# Patient Record
Sex: Female | Born: 1940 | Race: White | Hispanic: No | Marital: Married | Smoking: Never smoker
Health system: Southern US, Community
[De-identification: ages and names within clinical notes are randomized; demographics above are authoritative.]

## PROBLEM LIST (undated history)

## (undated) DIAGNOSIS — G20 Parkinson's disease: Principal | ICD-10-CM

## (undated) DIAGNOSIS — G20A1 Parkinson's disease without dyskinesia, without mention of fluctuations: Secondary | ICD-10-CM

## (undated) DIAGNOSIS — R609 Edema, unspecified: Secondary | ICD-10-CM

## (undated) DIAGNOSIS — M4800 Spinal stenosis, site unspecified: Secondary | ICD-10-CM

## (undated) DIAGNOSIS — R5383 Other fatigue: Secondary | ICD-10-CM

## (undated) DIAGNOSIS — N12 Tubulo-interstitial nephritis, not specified as acute or chronic: Secondary | ICD-10-CM

## (undated) DIAGNOSIS — I2789 Other specified pulmonary heart diseases: Secondary | ICD-10-CM

## (undated) DIAGNOSIS — R269 Unspecified abnormalities of gait and mobility: Secondary | ICD-10-CM

## (undated) DIAGNOSIS — M25619 Stiffness of unspecified shoulder, not elsewhere classified: Secondary | ICD-10-CM

## (undated) DIAGNOSIS — N189 Chronic kidney disease, unspecified: Secondary | ICD-10-CM

## (undated) DIAGNOSIS — G4730 Sleep apnea, unspecified: Secondary | ICD-10-CM

## (undated) HISTORY — DX: Unspecified abnormalities of gait and mobility: R26.9

## (undated) HISTORY — DX: Edema, unspecified: R60.9

## (undated) HISTORY — PX: ABDOMINAL HYSTERECTOMY: SHX81

## (undated) HISTORY — DX: Other specified pulmonary heart diseases: I27.89

## (undated) HISTORY — DX: Parkinson's disease without dyskinesia, without mention of fluctuations: G20.A1

## (undated) HISTORY — DX: Spinal stenosis, site unspecified: M48.00

## (undated) HISTORY — DX: Stiffness of unspecified shoulder, not elsewhere classified: M25.619

## (undated) HISTORY — DX: Parkinson's disease: G20

## (undated) HISTORY — DX: Other fatigue: R53.83

## (undated) HISTORY — PX: COLONOSCOPY: SHX174

---

## 2010-12-03 ENCOUNTER — Ambulatory Visit
Admission: RE | Admit: 2010-12-03 | Discharge: 2010-12-03 | Payer: Self-pay | Source: Home / Self Care | Attending: Otolaryngology | Admitting: Otolaryngology

## 2010-12-31 ENCOUNTER — Ambulatory Visit
Admission: RE | Admit: 2010-12-31 | Discharge: 2010-12-31 | Payer: Self-pay | Source: Home / Self Care | Attending: Otolaryngology | Admitting: Otolaryngology

## 2011-03-01 ENCOUNTER — Other Ambulatory Visit: Payer: Self-pay | Admitting: Neurology

## 2011-03-01 DIAGNOSIS — R259 Unspecified abnormal involuntary movements: Secondary | ICD-10-CM

## 2011-03-02 ENCOUNTER — Ambulatory Visit
Admission: RE | Admit: 2011-03-02 | Discharge: 2011-03-02 | Disposition: A | Discharge status: Home / Self Care | Payer: Medicare Other | Source: Ambulatory Visit | Attending: Neurology | Admitting: Neurology

## 2011-03-02 DIAGNOSIS — R259 Unspecified abnormal involuntary movements: Secondary | ICD-10-CM

## 2011-03-04 ENCOUNTER — Ambulatory Visit (INDEPENDENT_AMBULATORY_CARE_PROVIDER_SITE_OTHER): Payer: Medicare Other | Admitting: Otolaryngology

## 2011-03-04 DIAGNOSIS — K121 Other forms of stomatitis: Secondary | ICD-10-CM

## 2011-03-04 DIAGNOSIS — J310 Chronic rhinitis: Secondary | ICD-10-CM

## 2011-03-04 DIAGNOSIS — K1230 Oral mucositis (ulcerative), unspecified: Secondary | ICD-10-CM

## 2012-01-05 DIAGNOSIS — R609 Edema, unspecified: Secondary | ICD-10-CM

## 2012-01-17 ENCOUNTER — Encounter: Payer: Self-pay | Admitting: Cardiovascular Disease

## 2012-01-18 ENCOUNTER — Encounter: Payer: Self-pay | Admitting: Cardiovascular Disease

## 2012-01-18 ENCOUNTER — Ambulatory Visit (INDEPENDENT_AMBULATORY_CARE_PROVIDER_SITE_OTHER): Payer: Medicare Other | Admitting: Cardiovascular Disease

## 2012-01-18 VITALS — BP 149/79 | HR 76 | Ht 65.0 in | Wt 192.0 lb

## 2012-01-18 DIAGNOSIS — I498 Other specified cardiac arrhythmias: Secondary | ICD-10-CM

## 2012-01-18 DIAGNOSIS — R609 Edema, unspecified: Secondary | ICD-10-CM

## 2012-01-18 DIAGNOSIS — R001 Bradycardia, unspecified: Secondary | ICD-10-CM

## 2012-01-18 DIAGNOSIS — Z136 Encounter for screening for cardiovascular disorders: Secondary | ICD-10-CM

## 2012-01-18 NOTE — Progress Notes (Signed)
HPI  This is a 71 year old female who was referred by Dr. Dimas Aguas for evaluation of bradycardia and lower extremity edema. The patient has no previous cardiac history. She has no history of myocardial infarction or congestive heart failure. She has a prolonged history of Parkinson's disease with significant physical limitations related to that. She also has history of chronic back pain due to spinal stenosis which also significantly contributes to her physical limitations. She suffers from chronic spasm and jerky movements in her lower extremities which have been very difficult to deal with and treat. She has been tried on multiple medications but had side effects almost all of them. Most recently she was given Tizanidine which is a muscle relaxant. She took a few doses but yesterday after she took one dose her blood pressure dropped to systolic of 95. This was associated with confusion and slurring in her speech. She also had few episodes of bradycardia with heart rate in the lower 50s. She complains of occasional dizziness but there has been no syncope or presyncope. She has chronic lower extremity edema which is usually worse at the end of the day. She denies any chest pain or dyspnea.  Allergies  Allergen Reactions  . Erythromycin Diarrhea     Current Outpatient Prescriptions on File Prior to Visit  Medication Sig Dispense Refill  . Calcium Carbonate-Vitamin D (CALTRATE 600+D) 600-400 MG-UNIT per tablet Take 1 tablet by mouth daily.      . carbidopa-levodopa (SINEMET) 25-100 MG per tablet Take 1 tablet by mouth 4 (four) times daily.       . hydrochlorothiazide (MICROZIDE) 12.5 MG capsule Take 12.5 mg by mouth daily as needed.      Marland Kitchen HYDROcodone-acetaminophen (VICODIN) 5-500 MG per tablet Take 1 tablet by mouth every 8 (eight) hours as needed.       . magnesium oxide (MAG-OX) 400 MG tablet Take 400 mg by mouth daily.      . Multiple Vitamins-Minerals (CENTRUM SILVER PO) Take 1 tablet by mouth  daily.      . polyethylene glycol (MIRALAX / GLYCOLAX) packet Take 17 g by mouth daily.         Past Medical History  Diagnosis Date  . Edema   . Other chronic pulmonary heart diseases   . Paralysis agitans   . Stiffness of joint, not elsewhere classified,  shoulder region   . Abnormality of gait   . Fatigue   . Parkinson disease   . Spinal stenosis      History reviewed. No pertinent past surgical history.   History reviewed. No pertinent family history.   History   Social History  . Marital Status: Married    Spouse Name: Homero Fellers    Number of Children: N/A  . Years of Education: N/A   Occupational History  . RETIRED    Social History Main Topics  . Smoking status: Never Smoker   . Smokeless tobacco: Never Used  . Alcohol Use: No  . Drug Use: No  . Sexually Active: Not on file   Other Topics Concern  . Not on file   Social History Narrative  . No narrative on file     ROS Constitutional: Negative for fever, chills, diaphoresis, activity change, appetite change. HENT: Negative for hearing loss, nosebleeds, congestion, sore throat, facial swelling, drooling, trouble swallowing, neck pain, voice change, sinus pressure and tinnitus.  Eyes: Negative for photophobia, pain, discharge and visual disturbance.  Respiratory: Negative for apnea, cough, chest tightness, shortness of breath  and wheezing.  Cardiovascular: Negative for chest pain, palpitations.  Gastrointestinal: Negative for nausea, vomiting, abdominal pain, diarrhea, constipation, blood in stool and abdominal distention.  Genitourinary: Negative for dysuria, urgency, frequency, hematuria and decreased urine volume.   Skin: Negative for color change, pallor, rash and wound.  Neurological: Negative for  seizures, syncope, speech difficulty, weakness. Psychiatric/Behavioral: Negative for suicidal ideas, hallucinations, behavioral problems and agitation.     PHYSICAL EXAM   BP 149/79  Pulse 76  Ht 5'  5" (1.651 m)  Wt 192 lb (87.091 kg)  BMI 31.95 kg/m2  SpO2 96%  Constitutional: She is oriented to person, place, and time. She appears well-developed and well-nourished. No distress.  HENT: No nasal discharge.  Head: Normocephalic and atraumatic.  Eyes: Pupils are equal and round. Right eye exhibits no discharge. Left eye exhibits no discharge.  Neck: Normal range of motion. Neck supple. No JVD present. No thyromegaly present.  Cardiovascular: Normal rate, regular rhythm, normal heart sounds. Exam reveals no gallop and no friction rub. There is a 2/6 systolic ejection murmur at the base. Pulmonary/Chest: Effort normal and breath sounds normal. No stridor. No respiratory distress. She has no wheezes. She has no rales. She exhibits no tenderness.  Abdominal: Soft. Bowel sounds are normal. She exhibits no distension. There is no tenderness. There is no rebound and no guarding.  Musculoskeletal: Normal range of motion. She exhibits +2 edema and no tenderness.  Neurological: She is alert and oriented to person, place, and time. Coordination normal.  Skin: Skin is warm and dry. No rash noted. She is not diaphoretic. No erythema. No pallor.  Psychiatric: She has a normal mood and affect. Her behavior is normal. Judgment and thought content normal.    EKG: Sinus rhythm with significant motion artifact and occasional PACs. No significant ST or T wave changes.   ASSESSMENT AND PLAN

## 2012-01-18 NOTE — Assessment & Plan Note (Signed)
Her lower extremity edema is likely due to chronic venous insufficiency. I agree with support stockings and frequent leg elevations. She uses hydrochlorothiazide as needed. Can consider switching back to a small dose furosemide to see if it works better without dropping her blood pressure.

## 2012-01-18 NOTE — Patient Instructions (Signed)
Your follow up will be based on your test results.  Your physician recommends that you continue on your current medications as directed. Please refer to the Current Medication list given to you today. Your physician has recommended that you wear a holter monitor. Holter monitors are medical devices that record the heart's electrical activity. Doctors most often use these monitors to diagnose arrhythmias. Arrhythmias are problems with the speed or rhythm of the heartbeat. The monitor is a small, portable device. You can wear one while you do your normal daily activities. This is usually used to diagnose what is causing palpitations/syncope (passing out). If the results of your test are normal or stable, you will receive a letter. If they are abnormal, the nurse will contact you by phone.  

## 2012-01-18 NOTE — Assessment & Plan Note (Signed)
The patient had reported bradycardia in the low 50s with occasional dizziness. There is no history of syncope or presyncope. She is not bradycardic today. This could be reactive due to some of the medications that she takes especially the muscle relaxant. I will obtain a 48-hour Holter monitor for further evaluation. I also recommend checking her thyroid function and comprehensive metabolic profile if this has not been done recently. She did have an echocardiogram done which showed normal LV systolic function with only minor valvular abnormalities. She does not have symptoms suggestive of cardiac ischemia. Most of her complaints and physical limitations seems to be related to Parkinson's disease and back problems.

## 2012-01-25 ENCOUNTER — Encounter: Payer: Self-pay | Admitting: *Deleted

## 2012-02-29 ENCOUNTER — Other Ambulatory Visit: Payer: Self-pay | Admitting: Neurology

## 2012-02-29 DIAGNOSIS — IMO0002 Reserved for concepts with insufficient information to code with codable children: Secondary | ICD-10-CM

## 2012-02-29 DIAGNOSIS — M25559 Pain in unspecified hip: Secondary | ICD-10-CM

## 2012-03-03 ENCOUNTER — Ambulatory Visit
Admission: RE | Admit: 2012-03-03 | Discharge: 2012-03-03 | Disposition: A | Discharge status: Home / Self Care | Payer: Medicare Other | Source: Ambulatory Visit | Attending: Neurology | Admitting: Neurology

## 2012-03-03 DIAGNOSIS — IMO0002 Reserved for concepts with insufficient information to code with codable children: Secondary | ICD-10-CM

## 2012-03-03 DIAGNOSIS — M25559 Pain in unspecified hip: Secondary | ICD-10-CM

## 2012-03-07 ENCOUNTER — Other Ambulatory Visit: Payer: Medicare Other

## 2012-04-19 ENCOUNTER — Encounter (HOSPITAL_COMMUNITY): Payer: Self-pay

## 2012-04-19 ENCOUNTER — Emergency Department (HOSPITAL_COMMUNITY): Payer: Medicare Other

## 2012-04-19 ENCOUNTER — Emergency Department (HOSPITAL_COMMUNITY)
Admission: EM | Admit: 2012-04-19 | Discharge: 2012-04-19 | Disposition: A | Discharge status: Home / Self Care | Payer: Medicare Other | Attending: Emergency Medicine | Admitting: Emergency Medicine

## 2012-04-19 DIAGNOSIS — M79609 Pain in unspecified limb: Secondary | ICD-10-CM | POA: Insufficient documentation

## 2012-04-19 DIAGNOSIS — G20 Parkinson's disease: Secondary | ICD-10-CM | POA: Insufficient documentation

## 2012-04-19 DIAGNOSIS — G20A1 Parkinson's disease without dyskinesia, without mention of fluctuations: Secondary | ICD-10-CM | POA: Insufficient documentation

## 2012-04-19 DIAGNOSIS — Z79899 Other long term (current) drug therapy: Secondary | ICD-10-CM | POA: Insufficient documentation

## 2012-04-19 DIAGNOSIS — R29898 Other symptoms and signs involving the musculoskeletal system: Secondary | ICD-10-CM | POA: Insufficient documentation

## 2012-04-19 DIAGNOSIS — M545 Low back pain, unspecified: Secondary | ICD-10-CM | POA: Insufficient documentation

## 2012-04-19 DIAGNOSIS — R609 Edema, unspecified: Secondary | ICD-10-CM | POA: Insufficient documentation

## 2012-04-19 LAB — BASIC METABOLIC PANEL
BUN: 15 mg/dL (ref 6–23)
CO2: 29 mEq/L (ref 19–32)
Calcium: 9.7 mg/dL (ref 8.4–10.5)
Creatinine, Ser: 0.55 mg/dL (ref 0.50–1.10)
Glucose, Bld: 91 mg/dL (ref 70–99)

## 2012-04-19 LAB — CBC
MCH: 33 pg (ref 26.0–34.0)
MCV: 94.2 fL (ref 78.0–100.0)
Platelets: 192 10*3/uL (ref 150–400)
RDW: 12.5 % (ref 11.5–15.5)

## 2012-04-19 LAB — URINE MICROSCOPIC-ADD ON

## 2012-04-19 LAB — URINALYSIS, ROUTINE W REFLEX MICROSCOPIC
Bilirubin Urine: NEGATIVE
Glucose, UA: NEGATIVE mg/dL
Hgb urine dipstick: NEGATIVE
Ketones, ur: NEGATIVE mg/dL
Protein, ur: NEGATIVE mg/dL

## 2012-04-19 MED ORDER — DIAZEPAM 5 MG PO TABS
5.0000 mg | ORAL_TABLET | Freq: Once | ORAL | Status: AC
  Administered 2012-04-19: 5 mg via ORAL
  Filled 2012-04-19: qty 1

## 2012-04-19 MED ORDER — FENTANYL CITRATE 0.05 MG/ML IJ SOLN
50.0000 ug | Freq: Once | INTRAMUSCULAR | Status: AC
  Administered 2012-04-19: 50 ug via INTRAMUSCULAR
  Filled 2012-04-19: qty 2

## 2012-04-19 MED ORDER — ONDANSETRON 4 MG PO TBDP
4.0000 mg | ORAL_TABLET | Freq: Once | ORAL | Status: AC
  Administered 2012-04-19: 4 mg via ORAL
  Filled 2012-04-19: qty 1

## 2012-04-19 MED ORDER — DIAZEPAM 5 MG PO TABS: 5.0000 mg | ORAL_TABLET | Freq: Two times a day (BID) | ORAL | Status: AC

## 2012-04-19 NOTE — ED Provider Notes (Signed)
History     CSN: [id number]  Arrival date & time 04/19/12  1142   First MD Initiated Contact with Patient 04/19/12 1217      Chief Complaint  Patient presents with  . Extremity Weakness    (Consider location/radiation/quality/duration/timing/severity/associated sxs/prior treatment) HPI History from patient. 71yo F with PMH Parkinson disease who presents with multiple complaints. States that she has had intermittent "spasms" to her bilateral lower extremities for the past 2 years. Spasms are associated with intense cramping pain, which can last for up to 5 minutes at a time. States she has difficulty with walking at times due to the pain and "my legs feel like jelly." Pain worsens with movement. She has peripheral edema which has been ongoing for some time. Has been seen by her neurologist, Dr. Sandria Manly, and her PCP numerous times; has tried numerous medications for this with no relief. Currently taking hydrocodone for pain.  A review of the previous chart indicates that she has been evaluated for this with dopplers of the LEs which was negative. MR of the Lspine was performed which showed extensive spondylosis and sciatica with several areas of foraminal narrowing.  Past Medical History  Diagnosis Date  . Edema   . Other chronic pulmonary heart diseases   . Paralysis agitans   . Stiffness of joint, not elsewhere classified,  shoulder region   . Abnormality of gait   . Fatigue   . Parkinson disease   . Spinal stenosis     History reviewed. No pertinent past surgical history.  History reviewed. No pertinent family history.  History  Substance Use Topics  . Smoking status: Never Smoker   . Smokeless tobacco: Never Used  . Alcohol Use: No    OB History    Grav Para Term Preterm Abortions TAB SAB Ect Mult Living                  Review of Systems  Constitutional: Negative for fever, chills, activity change and appetite change.  Respiratory: Negative for chest tightness and  shortness of breath.   Cardiovascular: Negative for chest pain and palpitations.  Gastrointestinal: Negative for nausea, vomiting and abdominal pain.  Musculoskeletal: Negative for myalgias.  Skin: Negative for color change and rash.  Neurological: Negative for dizziness and weakness.  All other systems reviewed and are negative.    Allergies  Tizanidine and Erythromycin  Home Medications   Current Outpatient Rx  Name Route Sig Dispense Refill  . CALCIUM CARBONATE-VITAMIN D 600-400 MG-UNIT PO TABS Oral Take 1 tablet by mouth daily.    Marland Kitchen CARBIDOPA-LEVODOPA 25-100 MG PO TABS Oral Take 1 tablet by mouth 5 (five) times daily. 5am, 9am, noon, 3pm, 7pm    . VITAMIN D3 5000 UNITS PO CAPS Oral Take 1 capsule by mouth daily.    Marland Kitchen CITALOPRAM HYDROBROMIDE 10 MG PO TABS Oral Take 10 mg by mouth daily.    . OMEGA-3 FATTY ACIDS 1000 MG PO CAPS Oral Take 1 g by mouth daily.    . FUROSEMIDE 40 MG PO TABS Oral Take 40 mg by mouth every other day. Alternates with hctz    . HYDROCHLOROTHIAZIDE 12.5 MG PO CAPS Oral Take 12.5 mg by mouth every other day. Alternates with furosemide    . HYDROCODONE-ACETAMINOPHEN 5-500 MG PO TABS Oral Take 1 tablet by mouth every 8 (eight) hours as needed. For pain    . MAGNESIUM OXIDE 400 MG PO TABS Oral Take 400 mg by mouth daily.    Marland Kitchen  CENTRUM SILVER PO Oral Take 1 tablet by mouth daily.    Marland Kitchen POLYETHYLENE GLYCOL 3350 PO PACK Oral Take 17 g by mouth daily.    . TRAMADOL HCL 50 MG PO TABS Oral Take 50 mg by mouth 3 (three) times daily as needed. For pain    . TRIHEXYPHENIDYL HCL 2 MG PO TABS Oral Take 1 mg by mouth 3 (three) times daily with meals.    Marland Kitchen VITAMIN E 400 UNITS PO CAPS Oral Take 400 Units by mouth daily.      BP 143/59  Pulse 61  Temp(Src) 98.2 F (36.8 C) (Oral)  Resp 18  Physical Exam  Nursing note and vitals reviewed. Constitutional: She is oriented to person, place, and time. She appears well-developed and well-nourished. No distress.  HENT:    Head: Normocephalic and atraumatic.  Neck: Normal range of motion. Neck supple.  Cardiovascular: Normal rate, regular rhythm and normal heart sounds.   Pulmonary/Chest: Effort normal and breath sounds normal. She exhibits no tenderness.  Abdominal: Soft. Bowel sounds are normal. There is no tenderness. There is no rebound and no guarding.  Musculoskeletal: Normal range of motion.       No muscle spasm appreciated in legs or tenderness to palpation of legs. No palpable cord or pain with dorsiflexion.   Neurological: She is alert and oriented to person, place, and time. She has normal strength. No cranial nerve deficit or sensory deficit. She exhibits normal muscle tone. Coordination normal.  Reflex Scores:      Achilles reflexes are 1+ on the right side and 1+ on the left side. Skin: Skin is warm and dry. She is not diaphoretic.       Chronic edema and skin changes consistent with venous insufficiency  Psychiatric: She has a normal mood and affect.    ED Course  Procedures (including critical care time)  Labs Reviewed  URINALYSIS, ROUTINE W REFLEX MICROSCOPIC - Abnormal; Notable for the following:    Leukocytes, UA SMALL (*)    All other components within normal limits  BASIC METABOLIC PANEL  CBC  URINE MICROSCOPIC-ADD ON  URINE CULTURE   Ct Head Wo Contrast  04/19/2012  *RADIOLOGY REPORT*  Clinical Data: Bilateral leg weakness and pain.  No headache.  CT HEAD WITHOUT CONTRAST  Technique:  Contiguous axial images were obtained from the base of the skull through the vertex without contrast.  Comparison: None  Findings: Ventricle size is normal.  Negative for hemorrhage. Small hypodensity in the left frontal white matter, most likely related to chronic microvascular ischemia.  No acute infarct. Negative for mass lesion.  Calvarium is intact.  IMPRESSION: Small hypodensity left frontal white matter, likely an area of chronic ischemia.  No acute abnormality.  Original Report Authenticated  By: Camelia Phenes, M.D.   Gna Rad Results  04/18/2012  Ordered by an unspecified provider.     1. Low back pain radiating to both legs       MDM  Pt with bilateral pain and "spasm" to lower extremities. Has been seen by his PCP and neurologist and has tried several medications with no relief. Basic labs unremarkable here. CT head negative. Old chart examined which indicates extensive spondylosis with both right and left foraminal narrowing, so this certainly may represent radicular pain as she states it often gets worse with movements. She was given 1 dose Fentanyl and Valium here and stated she felt better with this. She was provided with referrals to pain management both in  [name] where she lives as well as at [facility]. Emphasized the importance of f/u with them. She hasn't tried Valium before so will give her a prescription for this; advised that this can raise risk of falls so recommended starting with 1/2 pill at first. She and family at bedside verbalized understanding, agreed to plan.  Plan d/w Dr. Clarene Duke.        Grant Fontana, [place] 04/19/12 [phone]

## 2012-04-19 NOTE — Discharge Instructions (Signed)
Your lab evaluation and CT here were normal. You have been given a prescription for Valium, a muscle relaxer. Please start with half a pill at a time. You may advance up to a whole pill once you see how the medication affects you. It is important that you follow up with pain management, as your pain may be coming from your back. Please see the attached referral information. Follow up with Dr. Sandria Manly as well. Return to the ED for worsening condition or other concerns.  Back Pain, Adult Low back pain is very common. About 1 in 5 people have back pain.The cause of low back pain is rarely dangerous. The pain often gets better over time.About half of people with a sudden onset of back pain feel better in just 2 weeks. About 8 in 10 people feel better by 6 weeks.  CAUSES Some common causes of back pain include:  Strain of the muscles or ligaments supporting the spine.   Wear and tear (degeneration) of the spinal discs.   Arthritis.   Direct injury to the back.  DIAGNOSIS Most of the time, the direct cause of low back pain is not known.However, back pain can be treated effectively even when the exact cause of the pain is unknown.Answering your caregiver's questions about your overall health and symptoms is one of the most accurate ways to make sure the cause of your pain is not dangerous. If your caregiver needs more information, he or she may order lab work or imaging tests (X-rays or MRIs).However, even if imaging tests show changes in your back, this usually does not require surgery. HOME CARE INSTRUCTIONS For many people, back pain returns.Since low back pain is rarely dangerous, it is often a condition that people can learn to [facility] their own.   Remain active. It is stressful on the back to sit or stand in one place. Do not sit, drive, or stand in one place for more than 30 minutes at a time. Take short walks on level surfaces as soon as pain allows.Try to increase the length of time you  walk each day.   Do not stay in bed.Resting more than 1 or 2 days can delay your recovery.   Do not avoid exercise or work.Your body is made to move.It is not dangerous to be active, even though your back may hurt.Your back will likely heal faster if you return to being active before your pain is gone.   Pay attention to your body when you bend and lift. Many people have less discomfortwhen lifting if they bend their knees, keep the load close to their bodies,and avoid twisting. Often, the most comfortable positions are those that put less stress on your recovering back.   Find a comfortable position to sleep. Use a firm mattress and lie on your side with your knees slightly bent. If you lie on your back, put a pillow under your knees.   Only take over-the-counter or prescription medicines as directed by your caregiver. Over-the-counter medicines to reduce pain and inflammation are often the most helpful.Your caregiver may prescribe muscle relaxant drugs.These medicines help dull your pain so you can more quickly return to your normal activities and healthy exercise.   Put ice on the injured area.   Put ice in a plastic bag.   Place a towel between your skin and the bag.   Leave the ice on for 15 to 20 minutes, 3 to 4 times a day for the first 2 to 3 days.  After that, ice and heat may be alternated to reduce pain and spasms.   Ask your caregiver about trying back exercises and gentle massage. This may be of some benefit.   Avoid feeling anxious or stressed.Stress increases muscle tension and can worsen back pain.It is important to recognize when you are anxious or stressed and learn ways to manage it.Exercise is a great option.  SEEK MEDICAL CARE IF:  You have pain that is not relieved with rest or medicine.   You have pain that does not improve in 1 week.   You have new symptoms.   You are generally not feeling well.  SEEK IMMEDIATE MEDICAL CARE IF:   You have pain that  radiates from your back into your legs.   You develop new bowel or bladder control problems.   You have unusual weakness or numbness in your arms or legs.   You develop nausea or vomiting.   You develop abdominal pain.   You feel faint.  Document Released: 11/22/2005 Document Revised: 11/11/2011 Document Reviewed: 04/12/2011 [facility] Patient Information 2012 [place], [place].

## 2012-04-19 NOTE — ED Notes (Signed)
Ongoing leg weakness sts legs feel like jelly, shaking and complains of fluid pockets behind knees

## 2012-04-19 NOTE — ED Notes (Signed)
Patient stated she is unable to change position to do orthostatic vital signs

## 2012-04-21 NOTE — ED Provider Notes (Signed)
Medical screening examination/treatment/procedure(s) were performed by non-physician practitioner and as supervising physician I was immediately available for consultation/collaboration.   Cedricka Sackrider M Dhilan Brauer, DO 04/21/12 2339 

## 2012-04-22 LAB — URINE CULTURE: Culture  Setup Time: 201305151619

## 2012-04-23 NOTE — ED Notes (Signed)
+   urine culture. Chart sent to EDP office for review 

## 2012-04-25 NOTE — ED Notes (Signed)
Rx for Cipro 250 mg po BID x 7 days called to [facility] ,[facility]  [phone] written by Trixie Dredge

## 2013-02-13 IMAGING — CT CT HEAD W/O CM
1 of 2 series · 16 of 30 positions shown, 20 images · non-contrast
Comparison: None

CLINICAL DATA: Bilateral leg weakness and pain.  No headache.

CT HEAD WITHOUT CONTRAST
TECHNIQUE: Contiguous axial images were obtained from the base of
the skull through the vertex without contrast.

[Series 2: (id) head 4.8 h37s st · axial · 0.47mm/px · z∈[+1244,+1396]mm · 16 of 36 slices shown, 20 images]
[im 2/36  brain]
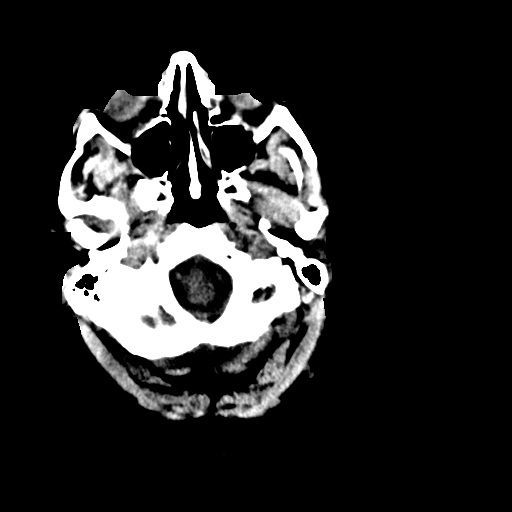
[im 2/36  bone]
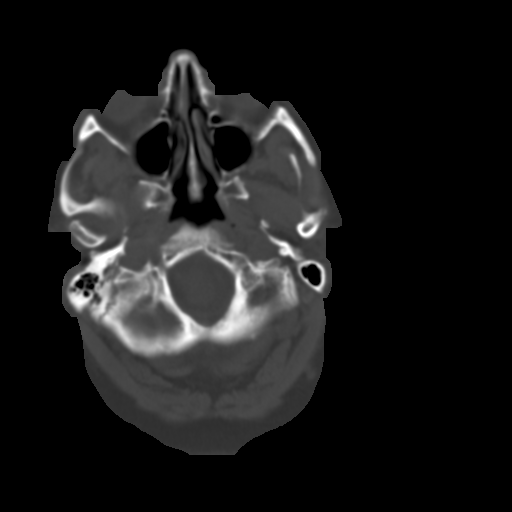
[im 4/36  brain]
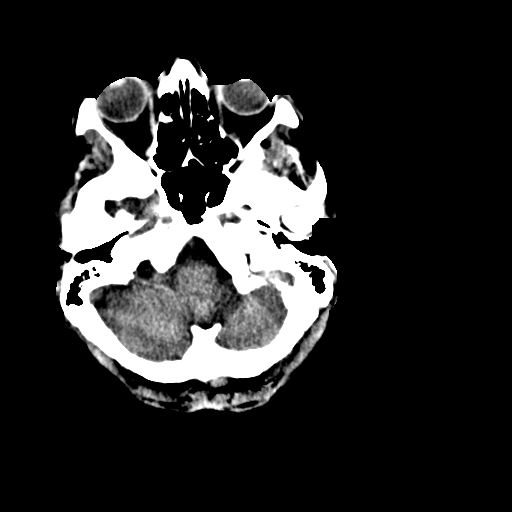
[im 7/36  brain]
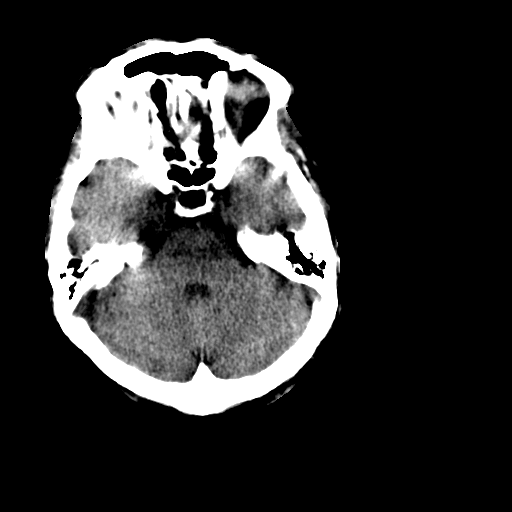
[im 9/36  brain]
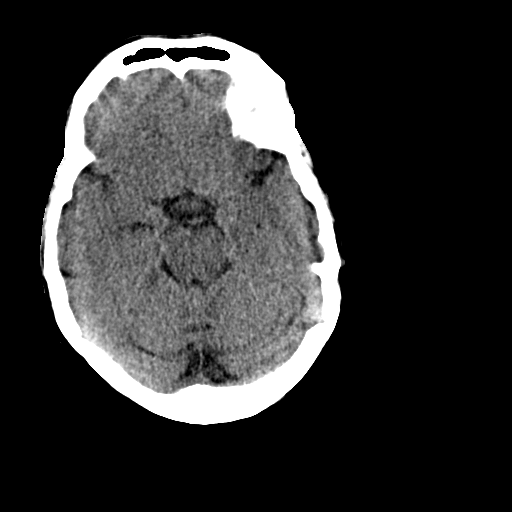
[im 11/36  brain]
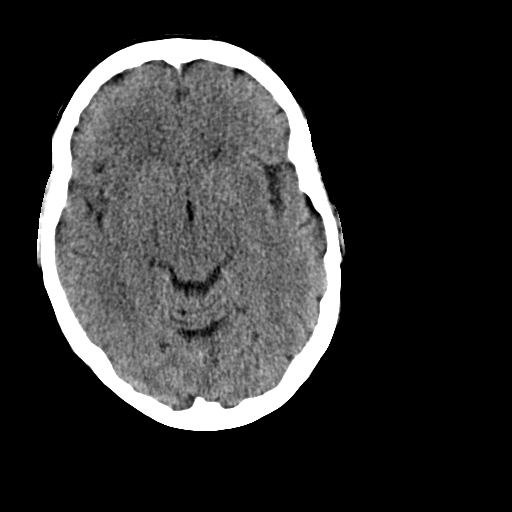
[im 11/36  bone]
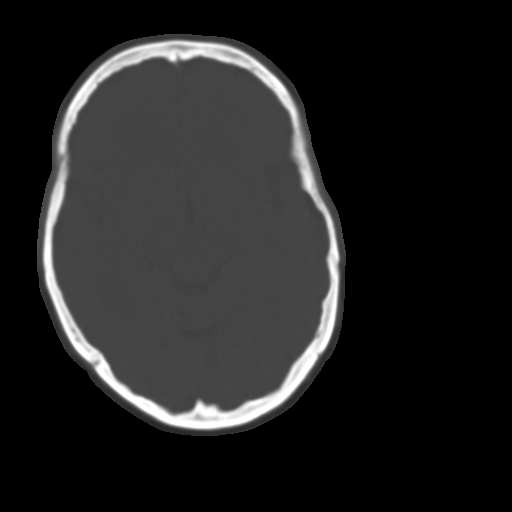
[im 12/36  brain]
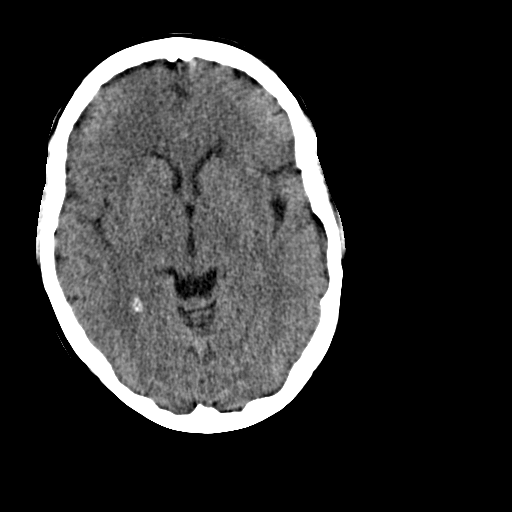
[im 16/36  brain]
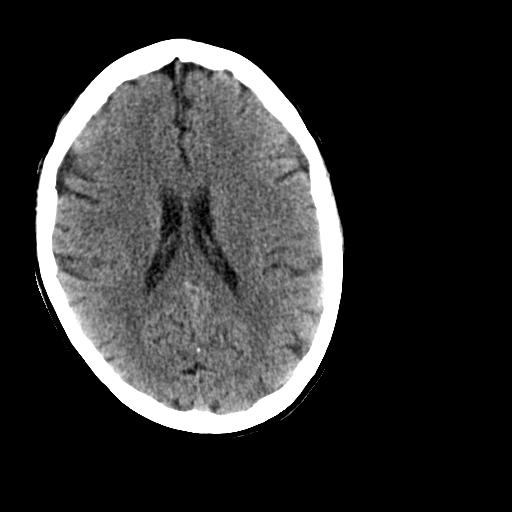
[im 17/36  brain]
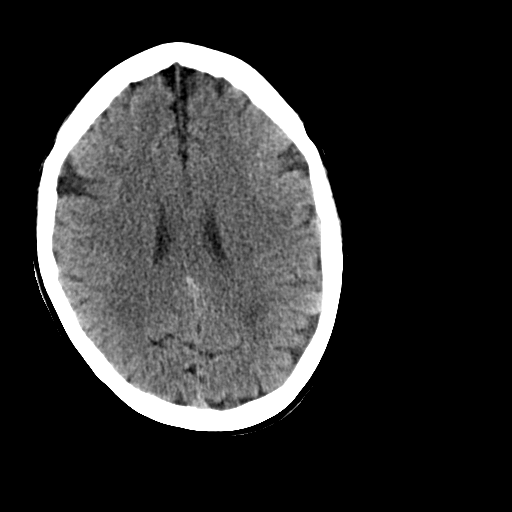
[im 19/36  brain]
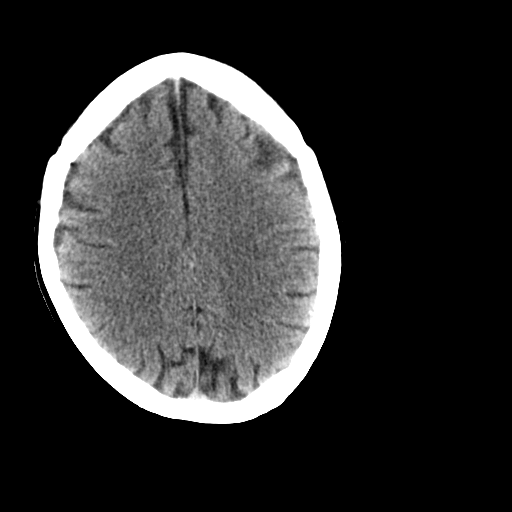
[im 19/36  bone]
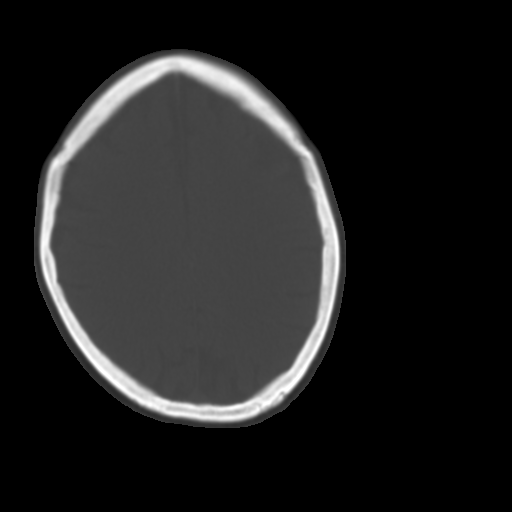
[im 21/36  brain]
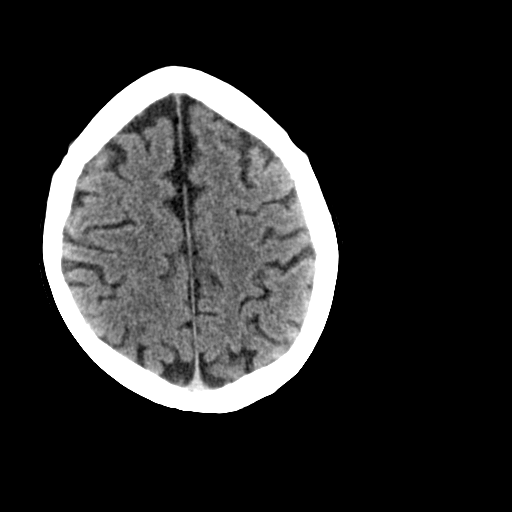
[im 24/36  brain]
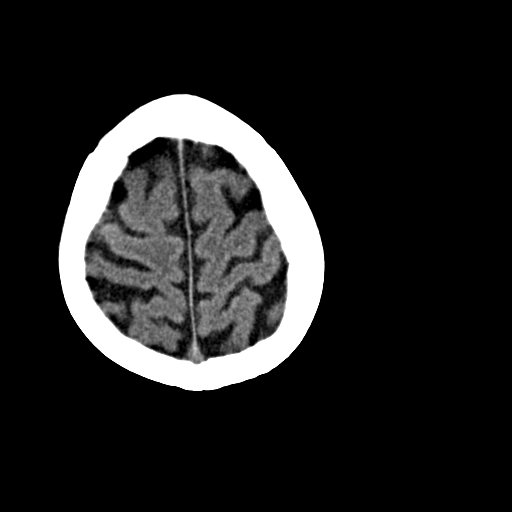
[im 26/36  brain]
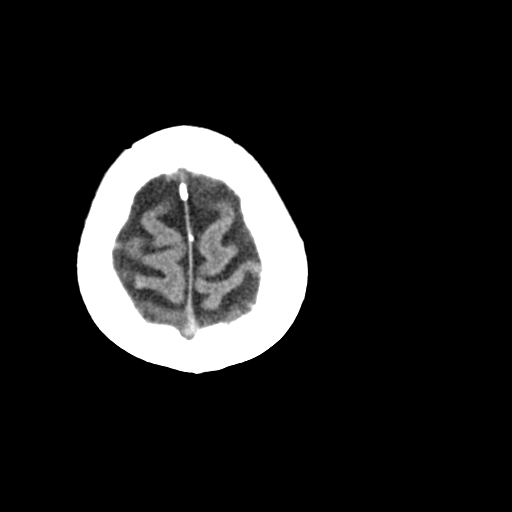
[im 27/36  brain]
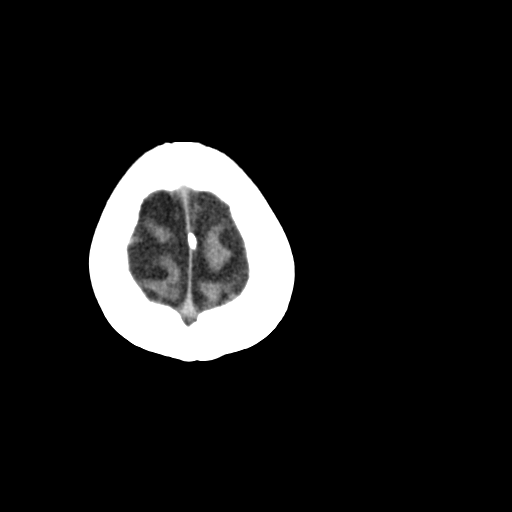
[im 27/36  bone]
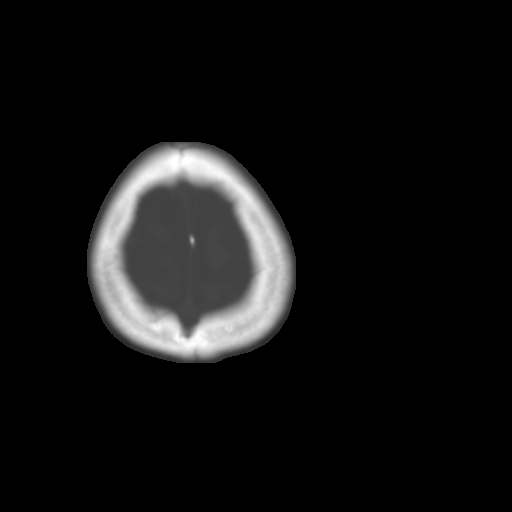
[im 29/36  brain]
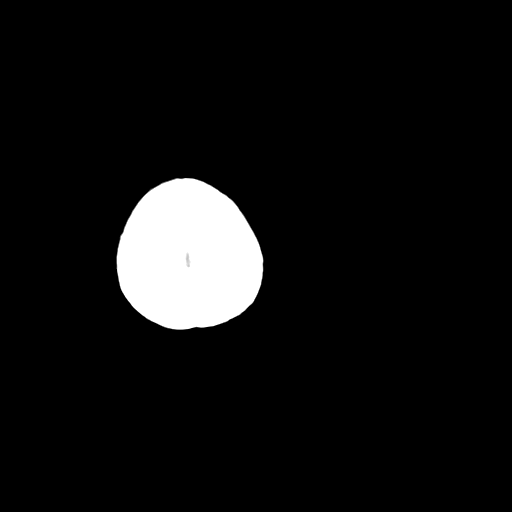
[im 32/36  brain]
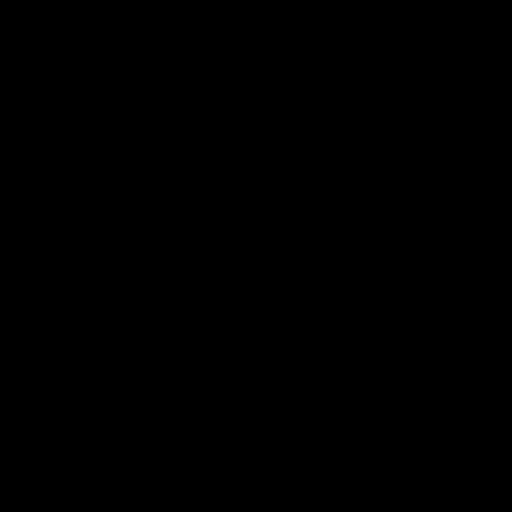
[im 34/36  brain]
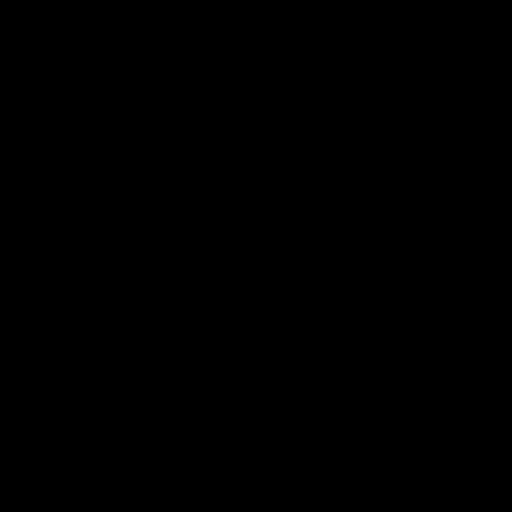

[16 of 30 positions shown; findings below may reference images not displayed]

FINDINGS: Ventricle size is normal.  Negative for hemorrhage.
Small hypodensity in the left frontal white matter, most likely
related to chronic microvascular ischemia.  No acute infarct.
Negative for mass lesion.  Calvarium is intact.
IMPRESSION: Small hypodensity left frontal white matter, likely an area of
chronic ischemia.  No acute abnormality.

## 2013-03-01 ENCOUNTER — Telehealth: Payer: Self-pay | Admitting: *Deleted

## 2013-03-01 NOTE — Telephone Encounter (Signed)
Burgess Estelle (Speach Pathologist) with [facility] wants to continue pt's speech therapy sessions (2x per week) for 4 more weeks.  You have not seen this pt yet - appt in May.  I left a message to this effect on Ms Stone's vm.  Ms Stone's vm is confidential.  S

## 2013-05-01 ENCOUNTER — Ambulatory Visit (INDEPENDENT_AMBULATORY_CARE_PROVIDER_SITE_OTHER): Payer: Medicare Other | Admitting: Neurology

## 2013-05-01 ENCOUNTER — Encounter: Payer: Self-pay | Admitting: Neurology

## 2013-05-01 VITALS — BP 148/84 | HR 87 | Temp 97.5°F | Ht 64.0 in | Wt 192.0 lb

## 2013-05-01 DIAGNOSIS — G20 Parkinson's disease: Secondary | ICD-10-CM

## 2013-05-01 MED ORDER — CARBIDOPA-LEVODOPA 25-100 MG PO TABS: ORAL_TABLET | ORAL | Status: DC

## 2013-05-01 NOTE — Patient Instructions (Addendum)
I think overall you are doing fairly well but I do want to suggest a few things today:  Remember to drink plenty of fluid, eat healthy meals and do not skip any meals. Try to eat protein with a every meal and eat a healthy snack such as fruit or nuts in between meals. Try to keep a regular sleep-wake schedule and try to exercise daily, particularly in the form of walking, 20-30 minutes a day, if you can.   As far as your medications are concerned, I would like to suggest taking carbidopa-levodopa 1 1/2 pills five times a day.   As far as diagnostic testing: no new test.  I would like to see you back in 3 months, sooner if we need to. Please call us with any interim questions, concerns, problems, updates or refill requests.  Please also call us for any test results so we can go over those with you on the phone. Brett Canales is my clinical assistant and will answer any of your questions and relay your messages to me and also relay most of my messages to you.  Our phone number is [phone]. We also have an after hours call service for urgent matters and there is a physician on-call for urgent questions. For any emergencies you know to call 911 or go to the nearest emergency room.

## 2013-05-01 NOTE — Progress Notes (Signed)
Subjective:    Patient ID: Carol Mckee is a 72 y.o. female.  HPI  Interim history:   Carol Mckee is a very pleasant 72 year old ambidextrous female who presents for followup consultation of her Parkinson's disease. She is unaccompanied today. This is her first visit with me and she previously followed with Dr. 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 He also increased her carbidopa-levodopa 1-1/2 pills at 5 AM, one pill at 9 AM, one pill at 1 PM, 1 1/2 pills at 5 PM and one pill at 9 PM. Her falls assessment tool score at the time was 18. She has an underlying medical history of lumbar spine stenosis, hip pain, status post hiatal hernia surgery, kidney stone surgery, bladder surgery, hysterectomy, breast biopsy, tonsillectomy and adenoidectomy. She is currently on carbidopa-levodopa as described above, Cymbalta 30 mg daily, baclofen 10 mg twice daily, nitrofurantoin daily, diazepam 5 mg twice daily, magnesium, multivitamin, MiraLax, vitamin E, vitamin D.  I reviewed Dr. Imagene Gurney prior notes and the patient's records and below is a summary of that review:  72 year old woman with a approximately ten-year history of Parkinson's disease treated with carbidopa-levodopa 25-100 mg strength 5 times daily. She developed lower back pain and was treated with hydrocodone, oxycodone and fentanyl patch. She also received epidural steroid injections. She had an EEG in May 2011. This did not show any epileptiform discharges. MRI L-spine and March 2013 showed scoliosis, severe degenerative joint disease. She has had jerking movements in her legs. This does not occur while lying down but mostly with sitting or standing. She has developed bladder incontinence. In January 2014 her MMSE was 22, clock drawing was 4, animal fluency was 8.  She has a hospital bed with rails up at night. He helps  her to use a bed pan. She has to urinate 2-5 times per night. She has had a hospital bed for the past 6-8 months. She can walk about 15 feet at a time with 2 wheeled walker. She had PT, but not recently. She had ST up until a couple of weeks ago. She denies VH or AH and has not had dyskinesias.   Her Past Medical History Is Significant For: Past Medical History  Diagnosis Date  . Edema   . Other chronic pulmonary heart diseases   . Paralysis agitans   . Stiffness of joint, not elsewhere classified,  shoulder region   . Abnormality of gait   . Fatigue   . Parkinson disease   . Spinal stenosis     Her Past Surgical History Is Significant For: History reviewed. No pertinent past surgical history.  Her Family History Is Significant For: History reviewed. No pertinent family history.  Her Social History Is Significant For: History   Social History  . Marital Status: Married    Spouse Name: Homero Fellers    Number of Children: N/A  . Years of Education: N/A   Occupational History  . RETIRED    Social History Main Topics  . Smoking status: Never Smoker   . Smokeless tobacco: Never Used  . Alcohol Use: No  . Drug Use: No  . Sexually Active: None   Other Topics Concern  . None   Social History Narrative  . None    Her Allergies Are:  Allergies  Allergen Reactions  . Tizanidine Other (See Comments)    hypotension  .  Erythromycin Diarrhea  :   Her Current Medications Are:  Outpatient Encounter Prescriptions as of 05/01/2013  Medication Sig Dispense Refill  . baclofen (LIORESAL) 10 MG tablet       . Calcium Carbonate-Vitamin D (CALTRATE 600+D) 600-400 MG-UNIT per tablet Take 1 tablet by mouth daily.      . carbidopa-levodopa (SINEMET IR) 25-100 MG per tablet Take 1 1/2 pills at 5 am, 9am, 1 pm, 5 pm and 9 pm  720 tablet  3  . Cholecalciferol (VITAMIN D3) 5000 UNITS CAPS Take 1 capsule by mouth daily.      . diazepam (VALIUM) 5 MG tablet       . DULoxetine (CYMBALTA) 30 MG  capsule       . HYDROcodone-acetaminophen (NORCO/VICODIN) 5-325 MG per tablet Take 1 tablet by mouth every 4 (four) hours as needed for pain.      . magnesium oxide (MAG-OX) 400 MG tablet Take 400 mg by mouth daily.      . Multiple Vitamins-Minerals (CENTRUM SILVER PO) Take 1 tablet by mouth daily.      . polyethylene glycol (MIRALAX / GLYCOLAX) packet Take 17 g by mouth daily.      . [DISCONTINUED] carbidopa-levodopa (SINEMET) 25-100 MG per tablet Take 1 tablet by mouth 5 (five) times daily. 5am, 9am, noon, 3pm, 7pm      . cephALEXin (KEFLEX) 500 MG capsule       . citalopram (CELEXA) 10 MG tablet Take 10 mg by mouth daily.      . fish oil-omega-3 fatty acids 1000 MG capsule Take 1 g by mouth daily.      . furosemide (LASIX) 40 MG tablet Take 40 mg by mouth every other day. Alternates with hctz      . hydrochlorothiazide (MICROZIDE) 12.5 MG capsule Take 12.5 mg by mouth every other day. Alternates with furosemide      . [DISCONTINUED] HYDROcodone-acetaminophen (VICODIN) 5-500 MG per tablet Take 1 tablet by mouth every 8 (eight) hours as needed. For pain      . [DISCONTINUED] traMADol (ULTRAM) 50 MG tablet Take 50 mg by mouth 3 (three) times daily as needed. For pain      . [DISCONTINUED] trihexyphenidyl (ARTANE) 2 MG tablet Take 1 mg by mouth 3 (three) times daily with meals.      . [DISCONTINUED] vitamin E 400 UNIT capsule Take 400 Units by mouth daily.       No facility-administered encounter medications on file as of 05/01/2013.   Review of Systems  Respiratory:       Snoring  Cardiovascular: Positive for leg swelling.  Psychiatric/Behavioral:       Slepiness, too much sleep    Objective:  Neurologic Exam  Physical Exam Physical Examination:   Filed Vitals:   05/01/13 1450  BP: 148/84  Pulse: 87  Temp: 97.5 F (36.4 C)    General Examination: The patient is a very pleasant 72 y.o. female in no acute distress.  HEENT: Normocephalic, atraumatic, pupils are equal, round and  reactive to light and accommodation. Funduscopic exam is normal with sharp disc margins noted. Extraocular tracking shows moderate saccadic breakdown without nystagmus noted. There is limitation to upper gaze. There is moderate decrease in eye blink rate. Hearing is impaired. Tympanic membranes are clear bilaterally. Face is symmetric with moderate facial masking and normal facial sensation. There is no lip, neck or jaw tremor. Neck is moderately rigid with intact passive ROM. There are no carotid bruits on auscultation. Oropharynx exam  reveals mild mouth dryness. No significant airway crowding is noted. Mallampati is class II. Tongue protrudes centrally and palate elevates symmetrically.   There is drooling.   Chest: is clear to auscultation without wheezing, rhonchi or crackles noted.  Heart: sounds are regular and normal without murmurs, rubs or gallops noted.   Abdomen: is soft, non-tender and non-distended with normal bowel sounds appreciated on auscultation.  Extremities: There is 2+ pitting edema in the distal lower extremities bilaterally. Chronic stasis-like changes are noted in the distal legs bilaterally. There are no varicose veins.  Skin: is warm and dry with no trophic changes noted. Age-related changes are noted on the skin.   Musculoskeletal: exam reveals no obvious joint deformities, tenderness, joint swelling or erythema.  Neurologically:  Mental status: The patient is awake and alert, paying fair  attention. She is able to partially provide the history. Her husband provides almost the entire history. She is oriented to: person, place, situation, day of week, month of year and year. Her memory, attention, language and knowledge are impaired. There is no aphasia, agnosia, apraxia or anomia. There is a mild degree of bradyphrenia. Speech is moderately hypophonic with mild dysarthria noted. Mood is congruent and affect is blunted.    Cranial nerves are as described above under HEENT  exam. In addition, shoulder shrug is normal with equal shoulder height noted.  Motor exam: Normal bulk, and strength is 4/5, with limitation in ROM in the R shoulder. There are no dyskinesias noted.  Tone is moderately rigid with presence of cogwheeling in the right upper extremity. There is overall moderate bradykinesia. There is no drift or rebound.  There is a moderate resting tremor in the right upper extremity and a mild resting tremor in the left upper extremity. The tremor is intermittent.  Romberg is not tested.   Reflexes are 1+ in the upper extremities and trace in the lower extremities. Toes are downgoing bilaterally.  Fine motor skills exam: Finger taps are moderately impaired on the right and moderately impaired on the left. Hand movements are moderately impaired on the right and moderately impaired on the left. RAP (rapid alternating patting) is moderately impaired on the right and moderately impaired on the left. Foot taps are severely impaired on the right and moderately impaired on the left. Foot agility (in the form of heel stomping) is severely impaired on the right and moderately impaired on the left.    Cerebellar testing shows no dysmetria or intention tremor on finger to nose testing. Heel to shin is unremarkable bilaterally. There is no truncal or gait ataxia.   Sensory exam is intact to light touch, pinprick, vibration, temperature sense and proprioception in the upper and lower extremities.   Gait, station and balance: I did not her stand or walk for me today as she did not bring a walker and I did not think it was safe.    Assessment and Plan:   Assessment and Plan:  In summary, Inetha Maret is a very pleasant 72 y.o.-year old female with a history of advanced Parkinson's disease, right-sided predominant. Her physical exam is stable and has not progressed very much in the last months. She is advised to increase her Sinemet to 1-1/2 pills 5 times a day namely at 5 AM, 9  AM, 1 PM, 5 PM and 9 PM. She and her husband were in agreement. I have advised her to use her walker at all times when transferring and walking even though she does stay in her  wheelchair most of the time. She is advised to drink more fluids and keep her feet elevated as much is possible when sedentary. I would like to reevaluate her in 3 months, sooner if the need arises and I've encouraged them to call with any interim questions, concerns, problems or updates a refill requests.

## 2013-08-01 ENCOUNTER — Ambulatory Visit: Payer: Medicare Other | Admitting: Neurology

## 2013-08-01 ENCOUNTER — Telehealth: Payer: Self-pay | Admitting: Neurology

## 2013-08-02 NOTE — Telephone Encounter (Signed)
Updated Rx was already sent to the pharmacy on 05/24.  They confirmed receipt, and likely have it saved on the patients file.  I have faxed them the confiormation and asked that they refill Rx.  carbidopa-levodopa (SINEMET IR) 25-100 MG per tablet 720 tablet 3 05/01/2013     Sig: Take 1 1/2 pills at 5 am, 9am, 1 pm, 5 pm and 9 pm    E-Prescribing Status: Receipt confirmed by pharmacy (05/01/2013 3:52 PM EDT)             Associated Diagnoses    Parkinson's disease - Primary          Pharmacy    [facility] - [name], [place] - [address]

## 2013-08-07 ENCOUNTER — Telehealth: Payer: Self-pay | Admitting: Neurology

## 2013-08-07 NOTE — Telephone Encounter (Signed)
  Huston Foley, MD      Carol Mckee     MRN: [id number] DOB: 08-Jun-1941   Sent: Halford Decamp August 07, 2013 11:23 AM   Pt Home: [phone]     To: Lucille Passy, CPHT                          Message    Please advise pharmacy that I will not be prescribing this medication for the patient.       Please advise them to contact PCP and if patient wishes, I can refer her to pain management or orthopedics. She may already have a pain management doctor or orthopedic surgeon who could also take care of the prescription.   thx   Per Dr Frances Furbish, we will not be prescribing this medication.  The patient may request this from PCP or may need to get a referral to a pain clinic or Orthopedist. I called the patient back.  Spoke with [name].  He said the patient already sees a pain doctor, and they will contact that provider to request refills on this medication.

## 2013-09-10 ENCOUNTER — Other Ambulatory Visit: Payer: Self-pay

## 2013-09-11 MED ORDER — DIAZEPAM 5 MG PO TABS: 5.0000 mg | ORAL_TABLET | Freq: Two times a day (BID) | ORAL | Status: DC

## 2013-10-04 ENCOUNTER — Ambulatory Visit (INDEPENDENT_AMBULATORY_CARE_PROVIDER_SITE_OTHER): Payer: Medicare Other | Admitting: Neurology

## 2013-10-04 ENCOUNTER — Encounter: Payer: Self-pay | Admitting: Neurology

## 2013-10-04 ENCOUNTER — Encounter (INDEPENDENT_AMBULATORY_CARE_PROVIDER_SITE_OTHER): Payer: Self-pay

## 2013-10-04 VITALS — BP 112/68 | HR 79 | Temp 98.1°F | Ht 64.0 in

## 2013-10-04 DIAGNOSIS — R413 Other amnesia: Secondary | ICD-10-CM

## 2013-10-04 DIAGNOSIS — R269 Unspecified abnormalities of gait and mobility: Secondary | ICD-10-CM

## 2013-10-04 DIAGNOSIS — G20A1 Parkinson's disease without dyskinesia, without mention of fluctuations: Secondary | ICD-10-CM

## 2013-10-04 DIAGNOSIS — G20 Parkinson's disease: Secondary | ICD-10-CM

## 2013-10-04 HISTORY — DX: Parkinson's disease without dyskinesia, without mention of fluctuations: G20.A1

## 2013-10-04 HISTORY — DX: Parkinson's disease: G20

## 2013-10-04 NOTE — Progress Notes (Signed)
Subjective:    Patient ID: Carol Mckee is a 72 y.o. female.  HPI  Interim history:   Carol Mckee is a very pleasant 72 year old ambidextrous female who presents for followup consultation of her Parkinson's disease. She is unaccompanied today. I first met her on 05/01/2013, which time I suggested that she increase her Sinemet to 1-1/2 pills 5 times a day. I also reinforced the importance of using her walker at all times as she was at fall risk. She called back in August and reported an improvement in her symptoms.  She previously followed with Dr. Fayrene Fearing love and was last seen by him on 01/01/2013, at which time he felt that she would benefit from physical therapy due to her lumbar spine stenosis with chronic lower back pain. He also increased her carbidopa-levodopa to 1-1/2 pills at 5 AM, one pill at 9 AM, one pill at 1 PM, 1 1/2 pills at 5 PM and one pill at 9 PM. Her falls assessment tool score at the time was 18. She has an underlying medical history of lumbar spine stenosis, hip pain, status post hiatal hernia surgery, kidney stone surgery, bladder surgery, hysterectomy, breast biopsy, tonsillectomy and adenoidectomy.  She has an approximately ten-year history of Parkinson's disease treated with carbidopa-levodopa 25-100 mg strength 5 times daily. She developed lower back pain and was treated with hydrocodone, oxycodone and fentanyl patch. She also received epidural steroid injections. She had an EEG in May 2011. This did not show any epileptiform discharges. MRI L-spine and March 2013 showed scoliosis, severe degenerative joint disease. She has had jerking movements in her legs. This does not occur while lying down but mostly with sitting or standing. She has developed bladder incontinence. In January 2014 her MMSE was 22, clock drawing was 4, animal fluency was 8.  She has a hospital bed with rails up at night. He helps her to use a bed pan. She has to urinate 2-5 times per night. She has had a  hospital bed for the past 6-8 months. She can walk about 15 feet at a time with 2 wheeled walker. She had PT, but not recently. She had recent ST. She denies VH or AH and has not had dyskinesias. She lives close to [place] and had PT there. She had epidural injection at [facility] in the past, and at [facility] with good relief.   Her Past Medical History Is Significant For: Past Medical History  Diagnosis Date  . Edema   . Other chronic pulmonary heart diseases   . Paralysis agitans   . Stiffness of joint, not elsewhere classified,  shoulder region   . Abnormality of gait   . Fatigue   . Parkinson disease   . Spinal stenosis    Her Past Surgical History Is Significant For: History reviewed. No pertinent past surgical history.  Her Family History Is Significant For: History reviewed. No pertinent family history.  Her Social History Is Significant For: History   Social History  . Marital Status: Married    Spouse Name: Homero Fellers    Number of Children: N/A  . Years of Education: N/A   Occupational History  . RETIRED    Social History Main Topics  . Smoking status: Never Smoker   . Smokeless tobacco: Never Used  . Alcohol Use: No  . Drug Use: No  . Sexual Activity: None   Other Topics Concern  . None   Social History Narrative  . None   Her Allergies Are:  Allergies  Allergen Reactions  . Tizanidine Other (See Comments)    hypotension  . Erythromycin Diarrhea  :  Her Current Medications Are:  Outpatient Encounter Prescriptions as of 10/04/2013  Medication Sig  . baclofen (LIORESAL) 10 MG tablet   . carbidopa-levodopa (SINEMET IR) 25-100 MG per tablet Take 1 1/2 pills at 5 am, 9am, 1 pm, 5 pm and 9 pm  . Cholecalciferol (VITAMIN D3) 5000 UNITS CAPS Take 1 capsule by mouth daily.  . diazepam (VALIUM) 5 MG tablet Take 1 tablet (5 mg total) by mouth 2 (two) times daily.  . DULoxetine (CYMBALTA) 30 MG capsule   . FLUVIRIN INJ injection   . HYDROcodone-acetaminophen  (NORCO/VICODIN) 5-325 MG per tablet Take 1 tablet by mouth every 4 (four) hours as needed for pain.  Marland Kitchen KRILL OIL PO Take 1 capsule by mouth daily.  . magnesium oxide (MAG-OX) 400 MG tablet Take 400 mg by mouth daily.  . Multiple Vitamins-Minerals (CENTRUM SILVER PO) Take 1 tablet by mouth daily.  . polyethylene glycol (MIRALAX / GLYCOLAX) packet Take 17 g by mouth daily.  . vitamin E 400 UNIT capsule Take 400 Units by mouth daily.  . VOLTAREN 1 % GEL Apply 1 application topically daily as needed.  . [DISCONTINUED] Calcium Carbonate-Vitamin D (CALTRATE 600+D) 600-400 MG-UNIT per tablet Take 1 tablet by mouth daily.  . [DISCONTINUED] cephALEXin (KEFLEX) 500 MG capsule   . [DISCONTINUED] citalopram (CELEXA) 10 MG tablet Take 10 mg by mouth daily.  . [DISCONTINUED] fish oil-omega-3 fatty acids 1000 MG capsule Take 1 g by mouth daily.  . [DISCONTINUED] furosemide (LASIX) 40 MG tablet Take 40 mg by mouth every other day. Alternates with hctz  . [DISCONTINUED] hydrochlorothiazide (MICROZIDE) 12.5 MG capsule Take 12.5 mg by mouth every other day. Alternates with furosemide  :  Review of Systems:  Out of a complete 14 point review of systems, all are reviewed and negative with the exception of these symptoms as listed below:   Review of Systems  Constitutional: Negative.   HENT: Positive for trouble swallowing.   Eyes: Negative.   Respiratory:       Snoring  Cardiovascular: Negative.   Gastrointestinal: Negative.   Endocrine: Negative.   Genitourinary: Negative.   Musculoskeletal: Negative.   Skin: Negative.   Allergic/Immunologic: Negative.   Neurological: Positive for speech difficulty.  Hematological: Negative.   Psychiatric/Behavioral: Positive for confusion.    Objective:  Neurologic Exam  Physical Exam Physical Examination:   Filed Vitals:   10/04/13 1532  BP: 112/68  Pulse: 79  Temp: 98.1 F (36.7 C)    General Examination: The patient is a very pleasant 72 y.o. female  in no acute distress. She is in her wheelchair.   HEENT: Normocephalic, atraumatic, pupils are equal, round and reactive to light and accommodation. Extraocular tracking shows moderate saccadic breakdown without nystagmus noted. There is limitation to upper gaze. There is moderate decrease in eye blink rate. Hearing is impaired. Face is symmetric with moderate facial masking and normal facial sensation. There is a mild lower lip tremor. Neck is moderately rigid with intact passive ROM. There are no carotid bruits on auscultation. Oropharynx exam reveals mild mouth dryness. No significant airway crowding is noted. Mallampati is class II. Tongue protrudes centrally and palate elevates symmetrically. There is drooling.   Chest: is clear to auscultation without wheezing, rhonchi or crackles noted.  Heart: sounds are regular and normal without murmurs, rubs or gallops noted.   Abdomen: is soft, non-tender and non-distended with  normal bowel sounds appreciated on auscultation.  Extremities: There is 2+ pitting edema in the distal lower extremities bilaterally. Chronic stasis-like changes are noted in the distal legs bilaterally. There are no varicose veins, there is distal redness.  Skin: is warm and dry with no trophic changes noted. Age-related changes are noted on the skin, a spot was treated on her L forearm 2 days ago - it was pre-cancerous, per husband.    Musculoskeletal: exam reveals no obvious joint deformities, tenderness, joint swelling or erythema.  Neurologically:  Mental status: The patient is awake and alert, paying fair attention. She is able to partially provide the history. Her husband provides most of the history. She is oriented to: person, place, situation, day of week, month of year and year, not date. Her memory, attention, language and knowledge are impaired. There is no aphasia, agnosia, apraxia or anomia. There is a mild degree of bradyphrenia. Speech is moderately hypophonic  with mild dysarthria noted. Mood is congruent and affect is blunted.    Cranial nerves are as described above under HEENT exam. In addition, shoulder shrug is normal with equal shoulder height noted.  Motor exam: Normal bulk, and strength is 4/5, with limitation in ROM in the R shoulder. There are no dyskinesias noted.  Tone is moderately rigid with presence of cogwheeling in the right upper extremity. There is overall moderate bradykinesia. There is no drift or rebound.  There is a moderate resting tremor in the right upper extremity and a mild resting tremor in the left upper extremity. The tremor is intermittent. There is a mild intermittent resting tremor in both legs.  Romberg is not tested.   Reflexes are 1+ in the upper extremities and trace in the lower extremities. Fine motor skills exam: Finger taps are moderately impaired on the right and moderately impaired on the left. Hand movements are moderately impaired on the right and moderately impaired on the left. RAP (rapid alternating patting) is moderately impaired on the right and moderately impaired on the left. Foot taps are severely impaired on the right and moderately impaired on the left. Foot agility (in the form of heel stomping) is severely impaired on the right and moderately impaired on the left.    Cerebellar testing shows no dysmetria or intention tremor on finger to nose testing. There is no truncal or gait ataxia.   Sensory exam is intact to light touch, pinprick, vibration, temperature sense and proprioception in the upper and lower extremities.   Gait, station and balance: she stands with severe difficulty and needs assistance, she has a moderately to severely stooped posture and walked with significant difficulty and assistance with significant shuffling. She was not able to turn. I did not have her walk for more than 10 stops as she did not bring a walker and I did not think it was safe. Her balance is quite impaired.    Assessment and Plan:   In summary, Carol Mckee is a very pleasant 72 year old female with a history of advanced R sided predominant Parkinson's disease, complicated by memory loss and gait disorder, which is also d/t chronic degenerative back d/s. Her physical exam is fairly stable. I had a long chat again with the patient and her husband about advanced PD, its prognosis and treatment options. I do not think we need to change her medications today. But I do think she would benefit from another session of outpatient physical therapy. I made a referral in that regard. I would like to  stay locally in [place]. I have asked her to use her walker at all times. She is at fall risk. She also has memory loss and next time I would like to see if we can start her on memory medication, such as Exelon. Some of the anti-cholinergic medications can potentially exacerbate balance and gait disorders. I would like for her to have physical therapy first and see her back in about 4 months for reevaluation. They did not require any refills today. Today we spent most of our 45 minute visit and counseling and coordination of care, reviewing prior test results, prior records, in talking about the challenges of advanced Parkinson's disease. I have encouraged them to call with any interim questions, concerns, problems or updates a refill requests.

## 2013-10-04 NOTE — Patient Instructions (Addendum)
I think your Parkinson's disease has remained fairly stable, which is reassuring. Nevertheless, as you know, this disease does progress with time. It can affect your balance, your memory, your mood, your bowel and bladder function, your posture, balance and walking. Overall you are doing fairly well but I do want to suggest a few things today:  Remember to drink plenty of fluid, eat healthy meals and do not skip any meals. Try to eat protein with a every meal and eat a healthy snack such as fruit or nuts in between meals. Try to keep a regular sleep-wake schedule and try to exercise daily, particularly in the form of walking, with the walker, if you can.   Taking your medication on schedule is key.   Try to stay active physically and mentally. Engage in social activities in your community and with your family and try to keep up with current events by reading the newspaper or watching the news. Try to do word puzzles and you may like to do word puzzles and brain games on the computer such as on [email or web address].com/.   As far as your medications are concerned, I would like to suggest that you take your current medication with the following additional changes:  No changes today.   As far as diagnostic testing, I will order: no test, but will refer you to outpatient PT.   Use your walker at all times.   I would like to see you back in 4 months, sooner if we need to. Please call us with any interim questions, concerns, problems, updates or refill requests.  Brett Canales is my clinical assistant and will answer any of your questions and relay your messages to me and also relay most of my messages to you.  Our phone number is [phone]. We also have an after hours call service for urgent matters and there is a physician on-call for urgent questions, that cannot wait till the next work day. For any emergencies you know to call 911 or go to the nearest emergency room.

## 2013-10-17 ENCOUNTER — Telehealth: Payer: Self-pay | Admitting: *Deleted

## 2013-10-17 NOTE — Telephone Encounter (Signed)
Deb called and is asking about order for pt relating to PT.   I called and spoke with Mardelle Matte, PT this am and question about pt seen one time for evaluate or also continue and treat.  I told him from the note to treat also.  Offered note and he said not necessary will send plan of care for pt.

## 2013-10-26 ENCOUNTER — Ambulatory Visit: Payer: Medicare Other | Admitting: Neurology

## 2013-11-09 ENCOUNTER — Other Ambulatory Visit: Payer: Self-pay

## 2013-11-09 MED ORDER — DIAZEPAM 5 MG PO TABS: 5.0000 mg | ORAL_TABLET | Freq: Two times a day (BID) | ORAL | Status: DC

## 2013-12-17 ENCOUNTER — Telehealth: Payer: Self-pay | Admitting: *Deleted

## 2013-12-17 NOTE — Telephone Encounter (Signed)
Raynelle FanningJulie calling from PT/ Hand specialty [phone] about the POC and need for signing and faxing back to them  [phone].  I called and asked to fax to us again.  She will at [phone].

## 2013-12-17 NOTE — Telephone Encounter (Signed)
Received and gave to Brett CanalesSteve, CMA to have Dr. Frances FurbishAthar sign.

## 2014-02-21 ENCOUNTER — Ambulatory Visit (INDEPENDENT_AMBULATORY_CARE_PROVIDER_SITE_OTHER): Payer: Medicare Other | Admitting: Neurology

## 2014-02-21 ENCOUNTER — Encounter: Payer: Self-pay | Admitting: Neurology

## 2014-02-21 ENCOUNTER — Encounter (INDEPENDENT_AMBULATORY_CARE_PROVIDER_SITE_OTHER): Payer: Self-pay

## 2014-02-21 VITALS — BP 123/65 | HR 71 | Temp 98.1°F | Ht 64.0 in

## 2014-02-21 DIAGNOSIS — G4733 Obstructive sleep apnea (adult) (pediatric): Secondary | ICD-10-CM

## 2014-02-21 DIAGNOSIS — M545 Low back pain, unspecified: Secondary | ICD-10-CM

## 2014-02-21 DIAGNOSIS — R351 Nocturia: Secondary | ICD-10-CM

## 2014-02-21 DIAGNOSIS — G20 Parkinson's disease: Secondary | ICD-10-CM

## 2014-02-21 MED ORDER — DIAZEPAM 5 MG PO TABS: 5.0000 mg | ORAL_TABLET | Freq: Every day | ORAL | Status: DC

## 2014-02-21 MED ORDER — CARBIDOPA-LEVODOPA 25-100 MG PO TABS: ORAL_TABLET | ORAL | Status: DC

## 2014-02-21 NOTE — Progress Notes (Signed)
Subjective:    Patient ID: Carol Mckee is a 73 y.o. female.  HPI   Interim history:   Carol Mckee is a very pleasant 73 year old ambidextrous female with an underlying medical history of lumbar spinal stenosis, hip pain, status post hiatal hernia surgery, kidney stone surgery, bladder surgery, hysterectomy, breast biopsy, tonsillectomy and adenoidectomy, who presents for followup consultation of her advanced, right-sided predominant Parkinson's disease, complicated by memory loss, LBP, bladder incontinence and gait disorder. She is accompanied by her husband again today. I last saw her on 10/04/2013, at which time I did not make changes to her medications but made a referral to physical therapy and she wanted to stay locally in [place], [place], for this. We talked about potentially starting her on Exelon for memory loss.  Today, they report, that PT has been helpful. She uses a 2 wheeled walker in the house. She is asking if she can drive. She met with the [facility] office, and was discouraged from driving. She sees Dr. Francesco Runner in [place], [place] for pain. She is currently taking norco 1 pill tid and 1/2 pill bid, alternating. She is taking C/L 1 1/2 pills 5 times a day. She has not fallen, but has had some close calls. She feels her memory is stable and her husband agree. She sleeps in a hospital bed with her feet up and her head elevated. She snores and has breathing pauses. He has to assist her with the bedpan 3-6 times per night. She still takes Valium twice daily.  I first met her on 05/01/2013, which time I suggested that she increase her Sinemet to 1-1/2 pills 5 times a day. I also reinforced the importance of using her walker at all times as she was at fall risk. She called back in August 2014 and reported an improvement in her symptoms.  She previously followed with Dr. Morene Antu and was last seen by him on 01/01/2013, at which time he felt that she would benefit from physical therapy due to her lumbar spine  stenosis with chronic lower back pain. He also increased her carbidopa-levodopa to 1-1/2 pills at 5 AM, one pill at 9 AM, one pill at 1 PM, 1 1/2 pills at 5 PM and one pill at 9 PM. Her falls assessment tool score at the time was 18.  She has an approximately 11-year history of Parkinson's disease treated with carbidopa-levodopa 25-100 mg strength 5 times daily. She developed lower back pain and was treated with hydrocodone, oxycodone and fentanyl patch. She also received epidural steroid injections. She had an EEG in May 2011, which did not show any epileptiform discharges. MRI L-spine in March 2013 showed scoliosis, and severe degenerative joint disease. She has had jerking movements in her legs. This does not occur while lying down but mostly with sitting or standing. She has developed bladder incontinence. In January 2014 her MMSE was 22, clock drawing was 4, animal fluency was 8.  She has a hospital bed with rails up at night. He helps her to use a bed pan. She has to urinate 2-5 times per night. She has had a hospital bed for the past year. She can walk about 15 feet at a time with 2 wheeled walker. She denies VH or AH and has not had dyskinesias. She lives close to [place] and had PT there. She had epidural injection at [facility] in the past, and at [facility] with good relief.   Her Past Medical History Is Significant For: Past Medical History  Diagnosis Date  . Edema   . Other chronic pulmonary heart diseases   . Paralysis agitans   . Stiffness of joint, not elsewhere classified,  shoulder region   . Abnormality of gait   . Fatigue   . Parkinson disease   . Spinal stenosis   . Parkinson's disease 10/04/2013    Her Past Surgical History Is Significant For: History reviewed. No pertinent past surgical history.  Her Family History Is Significant For: History reviewed. No pertinent family history.  Her Social History Is Significant For: History   Social History  . Marital Status: Married     Spouse Name: Carol Mckee    Number of Children: N/A  . Years of Education: N/A   Occupational History  . RETIRED    Social History Main Topics  . Smoking status: Never Smoker   . Smokeless tobacco: Never Used  . Alcohol Use: No  . Drug Use: No  . Sexual Activity: None   Other Topics Concern  . None   Social History Narrative  . None    Her Allergies Are:  Allergies  Allergen Reactions  . Tizanidine Other (See Comments)    hypotension  . Erythromycin Diarrhea  :   Her Current Medications Are:  Outpatient Encounter Prescriptions as of 02/21/2014  Medication Sig  . baclofen (LIORESAL) 10 MG tablet Take 10 mg by mouth 2 (two) times daily.   . carbidopa-levodopa (SINEMET IR) 25-100 MG per tablet Take 1 1/2 pills at 5 am, 9am, 1 pm, 5 pm and 9 pm  . Cholecalciferol (VITAMIN D3) 5000 UNITS CAPS Take 1 capsule by mouth daily.  . diazepam (VALIUM) 5 MG tablet Take 1 tablet (5 mg total) by mouth 2 (two) times daily.  . DULoxetine (CYMBALTA) 30 MG capsule Take 30 mg by mouth daily.   Marland Kitchen FLUVIRIN INJ injection   . HYDROcodone-acetaminophen (NORCO/VICODIN) 5-325 MG per tablet Take 1 tablet by mouth every 4 (four) hours as needed for pain.  Marland Kitchen KRILL OIL PO Take 1 capsule by mouth daily.  . magnesium oxide (MAG-OX) 400 MG tablet Take 400 mg by mouth daily.  . Multiple Vitamins-Minerals (CENTRUM SILVER PO) Take 1 tablet by mouth daily.  . polyethylene glycol (MIRALAX / GLYCOLAX) packet Take 17 g by mouth daily.  . vitamin E 400 UNIT capsule Take 400 Units by mouth daily.  . VOLTAREN 1 % GEL Apply 1 application topically daily as needed.  :  Review of Systems:  Out of a complete 14 point review of systems, all are reviewed and negative with the exception of these symptoms as listed below:  Review of Systems  Constitutional: Negative.   HENT: Negative.   Eyes: Negative.   Respiratory: Negative.   Cardiovascular: Negative.   Gastrointestinal: Negative.   Endocrine: Negative.    Genitourinary: Negative.   Musculoskeletal: Negative.   Skin: Negative.   Allergic/Immunologic: Negative.   Neurological: Negative.   Hematological: Negative.   Psychiatric/Behavioral: Negative.   All other systems reviewed and are negative.    Objective:  Neurologic Exam  Physical Exam Physical Examination:   Filed Vitals:   02/21/14 1600  BP: 123/65  Pulse: 71  Temp: 98.1 F (36.7 C)    General Examination: The patient is a very pleasant 73 y.o. female in no acute distress. She is in her wheelchair.   HEENT: Normocephalic, atraumatic, pupils are equal, round and reactive to light and accommodation. Extraocular tracking shows moderate saccadic breakdown without nystagmus noted. There is limitation to upper  gaze. There is moderate decrease in eye blink rate. Hearing is impaired. Face is symmetric with moderate facial masking and normal facial sensation. There is a mild lower lip tremor. Neck is moderately to severely rigid with intact passive ROM. There are no carotid bruits on auscultation. Oropharynx exam reveals moderate mouth dryness. No significant airway crowding is noted but she does have a floppy appearing soft palate. Mallampati is class II. Tongue protrudes centrally and palate elevates symmetrically. There is mild drooling.   Chest: is clear to auscultation without wheezing, rhonchi or crackles noted.  Heart: sounds are regular and normal without murmurs, rubs or gallops noted.   Abdomen: is soft, non-tender and non-distended with normal bowel sounds appreciated on auscultation.  Extremities: There is 1+ pitting edema in the distal lower extremities bilaterally. Chronic stasis-like changes are noted in the distal legs bilaterally. There are no varicose veins, there is distal redness.  Skin: is warm and dry with no trophic changes noted. Age-related changes are noted on the skin.    Musculoskeletal: exam reveals no obvious joint deformities, tenderness, joint  swelling or erythema.  Neurologically:  Mental status: The patient is awake and alert, paying fair attention. She is able to partially provide the history. Her husband provides most of the history. She is oriented to: person, place, situation, day of week, month of year and year, not date. Her memory, attention, language and knowledge are impaired. There is no aphasia, agnosia, apraxia or anomia. There is a mild degree of bradyphrenia. Speech is moderately hypophonic with mild dysarthria noted. Mood is congruent and affect is blunted.    Cranial nerves are as described above under HEENT exam. In addition, shoulder shrug is normal with equal shoulder height noted.  Motor exam: Normal bulk, and strength is 4/5, with limitation in ROM in the R shoulder. There are no dyskinesias noted.  Tone is moderately rigid with presence of cogwheeling in the right upper extremity. There is overall moderate bradykinesia. There is no drift or rebound.  There is an intermittent mild to moderate resting tremor in the right upper extremity and a mild resting tremor in the left upper extremity. The tremor is intermittent. There is a mild intermittent resting tremor in both legs.  Romberg is not tested today.  Reflexes are 1+ in the upper extremities and trace in the lower extremities. Fine motor skills exam: Finger taps are moderately impaired on the right and moderately impaired on the left. Hand movements are moderately impaired on the right and moderately impaired on the left. RAP (rapid alternating patting) is moderately impaired on the right and moderately impaired on the left. Foot taps are severely impaired on the right and moderately impaired on the left. Foot agility (in the form of heel stomping) is severely impaired on the right and moderately impaired on the left.    Cerebellar testing shows no dysmetria or intention tremor on finger to nose testing. There is no truncal or gait ataxia.   Sensory exam is intact  to light touch, pinprick, vibration, temperature sense and proprioception in the upper and lower extremities.   Gait, station and balance: she stands with moderated difficulty and needs assistance, she has a moderately stooped posture and walks with difficulty and uses her 2 wheeled walker fairly well. She turns on her own. Her balance is mildly impaired. Her gait and balance are improved from last time.   Assessment and Plan:   In summary, Carol Mckee is a very pleasant 73 year old female with  a history of advanced R sided predominant Parkinson's disease, complicated by memory loss, gait disorder, and chronic degenerative back d/s. Her physical exam is fairly stable and in fact, her gait and balance is a little better. I agree that she has benefited from therapy. I had a long chat again with the patient and her husband about advanced PD, its prognosis and treatment options. I do not think we need to change her medications today. Her history and physical exam are concerning for obstructive sleep apnea. To that and I would like to do a sleep study. I talked to him about treatment options in particular the use of CPAP. She would be willing to try it. Her memory is stable and we will hold off on any medications including her memory medicine. I would like to see her reduce her sedating medications including her benzodiazepine and her hydrocodone. She reports that her pain is not severe. Maybe they can go down to half a pill 5 times a day with the hydrocodone. I have suggested that she reduce her Valium to 1 pill once daily. I renewed her Sinemet and Valium prescription. I really would like to see her of the Valium. She is encouraged to continue the exercises she was given by physical therapy. She is advised to use her 2 wheeled walker at all times. Today we also talked about her driving quite a bit. I have advised her no longer to drive. She has in fact not driven into years. Today we spent most of our 30 minute  visit in counseling and coordination of care, reviewing prior test results, prior records, and talking about the challenges of advanced Parkinson's disease and I explained to her why she should no longer drive. I have encouraged them to call with any interim questions, concerns, problems or updates a refill requests.

## 2014-02-21 NOTE — Patient Instructions (Addendum)
I think your Parkinson's disease has remained fairly stable, which is reassuring. Nevertheless, as you know, this disease does progress with time. It can affect your balance, your memory, your mood, your bowel and bladder function, your posture, balance and walking. Overall you are doing fairly well but I do want to suggest a few things today:  Remember to drink plenty of fluid, eat healthy meals and do not skip any meals. Try to eat protein with a every meal and eat a healthy snack such as fruit or nuts in between meals. Try to keep a regular sleep-wake schedule and try to exercise daily, particularly in the form of walking, 20-30 minutes a day, if you can. Drink more water.   Taking your medication on schedule is key.   Try to stay active physically and mentally. Engage in social activities in your community and with your family and try to keep up with current events by reading the newspaper or watching the news. Try to do word puzzles and you may like to do word puzzles and brain games on the computer such as on [email or web address].   As far as your medications are concerned, I would like to suggest that you take your current medication with the following additional changes:  No changes in your medications.    As far as diagnostic testing, I will order: sleep study, as you may have OSA. Based on your symptoms and your exam I believe you are at risk for obstructive sleep apnea or OSA, and I think we should proceed with a sleep study to determine whether you do or do not have OSA and how severe it is. If you have more than mild OSA, I want you to consider treatment with CPAP. Please remember, the risks and ramifications of moderate to severe obstructive sleep apnea or OSA are: Cardiovascular disease, including congestive heart failure, stroke, difficult to control hypertension, arrhythmias, and even type 2 diabetes has been linked to untreated OSA. Sleep apnea causes disruption of sleep and sleep deprivation in  most cases, which, in turn, can cause recurrent headaches, problems with memory, mood, concentration, focus, and vigilance. Most people with untreated sleep apnea report excessive daytime sleepiness, which can affect their ability to drive. Please do not drive if you feel sleepy.   I would like to see you back in 4 months, sooner if we need to. Please call us with any interim questions, concerns, problems, updates or refill requests.  Our nursing staff will answer any of your questions and relay your messages to me and also relay most of my messages to you.  Our phone number is [phone]. We also have an after hours call service for urgent matters and there is a physician on-call for urgent questions, that cannot wait till the next work day. For any emergencies you know to call 911 or go to the nearest emergency room.

## 2014-02-27 ENCOUNTER — Ambulatory Visit (INDEPENDENT_AMBULATORY_CARE_PROVIDER_SITE_OTHER): Payer: Medicare Other | Admitting: Neurology

## 2014-02-27 DIAGNOSIS — M545 Low back pain, unspecified: Secondary | ICD-10-CM

## 2014-02-27 DIAGNOSIS — G20 Parkinson's disease: Secondary | ICD-10-CM

## 2014-02-27 DIAGNOSIS — R351 Nocturia: Secondary | ICD-10-CM

## 2014-02-27 DIAGNOSIS — G4761 Periodic limb movement disorder: Secondary | ICD-10-CM

## 2014-02-27 DIAGNOSIS — G4733 Obstructive sleep apnea (adult) (pediatric): Secondary | ICD-10-CM

## 2014-02-27 DIAGNOSIS — G479 Sleep disorder, unspecified: Secondary | ICD-10-CM

## 2014-03-06 ENCOUNTER — Telehealth: Payer: Self-pay | Admitting: Neurology

## 2014-03-06 DIAGNOSIS — G4733 Obstructive sleep apnea (adult) (pediatric): Secondary | ICD-10-CM

## 2014-03-06 NOTE — Telephone Encounter (Signed)
Please call and notify patient that the recent sleep study confirmed the diagnosis of OSA. She did fairly well with CPAP during the study with improvement of the respiratory events. Therefore, I would like start the patient on CPAP at home. I placed the order in the chart.   Arrange for CPAP set up at home through a DME company of patient's choice and fax/route report to PCP and referring MD (if other than PCP).   The patient will also need a follow up appointment with me in 6-8 weeks post set up that has to be scheduled; help the patient schedule this (in a follow-up slot).   Please re-enforce the importance of compliance with treatment and the need for us to monitor compliance data.   Once you have spoken to the patient and scheduled the return appointment, you may close this encounter, thanks,   Tjay Velazquez, MD, PhD [facility]    

## 2014-03-08 NOTE — Telephone Encounter (Signed)
Called patient to discuss sleep study results - spoke with patient's spouse.  Discussed findings, recommendations and follow up care.  Patient understood well and all questions were answered.  A copy of the sleep study interpretive report as well as a letter with info regarding contact info for the DME company, the importance of CPAP compliance, and the need to schedule a follow up appointment info will be mailed to the patient's home.  Carol Mckee requests a copy be sent to Dr. Laurian Brim'Toole in [place] as well who is [name]'s pain management physician.  This will be done via fax.  Compliance to therapy was emphasized and patient encouraged to use CPAP during all sleep periods and at least four hours per night.  Pt takes a daily nap upright in a recliner chair.  We discussed that each time the patient sleeps without CPAP, there is risk for airway obstruction and subsequent oxygen loss and sleep disruption.  Carol Mckee verbalized understanding.    He will contact me back to let me know which D[facility] company [facility], it will be the same that his son-in-law uses.  He just has to confirm which one that is.  He prefers to wait to schedule follow up visit with Dr. Frances FurbishAthar until they have CPAP in the home.  I let him know I will send a letter with a reminder to call and schedule that.  I will wait to send orders until I hear back from him which I expect to happen today.

## 2014-03-08 NOTE — Telephone Encounter (Signed)
Carol FellersFrank calls back to tell me that [facility] in [place] is their choice for who should provide the CPAP.  I told them about Medicare's competitive bidding agreement and asked them for a second choice if [facility] is not one of Medicare's providers.  I told them Carol Mckee's pharmacy may be a provider and [facility] is definitely a provider in their area.  He chose [facility]... As it turns out they are not, therefore the orders were forwarded to [facility] for this patient.

## 2014-03-12 ENCOUNTER — Encounter: Payer: Self-pay | Admitting: *Deleted

## 2014-03-12 NOTE — Sleep Study (Signed)
See media tab for full report  

## 2014-04-09 ENCOUNTER — Encounter: Payer: Self-pay | Admitting: Neurology

## 2014-04-09 DIAGNOSIS — G4733 Obstructive sleep apnea (adult) (pediatric): Secondary | ICD-10-CM

## 2014-04-11 NOTE — Progress Notes (Signed)
Quick Note:  I reviewed the patient's CPAP compliance data from 03/20/2014 to 04/08/2014, which is a total of 21 days, during which time the patient used CPAP every day. The average usage for all days was 10 hours and 10 minutes. The percent used days greater than 4 hours was 100 %, indicating superb compliance. The residual AHI was unfortunately high at 22.4 per hour, indicating an inadequate treatment pressure of 9 cwp with EPR of 3. Her residual AHI is primarily obstructive in nature per AHI breakdown. Air leak from the mask was also elevated with 38.1 L per minute at the 90th percentile. We will get in touch with her home health company and I will place her on [facility] until her appointment on 05/02/2014 at 2 PM for review.  Huston FoleySaima Sophiya Morello, MD, PhD [facility]   ______

## 2014-04-16 ENCOUNTER — Encounter: Payer: Self-pay | Admitting: Neurology

## 2014-04-22 ENCOUNTER — Encounter: Payer: Self-pay | Admitting: Neurology

## 2014-05-02 ENCOUNTER — Ambulatory Visit (INDEPENDENT_AMBULATORY_CARE_PROVIDER_SITE_OTHER): Payer: Medicare Other | Admitting: Neurology

## 2014-05-02 ENCOUNTER — Encounter: Payer: Self-pay | Admitting: Neurology

## 2014-05-02 ENCOUNTER — Encounter (INDEPENDENT_AMBULATORY_CARE_PROVIDER_SITE_OTHER): Payer: Self-pay

## 2014-05-02 VITALS — BP 152/78 | HR 81 | Temp 98.4°F | Ht 64.0 in | Wt 186.0 lb

## 2014-05-02 DIAGNOSIS — R269 Unspecified abnormalities of gait and mobility: Secondary | ICD-10-CM

## 2014-05-02 DIAGNOSIS — G4733 Obstructive sleep apnea (adult) (pediatric): Secondary | ICD-10-CM

## 2014-05-02 DIAGNOSIS — Z9989 Dependence on other enabling machines and devices: Principal | ICD-10-CM

## 2014-05-02 DIAGNOSIS — G20 Parkinson's disease: Secondary | ICD-10-CM

## 2014-05-02 DIAGNOSIS — M545 Low back pain, unspecified: Secondary | ICD-10-CM

## 2014-05-02 NOTE — Patient Instructions (Signed)
Please continue using your CPAP regularly. While your insurance requires that you use CPAP at least 4 hours each night on 70% of the nights, I recommend, that you not skip any nights and use it throughout the night if you can. Getting used to CPAP and staying with the treatment long term does take time and patience and discipline. Untreated obstructive sleep apnea when it is moderate to severe can have an adverse impact on cardiovascular health and raise her risk for heart disease, arrhythmias, hypertension, congestive heart failure, stroke and diabetes. Untreated obstructive sleep apnea causes sleep disruption, nonrestorative sleep, and sleep deprivation. This can have an impact on your day to day functioning and cause daytime sleepiness and impairment of cognitive function, memory loss, mood disturbance, and problems focussing. Using CPAP regularly can improve these symptoms.  As far as your Sinemet: let's try a different schedule: take 1 1/2 pills at 6 AM, 10 AM, 2 PM, 6 PM, 10 PM.   Please use your CPAP for a nap too!  Please take your Baclofen just at night.

## 2014-05-02 NOTE — Progress Notes (Signed)
Subjective:    Patient ID: Carol Mckee is a 73 y.o. female.  HPI    Interim history:   Carol Mckee is a very pleasant 73 year old ambidextrous female with an underlying medical history of lumbar spinal stenosis, hip pain, status post hiatal hernia surgery, kidney stone surgery, bladder surgery, hysterectomy, breast biopsy, tonsillectomy and adenoidectomy, who presents for followup consultation of her advanced, right-sided predominant Parkinson's disease, complicated by memory loss, LBP, bladder incontinence and gait disorder and after her recent sleep study. She is accompanied by her husband again today. I saw her on 02/21/2014, at which time we talked about her signs and symptoms concerning for sleep apnea and I suggested a sleep study. I also advised her to try to reduce sedating medications including her Valium as well as her hydrocodone. She was advised to use her walker at all times. She felt that physical therapy was helpful. She was asking if she could drive. She had been to the [facility] office, and was discouraged from driving. She sees Dr. Francesco Runner in [place], [place] for pain. She had been taking norco 1 pill tid and 1/2 pill bid, alternating. She is taking C/L 1 1/2 pills 5 times a day.  She has sleep study on 02/27/2014 and went over her test results with her in detail today. Her baseline sleep efficiency was 100% with 100% of stage II sleep only. She had no significant EKG changes or periodic leg movements of sleep. Her total AHI was high at 54.5 per hour. Baseline oxygen saturation was 94%, nadir was 86%. She was titrated on CPAP from 5-8 centimeters of water pressure. She was noted to have central apneas. She was noted to have mouth opening. On a pressure of 8 cm a residual AHI was 6.5 per hour. She had no REM sleep. I placed the patient on CPAP of 9 cm. 03/20/2014 to 04/08/2014, which is a total of 21 days, during which time the patient used CPAP every day. The average usage for all days was 10 hours  and 10 minutes. The percent used days greater than 4 hours was 100 %, indicating superb compliance. The residual AHI was unfortunately high at 22.4 per hour, indicating an inadequate treatment pressure of 9 cwp with EPR of 3. Her residual AHI is primarily obstructive in nature per AHI breakdown. Air leak from the mask was also elevated with 38.1 L per minute at the 95th percentile. Earlier this month based on these results I placed her on AutoPap as opposed to CPAP.  Today, she is asking whether she can drive. She is using CPAP regularly and feels improved, and feels, she has more energy during the day. She had a sore on her bridge of her nose from the nasal mask and is now on a FFM. He has noted, that she moves a little better, she speaks louder too. She has been on autoPAP. I reviewed the patient's PAP compliance data from 04/16/2014 to 05/01/2014, which is a total of 16 days, during which time the patient used CPAP every day. The average usage for all days was 10 hours and 12 minutes. The percent used days greater than 4 hours was 100 %, indicating superb compliance. The residual AHI was unfortunately elevated at 14.9 per hour. The pressure setting is from 8-13 cm. 95th percentile pressure was 12.9. Her leak was at times high. She reports that she is sleeping a little bit later and therefore cannot always take her 5 AM Sinemet dose. Her nasal sore is healing  well and she is adjusting to the full face mask better. She is today also frustrated regarding her inability to drive and that she is no longer able to use the computer 2 right e-mails. Her husband apparently turned off the Internet and he has also taken away her privileges to manage their finances. She is worried that he has become her caretaker rather than her husband. She is quite tearful at times and feels that she is completely dependent on him and that she has lost her independence. She is on Valium once daily and hydrocodone have to alternating with  one pill.  I saw her on 10/04/2013, at which time I did not make changes to her medications but made a referral to physical therapy and she wanted to stay locally in [place], [place], for this. We talked about potentially starting her on Exelon for memory loss.  I first met her on 05/01/2013, which time I suggested that she increase her Sinemet to 1-1/2 pills 5 times a day. I also reinforced the importance of using her walker at all times as she was at fall risk. She called back in August 2014 and reported an improvement in her symptoms.  She previously followed with Dr. Morene Antu and was last seen by him on 01/01/2013, at which time he felt that she would benefit from physical therapy due to her lumbar spine stenosis with chronic lower back pain. He also increased her carbidopa-levodopa to 1-1/2 pills at 5 AM, one pill at 9 AM, one pill at 1 PM, 1 1/2 pills at 5 PM and one pill at 9 PM. Her falls assessment tool score at the time was 18.  She has an approximately 12-year history of Parkinson's disease treated with carbidopa-levodopa 25-100 mg strength 5 times daily. She developed lower back pain and was treated with hydrocodone, oxycodone and fentanyl patch. She also received epidural steroid injections. She had an EEG in May 2011, which did not show any epileptiform discharges. MRI L-spine in March 2013 showed scoliosis, and severe degenerative joint disease. She has had jerking movements in her legs. This does not occur while lying down but mostly with sitting or standing. She has developed bladder incontinence. In January 2014 her MMSE was 22, clock drawing was 4, animal fluency was 8.  She has a hospital bed with rails up at night. He helps her to use a bed pan. She has to urinate 2-5 times per night. She can walk about 15 feet at a time with 2 wheeled walker. She denies VH or AH and has not had dyskinesias. She lives close to [place] and had PT there. She had epidural injection at [facility] in the past, and at [facility]  with good relief.   Her Past Medical History Is Significant For: Past Medical History  Diagnosis Date  . Edema   . Other chronic pulmonary heart diseases   . Paralysis agitans   . Stiffness of joint, not elsewhere classified,  shoulder region   . Abnormality of gait   . Fatigue   . Parkinson disease   . Spinal stenosis   . Parkinson's disease 10/04/2013    Her Past Surgical History Is Significant For: No past surgical history on file.  Her Family History Is Significant For: No family history on file.  Her Social History Is Significant For: History   Social History  . Marital Status: Married    Spouse Name: Pilar Plate    Number of Children: 3  . Years of Education: 14  Occupational History  . RETIRED    Social History Main Topics  . Smoking status: Never Smoker   . Smokeless tobacco: Never Used  . Alcohol Use: No  . Drug Use: No  . Sexual Activity: None   Other Topics Concern  . None   Social History Narrative   Patient is right handed.resides in a home with husband    Her Allergies Are:  Allergies  Allergen Reactions  . Lyrica [Pregabalin]     Drops blood pressure  . Tizanidine Other (See Comments)    hypotension  . Erythromycin Diarrhea  :   Her Current Medications Are:  Outpatient Encounter Prescriptions as of 05/02/2014  Medication Sig  . baclofen (LIORESAL) 10 MG tablet Take 10 mg by mouth daily.   . carbidopa-levodopa (SINEMET IR) 25-100 MG per tablet Take 1 1/2 pills at 5 am, 9am, 1 pm, 5 pm and 9 pm  . Cholecalciferol (VITAMIN D3) 5000 UNITS CAPS Take 1 capsule by mouth daily.  . diazepam (VALIUM) 5 MG tablet Take 1 tablet (5 mg total) by mouth daily.  . DULoxetine (CYMBALTA) 30 MG capsule Take 30 mg by mouth daily.   Marland Kitchen FLUVIRIN INJ injection   . HYDROcodone-acetaminophen (NORCO/VICODIN) 5-325 MG per tablet Take 1 tablet by mouth every 4 (four) hours as needed for pain. Up to 4 daily,patient is taking at 9:00-1/2 pill,5:00-1/2 pill  . KRILL OIL PO  Take 1 capsule by mouth daily.  . magnesium oxide (MAG-OX) 400 MG tablet Take 400 mg by mouth daily.  . Multiple Vitamins-Minerals (CENTRUM SILVER PO) Take 1 tablet by mouth daily.  . polyethylene glycol (MIRALAX / GLYCOLAX) packet Take 17 g by mouth daily.  . vitamin E 400 UNIT capsule Take 400 Units by mouth daily.  . VOLTAREN 1 % GEL Apply 1 application topically daily as needed.  :  Review of Systems:  Out of a complete 14 point review of systems, all are reviewed and negative with the exception of these symptoms as listed below:  Review of Systems  Musculoskeletal: Positive for back pain.  Neurological:       Apnea    Objective:  Neurologic Exam  Physical Exam Physical Examination:   Filed Vitals:   05/02/14 1430  BP: 152/78  Pulse: 81  Temp: 98.4 F (36.9 C)   General Examination: The patient is a very pleasant 73 y.o. female in no acute distress. She is situated in a chair. She brought in her rolling walker with seat. She is tearful.  HEENT: Normocephalic, atraumatic, pupils are equal, round and reactive to light and accommodation. Extraocular tracking shows moderate saccadic breakdown without nystagmus noted. There is limitation to upper gaze. There is moderate decrease in eye blink rate. Hearing is impaired. Face is symmetric with moderate facial masking and normal facial sensation. There is a mild lower lip tremor. Neck is moderately to severely rigid with intact passive ROM. There are no carotid bruits on auscultation. Oropharynx exam reveals moderate mouth dryness. No significant airway crowding is noted but she does have a floppy appearing soft palate. Mallampati is class II. Tongue protrudes centrally and palate elevates symmetrically. There is mild drooling.   Chest: is clear to auscultation without wheezing, rhonchi or crackles noted.  Heart: sounds are regular and normal without murmurs, rubs or gallops noted.   Abdomen: is soft, non-tender and non-distended with  normal bowel sounds appreciated on auscultation.  Extremities: There is 1+ pitting edema in the distal lower extremities bilaterally. Chronic stasis-like changes  are noted in the distal legs bilaterally. There are no varicose veins, there is distal redness.  Skin: is warm and dry with no trophic changes noted. Age-related changes are noted on the skin.    Musculoskeletal: exam reveals no obvious joint deformities, tenderness, joint swelling or erythema.  Neurologically:  Mental status: The patient is awake and alert, paying fair attention. She is able to partially provide the history. Her husband provides most of the history. She is oriented to: person, place, situation, day of week, month of year and year, not date. Her memory, attention, language and knowledge are impaired. There is no aphasia, agnosia, apraxia or anomia. There is a mild degree of bradyphrenia. Speech is mild to moderately hypophonic with minimal dysarthria noted, she does seem to enunciate better today. Mood is congruent and affect is blunted.    Cranial nerves are as described above under HEENT exam. In addition, shoulder shrug is normal with equal shoulder height noted.  Motor exam: Normal bulk, and strength is 4/5, with limitation in ROM in the R shoulder, unchanged. There are no dyskinesias noted.  Tone is moderately rigid with presence of cogwheeling in the right upper extremity. There is overall moderate bradykinesia. There is no drift or rebound.  There is an intermittent mild to moderate resting tremor in the right upper extremity and a mild resting tremor in the left upper extremity. The tremor is intermittent. There is a mild intermittent resting tremor in both legs.  Romberg is not tested today.  Reflexes are 1+ in the upper extremities and trace in the lower extremities. Fine motor skills exam: Finger taps are moderately impaired on the right and moderately impaired on the left. Hand movements are moderately impaired  on the right and moderately impaired on the left. RAP (rapid alternating patting) is moderately impaired on the right and moderately impaired on the left. Foot taps are severely impaired on the right and moderately impaired on the left. Foot agility (in the form of heel stomping) is severely impaired on the right and moderately impaired on the left.    Cerebellar testing shows no dysmetria or intention tremor on finger to nose testing. There is no truncal or gait ataxia.   Sensory exam is intact to light touch, pinprick, vibration, temperature sense in the upper and lower extremities.   Gait, station and balance: she stands with moderated difficulty and needs assistance, she has a moderately stooped posture and walks with difficulty and uses her 4 wheeled walker fairly well. She turns on her own. Her balance is mildly impaired. Her gait and balance are improved from last time.   Assessment and Plan:   In summary, Ladine Kiper is a very pleasant 73 year old female with a history of advanced R sided predominant Parkinson's disease, complicated by memory loss, gait disorder, and chronic degenerative back d/s, with need for chronic narcotic pain medication. Her physical exam is fairly stable and perhaps slightly better. Her speech seems to be improved. I believe that she has benefited from CPAP treatment and her husband and the patient herself also agree with that. We talked about her sleep test results in detail today. She is still adjusting to the treatment. She has had a change in her mask from a nasal mask or a fullface mask. Unfortunately her residual AHI is high. To that end, I will change her from our perhaps a CPAP of 14 cm to see if this will improve her sleep disordered breathing. I again had a long conversation  with the patient and her husband regarding the issues patients and their families face when there is advancing Parkinson's disease. There are limitations to physical capabilities and also  more need for care as time goes by. I advised her to no longer drive. She has in fact not driven in years. She is encouraged her to continue her Sinemet, but change the timing to 6, 10, 2, 6 PM and 10 PM and to continue with CPAP regularly. Today we spent most of our 40 minute visit in counseling and coordination of care, reviewing test results, compliance data and talking about the challenges of advanced Parkinson's disease and I explained to her again at length why she should no longer drive. I have encouraged them to call with any interim questions, concerns, problems or updates a refill requests. I will see her back in 3 months.

## 2014-07-09 ENCOUNTER — Other Ambulatory Visit: Payer: Self-pay

## 2014-07-09 MED ORDER — DIAZEPAM 5 MG PO TABS: 5.0000 mg | ORAL_TABLET | Freq: Every day | ORAL | Status: DC

## 2014-07-09 NOTE — Telephone Encounter (Signed)
Rx signed and faxed.

## 2014-07-09 NOTE — Telephone Encounter (Signed)
Dr Athar is out of the office.  Forwarding request to [facility] for approval.   

## 2014-08-23 ENCOUNTER — Encounter: Payer: Self-pay | Admitting: Neurology

## 2014-08-26 ENCOUNTER — Encounter (INDEPENDENT_AMBULATORY_CARE_PROVIDER_SITE_OTHER): Payer: Self-pay

## 2014-08-26 ENCOUNTER — Encounter: Payer: Self-pay | Admitting: Neurology

## 2014-08-26 ENCOUNTER — Ambulatory Visit (INDEPENDENT_AMBULATORY_CARE_PROVIDER_SITE_OTHER): Payer: Medicare Other | Admitting: Neurology

## 2014-08-26 VITALS — BP 116/57 | HR 88 | Temp 98.2°F | Ht 62.0 in | Wt 194.0 lb

## 2014-08-26 DIAGNOSIS — G20 Parkinson's disease: Secondary | ICD-10-CM

## 2014-08-26 DIAGNOSIS — R413 Other amnesia: Secondary | ICD-10-CM

## 2014-08-26 DIAGNOSIS — M545 Low back pain, unspecified: Secondary | ICD-10-CM

## 2014-08-26 DIAGNOSIS — R351 Nocturia: Secondary | ICD-10-CM

## 2014-08-26 DIAGNOSIS — G4733 Obstructive sleep apnea (adult) (pediatric): Secondary | ICD-10-CM

## 2014-08-26 DIAGNOSIS — Z9989 Dependence on other enabling machines and devices: Principal | ICD-10-CM

## 2014-08-26 MED ORDER — CARBIDOPA-LEVODOPA 25-100 MG PO TABS: ORAL_TABLET | ORAL | Status: DC

## 2014-08-26 NOTE — Progress Notes (Signed)
Subjective:    Patient ID: Carol Mckee is a 73 y.o. female.  HPI    Interim history:   Carol Mckee is a very pleasant 73 year old ambidextrous female with an underlying medical history of lumbar spinal stenosis, hip pain, status post hiatal hernia surgery, kidney stone surgery, bladder surgery, hysterectomy, breast biopsy, tonsillectomy and adenoidectomy, who presents for followup consultation of her advanced, right-sided predominant Parkinson's disease, complicated by memory loss, LBP, bladder incontinence and gait disorder and OSA. She is accompanied by her husband again today. I last saw her on 05/02/2014, at which time I talked her about her recent sleep study results and her CPAP treatment. She had done well with that. I changed her from AutoPap to CPAP of 14 cm. She changed from a nasal mask to a full face mask. I asked her to not drive anymore. I asked her to continue with her Parkinson's medications. I changed the timing to 6, 10, 2, 6 PM and 10 PM and I encouraged her to continue using CPAP regularly.  Today, I reviewed her compliance data from 07/24/2014 through 08/22/2014 which is a total of 30 days during which time she was 100% compliant. Her residual AHI was unfortunately high at 23.5. 95th percentile pressure was 12.8 but leak was very high at times. She is supposed to be on CPAP but is still on AutoPap from 8-13 with EPR of 3. Today she reports, she can tell the mask leaks and she has some residual snoring according to her husband. She has a [facility], Med. She feels stable with her PD symptoms.   I saw her on 02/21/2014, at which time we talked about her signs and symptoms concerning for sleep apnea and I suggested a sleep study. I also advised her to try to reduce sedating medications including her Valium as well as her hydrocodone. She was advised to use her walker at all times. She felt that physical therapy was helpful. She was asking if she could drive. She had been to the [facility]  office, and was discouraged from driving. She sees Dr. Francesco Runner in [place], [place] for pain. She had been taking norco 1 pill tid and 1/2 pill bid, alternating. She is taking C/L 1 1/2 pills 5 times a day.  She has sleep study on 02/27/2014 and went over her test results with her in detail today. Her baseline sleep efficiency was 100% with 100% of stage II sleep only. She had no significant EKG changes or periodic leg movements of sleep. Her total AHI was high at 54.5 per hour. Baseline oxygen saturation was 94%, nadir was 86%. She was titrated on CPAP from 5-8 centimeters of water pressure. She was noted to have central apneas. She was noted to have mouth opening. On a pressure of 8 cm a residual AHI was 6.5 per hour. She had no REM sleep. I placed the patient on CPAP of 9 cm. 03/20/2014 to 04/08/2014, which is a total of 21 days, during which time the patient used CPAP every day. The average usage for all days was 10 hours and 10 minutes. The percent used days greater than 4 hours was 100 %, indicating superb compliance. The residual AHI was unfortunately high at 22.4 per hour, indicating an inadequate treatment pressure of 9 cwp with EPR of 3. Her residual AHI is primarily obstructive in nature per AHI breakdown. Air leak from the mask was also elevated with 38.1 L per minute at the 95th percentile. Earlier this month based on these  results I placed her on AutoPap as opposed to CPAP.   I reviewed the patient's PAP compliance data from 04/16/2014 to 05/01/2014, which is a total of 16 days, during which time the patient used CPAP every day. The average usage for all days was 10 hours and 12 minutes. The percent used days greater than 4 hours was 100 %, indicating superb compliance. The residual AHI was unfortunately elevated at 14.9 per hour. The pressure setting is from 8-13 cm. 95th percentile pressure was 12.9. Her leak was at times high.  I saw her on 10/04/2013, at which time I did not make changes to her  medications but made a referral to physical therapy and she wanted to stay locally in [place], [place], for this. We talked about potentially starting her on Exelon for memory loss.  I first met her on 05/01/2013, which time I suggested that she increase her Sinemet to 1-1/2 pills 5 times a day. I also reinforced the importance of using her walker at all times as she was at fall risk. She called back in August 2014 and reported an improvement in her symptoms.  She previously followed with Dr. Morene Antu and was last seen by him on 01/01/2013, at which time he felt that she would benefit from physical therapy due to her lumbar spine stenosis with chronic lower back pain. He also increased her carbidopa-levodopa to 1-1/2 pills at 5 AM, one pill at 9 AM, one pill at 1 PM, 1 1/2 pills at 5 PM and one pill at 9 PM. Her falls assessment tool score at the time was 18.  She has an approximately 12-year history of Parkinson's disease treated with carbidopa-levodopa 25-100 mg strength 5 times daily. She developed lower back pain and was treated with hydrocodone, oxycodone and fentanyl patch. She also received epidural steroid injections. She had an EEG in May 2011, which did not show any epileptiform discharges. MRI L-spine in March 2013 showed scoliosis, and severe degenerative joint disease. She has had jerking movements in her legs. This does not occur while lying down but mostly with sitting or standing. She has developed bladder incontinence. In January 2014 her MMSE was 22, clock drawing was 4, animal fluency was 8.  She has a hospital bed with rails up at night. He helps her to use a bed pan. She has to urinate 2-5 times per night. She can walk about 15 feet at a time with 2 wheeled walker. She denies VH or AH and has not had dyskinesias. She lives close to [place] and had PT there. She had epidural injection at [facility] in the past, and at [facility] with good relief.   Her Past Medical History Is Significant For: Past Medical  History  Diagnosis Date  . Edema   . Other chronic pulmonary heart diseases   . Paralysis agitans   . Stiffness of joint, not elsewhere classified,  shoulder region   . Abnormality of gait   . Fatigue   . Parkinson disease   . Spinal stenosis   . Parkinson's disease 10/04/2013    Her Past Surgical History Is Significant For: No past surgical history on file.  Her Family History Is Significant For: No family history on file.  Her Social History Is Significant For: History   Social History  . Marital Status: Married    Spouse Name: Pilar Plate    Number of Children: 3  . Years of Education: 14   Occupational History  . RETIRED  Social History Main Topics  . Smoking status: Never Smoker   . Smokeless tobacco: Never Used  . Alcohol Use: No  . Drug Use: No  . Sexual Activity: None   Other Topics Concern  . None   Social History Narrative   Patient is right handed.resides in a home with husband    Her Allergies Are:  Allergies  Allergen Reactions  . Azithromycin Diarrhea  . Lyrica [Pregabalin]     Drops blood pressure  . Tizanidine Other (See Comments)    hypotension  . Erythromycin Diarrhea  :   Her Current Medications Are:  Outpatient Encounter Prescriptions as of 08/26/2014  Medication Sig  . baclofen (LIORESAL) 10 MG tablet Take 10 mg by mouth daily.   . carbidopa-levodopa (SINEMET IR) 25-100 MG per tablet Take 1 1/2 pills at 5 am, 9am, 1 pm, 5 pm and 9 pm  . Cholecalciferol (VITAMIN D3) 5000 UNITS CAPS Take 1 capsule by mouth daily.  . diazepam (VALIUM) 5 MG tablet Take 1 tablet (5 mg total) by mouth daily.  . DULoxetine (CYMBALTA) 30 MG capsule Take 30 mg by mouth daily.   Marland Kitchen FLUVIRIN INJ injection   . HYDROcodone-acetaminophen (NORCO/VICODIN) 5-325 MG per tablet Take 1 tablet by mouth every 4 (four) hours as needed for pain. Up to 4 daily,patient is taking at 9:00-1/2 pill,5:00-1/2 pill  . KRILL OIL PO Take 1 capsule by mouth daily.  . magnesium oxide  (MAG-OX) 400 MG tablet Take 400 mg by mouth daily.  . Multiple Vitamins-Minerals (CENTRUM SILVER PO) Take 1 tablet by mouth daily.  . polyethylene glycol (MIRALAX / GLYCOLAX) packet Take 17 g by mouth daily.  . vitamin E 400 UNIT capsule Take 400 Units by mouth daily.  . VOLTAREN 1 % GEL Apply 1 application topically daily as needed.  :  Review of Systems:  Out of a complete 14 point review of systems, all are reviewed and negative with the exception of these symptoms as listed below:   Review of Systems  Genitourinary:       Painful urination, incontinence of bladder  Skin: Positive for rash.  Neurological: Positive for tremors.       Apnea,     Objective:  Neurologic Exam  Physical Exam BP 116/57  Pulse 88  Temp(Src) 98.2 F (36.8 C) (Oral)  Ht 5\' 2"  (1.575 m)  Wt 194 lb (87.998 kg)  BMI 35.47 kg/m2  General Examination: The patient is a very pleasant 73 y.o. female in no acute distress. She is situated in a chair. She brought in her rolling walker with seat. She is in good spirits today.   HEENT: Normocephalic, atraumatic, pupils are equal, round and reactive to light and accommodation. Funduscopy is normal. Mild b/l cataracts are noted. She has corrective eye glasses. Extraocular tracking shows moderate saccadic breakdown without nystagmus noted. There is limitation to upper gaze. There is moderate decrease in eye blink rate. Hearing is impaired. Face is symmetric with moderate facial masking and normal facial sensation. There is a mild lower lip tremor. Neck is moderately to severely rigid with intact passive ROM. There are no carotid bruits on auscultation. Oropharynx exam reveals moderate mouth dryness. No significant airway crowding is noted but she does have a floppy appearing soft palate. Mallampati is class II. Tongue protrudes centrally and palate elevates symmetrically. There is mild drooling. She has a mild degree of redness over her nasal bridge.  Chest: is clear to  auscultation without wheezing, rhonchi or crackles  noted.  Heart: sounds are regular and normal without murmurs, rubs or gallops noted.   Abdomen: is soft, non-tender and non-distended with normal bowel sounds appreciated on auscultation.  Extremities: There is 2+ pitting edema in the distal lower extremities bilaterally. Chronic stasis-like changes are noted in the distal legs bilaterally. There are no varicose veins, there is distal redness.  Skin: is warm and dry with no trophic changes noted. Age-related changes are noted on the skin.    Musculoskeletal: exam reveals no obvious joint deformities, tenderness, joint swelling or erythema.  Neurologically:  Mental status: The patient is awake and alert, paying fair attention. She is able to partially provide the history. Her husband provides most of the history. She is oriented to: person, place, situation, day of week, month of year and year, not date. Her memory, attention, language and knowledge are mildly impaired. There is no aphasia, agnosia, apraxia or anomia. There is a mild degree of bradyphrenia.  08/26/2014: MMSE 25/30, CDT 2/4, AFT: 9.  Speech is mild to moderately hypophonic with minimal dysarthria noted, she does seem to enunciate better today. Mood is congruent and affect is normal.  Cranial nerves are as described above under HEENT exam. In addition, shoulder shrug is normal with equal shoulder height noted.  Motor exam: Normal bulk, and strength is 4/5, with limitation in ROM in the R shoulder, unchanged. There are no dyskinesias noted.  Tone is moderately rigid with presence of cogwheeling in the right upper extremity. There is overall moderate bradykinesia. There is no drift or rebound.  There is an intermittent mild to moderate resting tremor in the right upper extremity and a mild resting tremor in the left upper extremity. The tremor is intermittent. There is a mild intermittent resting tremor in both legs.  Romberg is not  tested today.  Reflexes are 1+ in the upper extremities and trace in the lower extremities. Fine motor skills exam: Finger taps are moderately impaired on the right and moderately impaired on the left. Hand movements are moderately impaired on the right and moderately impaired on the left. RAP (rapid alternating patting) is moderately impaired on the right and moderately impaired on the left. Foot taps are severely impaired on the right and moderately impaired on the left. Foot agility (in the form of heel stomping) is severely impaired on the right and moderately impaired on the left.    Cerebellar testing shows no dysmetria or intention tremor on finger to nose testing. There is no truncal or gait ataxia.   Sensory exam is intact to light touch, pinprick, vibration, temperature sense in the upper and lower extremities.   Gait, station and balance: she stands with moderated difficulty and needs assistance, she has a moderately stooped posture and walks with difficulty and uses her 4 wheeled walker fairly well. She turns on her own. Her balance is mildly impaired. Her gait and balance are improved from last time.   Assessment and Plan:   In summary, Carol Mckee is a very pleasant 73 year old female with a history of advanced R sided predominant Parkinson's disease, complicated by memory loss, gait disorder, and chronic degenerative back d/s, with need for chronic narcotic pain medication and OSA on CPAP. Her physical exam has remained fairly stable and she has been compliant with CPAP treatment and her husband and the patient herself also agree that she has done better with CPAP with regards to her sleep consolidation and daytime somnolence. I again had a long conversation with the patient  and her husband regarding the issues with advanced Parkinson's disease. She is encouraged her to continue her Sinemet at the same regimen. I had requested a pressure change in her CPAP in May but it does not look like  that was actually done. She still him AutoPap. I would like to change her to CPAP of 14 cm. In addition she needs a mask refit as the leak is too high. I renewed her prescription for Sinemet and suggested I see her back in about 6 months, sooner if the need arises. I congratulated her on her superb compliance with treatment.

## 2014-08-26 NOTE — Patient Instructions (Addendum)
Please continue using your CPAP regularly. While your insurance requires that you use CPAP at least 4 hours each night on 70% of the nights, I recommend, that you not skip any nights and use it throughout the night if you can. Getting used to CPAP and staying with the treatment long term does take time and patience and discipline. Untreated obstructive sleep apnea when it is moderate to severe can have an adverse impact on cardiovascular health and raise her risk for heart disease, arrhythmias, hypertension, congestive heart failure, stroke and diabetes. Untreated obstructive sleep apnea causes sleep disruption, nonrestorative sleep, and sleep deprivation. This can have an impact on your day to day functioning and cause daytime sleepiness and impairment of cognitive function, memory loss, mood disturbance, and problems focussing. Using CPAP regularly can improve these symptoms.y Your parkinson's is stable. We will keep your Sinemet the same.  I would like for you to have a better CPAP mask fit and pressure of 14 cm. We will get in touch with [facility] for this.  We will see you back in 6 months.

## 2014-09-10 ENCOUNTER — Other Ambulatory Visit: Payer: Self-pay

## 2014-09-10 MED ORDER — DIAZEPAM 5 MG PO TABS: 5.0000 mg | ORAL_TABLET | Freq: Every day | ORAL | Status: DC

## 2014-09-10 NOTE — Telephone Encounter (Signed)
Patient has appt scheduled in March

## 2014-09-10 NOTE — Telephone Encounter (Signed)
Rx signed and faxed.

## 2014-11-08 ENCOUNTER — Encounter: Payer: Self-pay | Admitting: Neurology

## 2015-01-29 ENCOUNTER — Other Ambulatory Visit: Payer: Self-pay | Admitting: Neurology

## 2015-02-25 ENCOUNTER — Ambulatory Visit (INDEPENDENT_AMBULATORY_CARE_PROVIDER_SITE_OTHER): Payer: Medicare Other | Admitting: Neurology

## 2015-02-25 ENCOUNTER — Encounter: Payer: Self-pay | Admitting: Neurology

## 2015-02-25 VITALS — BP 122/55 | HR 83 | Resp 18 | Ht 62.0 in | Wt 192.0 lb

## 2015-02-25 DIAGNOSIS — Z9989 Dependence on other enabling machines and devices: Secondary | ICD-10-CM

## 2015-02-25 DIAGNOSIS — R351 Nocturia: Secondary | ICD-10-CM | POA: Diagnosis not present

## 2015-02-25 DIAGNOSIS — M545 Low back pain, unspecified: Secondary | ICD-10-CM

## 2015-02-25 DIAGNOSIS — R413 Other amnesia: Secondary | ICD-10-CM | POA: Diagnosis not present

## 2015-02-25 DIAGNOSIS — G20 Parkinson's disease: Secondary | ICD-10-CM

## 2015-02-25 DIAGNOSIS — G4733 Obstructive sleep apnea (adult) (pediatric): Secondary | ICD-10-CM

## 2015-02-25 MED ORDER — CARBIDOPA-LEVODOPA 25-100 MG PO TABS: ORAL_TABLET | ORAL | Status: DC

## 2015-02-25 NOTE — Progress Notes (Addendum)
Subjective:    Patient ID: Carol Mckee is a 74 y.o. female.  HPI     Interim history:   Carol Mckee is a very pleasant 74 year old ambidextrous female with an underlying medical history of lumbar spinal stenosis, hip pain, status post hiatal hernia surgery, kidney stone surgery, bladder surgery, hysterectomy, breast biopsy, tonsillectomy and adenoidectomy, who presents for followup consultation of her advanced, right-sided predominant Parkinson's disease, complicated by memory loss, LBP, bladder incontinence and gait disorder and OSA, which is being treated with CPAP therapy. She is accompanied by her husband and her sister today. I last saw her on 08/26/2014, at which time she had some residual snoring according to her husband. She was using a fullface mask. Parkinson's-wise, she felt stable. I changed her from AutoPap to a set pressure of 14 cm.   Today: I reviewed her CPAP compliance data from 01/25/2015 through 02/23/2015 which is a total of 30 days during which time she used her machine every night with percent used days greater than 4 hours at 100%, indicating superb compliance on her behalf with an average usage of 9 hours and 34 minutes and pressure has now been changed to 14 cm with EPR of 3 but AHI was suboptimal at 14.4, probably due to very high leak with the 95th percentile of leak at 78.6 L/m.  Today: Her history is provided by the patient and supplemented by her husband. She reports that she continues to struggle with the mask. She has a full face mask and still opens her mouth and has a chinstrap in addition. The chinstrap is uncomfortable. Unfortunately, in December she fell from her recliner. She was actually sitting in her lift chair and fell asleep and accidentally pushed the control button and she was lifted up and fell forward onto her face. She did not have any headache or loss of consciousness and no laceration but did have a bruise a couple of days later. This has cleared up.  She had no residual headaches. She continues to take 7 1/2 pills of Sinemet daily. She has ongoing issues with low back pain. In the past she had injection treatment with variable results. She sees Dr. Francesco Runner for this. She takes hydrocodone 3 times a day, sometimes she tries to cut back to just half a pill 3 times a day but has a flareup of pain. She does not report much in the way of constipation. She tries to be proactive. She has to take MiraLAX every day. She may not drink enough water still. She has nocturia multiple times a night which is very bothersome to her. She feels her memory and mood are stable.   Previously:  I saw her on 05/02/2014, at which time I talked her about her recent sleep study results and her CPAP treatment. She had done well with that. I changed her from AutoPap to CPAP of 14 cm. She changed from a nasal mask to a full face mask. I asked her to not drive anymore. I asked her to continue with her Parkinson's medications. I changed the timing to 6, 10, 2, 6 PM and 10 PM and I encouraged her to continue using CPAP regularly.  I reviewed her compliance data from 07/24/2014 through 08/22/2014 which is a total of 30 days during which time she was 100% compliant. Her residual AHI was unfortunately high at 23.5. 95th percentile pressure was 12.8 but leak was very high at times. She is supposed to be on CPAP but is still on  AutoPap from 8-13 with EPR of 3.  I saw her on 02/21/2014, at which time we talked about her signs and symptoms concerning for sleep apnea and I suggested a sleep study. I also advised her to try to reduce sedating medications including her Valium as well as her hydrocodone. She was advised to use her walker at all times. She felt that physical therapy was helpful. She was asking if she could drive. She had been to the [facility] office, and was discouraged from driving. She sees Dr. Francesco Runner in [place], [place] for pain. She had been taking norco 1 pill tid and 1/2 pill bid,  alternating. She is taking C/L 1 1/2 pills 5 times a day.   She has sleep study on 02/27/2014 and went over her test results with her in detail today. Her baseline sleep efficiency was 100% with 100% of stage II sleep only. She had no significant EKG changes or periodic leg movements of sleep. Her total AHI was high at 54.5 per hour. Baseline oxygen saturation was 94%, nadir was 86%. She was titrated on CPAP from 5-8 centimeters of water pressure. She was noted to have central apneas. She was noted to have mouth opening. On a pressure of 8 cm a residual AHI was 6.5 per hour. She had no REM sleep. I placed the patient on CPAP of 9 cm. 03/20/2014 to 04/08/2014, which is a total of 21 days, during which time the patient used CPAP every day. The average usage for all days was 10 hours and 10 minutes. The percent used days greater than 4 hours was 100 %, indicating superb compliance. The residual AHI was unfortunately high at 22.4 per hour, indicating an inadequate treatment pressure of 9 cwp with EPR of 3. Her residual AHI is primarily obstructive in nature per AHI breakdown. Air leak from the mask was also elevated with 38.1 L per minute at the 95th percentile. Earlier this month based on these results I placed her on AutoPap as opposed to CPAP.   I reviewed the patient's PAP compliance data from 04/16/2014 to 05/01/2014, which is a total of 16 days, during which time the patient used CPAP every day. The average usage for all days was 10 hours and 12 minutes. The percent used days greater than 4 hours was 100 %, indicating superb compliance. The residual AHI was unfortunately elevated at 14.9 per hour. The pressure setting is from 8-13 cm. 95th percentile pressure was 12.9. Her leak was at times high.   I saw her on 10/04/2013, at which time I did not make changes to her medications but made a referral to physical therapy and she wanted to stay locally in [place], [place], for this. We talked about potentially starting her  on Exelon for memory loss.   I first met her on 05/01/2013, which time I suggested that she increase her Sinemet to 1-1/2 pills 5 times a day. I also reinforced the importance of using her walker at all times as she was at fall risk. She called back in August 2014 and reported an improvement in her symptoms.   She previously followed with Dr. Morene Antu and was last seen by him on 01/01/2013, at which time he felt that she would benefit from physical therapy due to her lumbar spine stenosis with chronic lower back pain. He also increased her carbidopa-levodopa to 1-1/2 pills at 5 AM, one pill at 9 AM, one pill at 1 PM, 1 1/2 pills at 5 PM and one  pill at 9 PM. Her falls assessment tool score at the time was 18.   She has an approximately 12-year history of Parkinson's disease treated with carbidopa-levodopa 25-100 mg strength 5 times daily. She developed lower back pain and was treated with hydrocodone, oxycodone and fentanyl patch. She also received epidural steroid injections. She had an EEG in May 2011, which did not show any epileptiform discharges. MRI L-spine in March 2013 showed scoliosis, and severe degenerative joint disease. She has had jerking movements in her legs. This does not occur while lying down but mostly with sitting or standing. She has developed bladder incontinence. In January 2014 her MMSE was 22, clock drawing was 4, animal fluency was 8.   She has a hospital bed with rails up at night. He helps her to use a bed pan. She has to urinate 2-5 times per night. She can walk about 15 feet at a time with 2 wheeled walker. She denies VH or AH and has not had dyskinesias. She lives close to [place] and had PT there. She had epidural injection at [facility] in the past, and at [facility] with good relief.   Her Past Medical History Is Significant For: Past Medical History  Diagnosis Date  . Edema   . Other chronic pulmonary heart diseases   . Paralysis agitans   . Stiffness of joint, not elsewhere  classified,  shoulder region   . Abnormality of gait   . Fatigue   . Parkinson disease   . Spinal stenosis   . Parkinson's disease 10/04/2013    Her Past Surgical History Is Significant For: No past surgical history on file.  Her Family History Is Significant For: No family history on file.  Her Social History Is Significant For: History   Social History  . Marital Status: Married    Spouse Name: Pilar Plate  . Number of Children: 3  . Years of Education: 14   Occupational History  . RETIRED    Social History Main Topics  . Smoking status: Never Smoker   . Smokeless tobacco: Never Used  . Alcohol Use: No  . Drug Use: No  . Sexual Activity: Not on file   Other Topics Concern  . None   Social History Narrative   Patient is right handed.resides in a home with husband    Her Allergies Are:  Allergies  Allergen Reactions  . Azithromycin Diarrhea  . Lyrica [Pregabalin]     Drops blood pressure  . Tizanidine Other (See Comments)    hypotension  . Erythromycin Diarrhea  :   Her Current Medications Are:  Outpatient Encounter Prescriptions as of 02/25/2015  Medication Sig  . baclofen (LIORESAL) 10 MG tablet Take 10 mg by mouth daily.   . carbidopa-levodopa (SINEMET IR) 25-100 MG per tablet TAKE 1(1/2) TABLETS AT 5AM, 9AM, 1PM, 5PM AND 9PM.  . Cholecalciferol (VITAMIN D3) 5000 UNITS CAPS Take 1 capsule by mouth daily.  . diazepam (VALIUM) 5 MG tablet Take 1 tablet (5 mg total) by mouth daily.  . DULoxetine (CYMBALTA) 30 MG capsule Take 30 mg by mouth daily.   Marland Kitchen FLUVIRIN INJ injection   . HYDROcodone-acetaminophen (NORCO/VICODIN) 5-325 MG per tablet Take 1 tablet by mouth every 4 (four) hours as needed for pain. Up to 4 daily,patient is taking at 9:00-1/2 pill,5:00-1/2 pill  . KRILL OIL PO Take 1 capsule by mouth daily.  . magnesium oxide (MAG-OX) 400 MG tablet Take 400 mg by mouth daily.  . Multiple Vitamins-Minerals (CENTRUM SILVER  PO) Take 1 tablet by mouth daily.  .  polyethylene glycol (MIRALAX / GLYCOLAX) packet Take 17 g by mouth daily.  . vitamin E 400 UNIT capsule Take 400 Units by mouth daily.  . VOLTAREN 1 % GEL Apply 1 application topically daily as needed.  . [DISCONTINUED] carbidopa-levodopa (SINEMET IR) 25-100 MG per tablet TAKE 1(1/2) TABLETS AT 5AM, 9AM, 1PM, 5PM AND 9PM.  :  Review of Systems:  Out of a complete 14 point review of systems, all are reviewed and negative with the exception of these symptoms as listed below:    Review of Systems  Neurological:       [place] [address] from recliner into floor in December 2015 and hit face    All other systems reviewed and are negative.   Objective:  Neurologic Exam  Physical Exam Physical Examination:   Filed Vitals:   02/25/15 1524  BP: 122/55  Pulse: 83  Resp: 18   General Examination: The patient is a very pleasant 73 y.o. female in no acute distress. She is situated in a chair. She brought in her rolling walker with seat. She is in good spirits today.  she reports midline low back pain.   HEENT: Normocephalic, atraumatic, pupils are equal, round and reactive to light and accommodation. Funduscopy is normal. Mild b/l cataracts are noted. She has corrective eye glasses. Extraocular tracking shows moderate saccadic breakdown without nystagmus noted. There is limitation to upper gaze. There is moderate decrease in eye blink rate. Hearing is impaired. Face is symmetric with moderate facial masking and normal facial sensation. There is a mild lower lip tremor. Neck is moderately to severely rigid with intact passive ROM. There are no carotid bruits on auscultation. Oropharynx exam reveals moderate  to severe mouth dryness. No significant airway crowding is noted but she does have a floppy appearing soft palate. Mallampati is class II. Tongue protrudes centrally and palate elevates symmetrically. There is mild drooling. She has a mild degree of redness over her nasal bridge.  Chest: is clear to  auscultation without wheezing, rhonchi or crackles noted.  Heart: sounds are regular and normal without murmurs, rubs or gallops noted.   Abdomen: is soft, non-tender and non-distended with normal bowel sounds appreciated on auscultation.  Extremities: There is 2+ pitting edema in the distal lower extremities bilaterally. Chronic stasis-like changes are noted in the distal legs bilaterally. There are no varicose veins, there is distal redness.  Skin: is warm and dry with no trophic changes noted. Age-related changes are noted on the skin.    Musculoskeletal: exam reveals no obvious joint deformities, tenderness, joint swelling or erythema.  Neurologically:  Mental status: The patient is awake and alert, paying fair attention. She is able to partially provide the history. Her husband provides most of the history. She is oriented to: person, place, situation, day of week, month of year and year, not date. Her memory, attention, language and knowledge are mildly impaired. There is no aphasia, agnosia, apraxia or anomia. There is a mild degree of bradyphrenia.   On 08/26/2014: MMSE 25/30, CDT 2/4, AFT: 9.   On 02/25/2015: MMSE: 27/30, CDT: 3/4, AFT: 8/min, GDS: 2/15.  Speech is mild to moderately hypophonic with minimal dysarthria noted, she does seem to enunciate better today. Mood is congruent and affect is normal.  Cranial nerves are as described above under HEENT exam. In addition, shoulder shrug is normal with equal shoulder height noted.  Motor exam: Normal bulk, and strength is 4/5, with limitation in  ROM in the R shoulder, unchanged. There are no dyskinesias noted.  Tone is moderately rigid with presence of cogwheeling in the right upper extremity. There is overall moderate bradykinesia. There is no drift or rebound.  There is an intermittent mild to moderate resting tremor in the right upper extremity and a mild resting tremor in the left upper extremity. The tremor is intermittent. There  is a mild intermittent resting tremor in both legs.  Romberg is not tested today.  Reflexes are 1+ in the upper extremities and trace in the lower extremities. Fine motor skills exam: Finger taps are moderately to severely impaired on the right and moderately impaired on the left. Hand movements are moderately impaired on the right and moderately impaired on the left. RAP (rapid alternating patting) is moderately to severely impaired on the right and moderately impaired on the left. Foot taps are severely impaired on the right and moderately impaired on the left. Foot agility (in the form of heel stomping) is severely impaired on the right and moderately impaired on the left.    Cerebellar testing shows no dysmetria or intention tremor on finger to nose testing. There is no truncal or gait ataxia.   Sensory exam is intact to light touch, pinprick, vibration, temperature sense in the upper and lower extremities.   Gait, station and balance: she stands with moderated difficulty and needs assistance, she has a moderately stooped posture and walks with difficulty but uses her 4 wheeled walker fairly well. She turns on her own. Her balance is mild to moderately impaired. Her gait and balance overall appear to be stable from last time.   Assessment and Plan:   In summary, Carol Mckee is a very pleasant 74 year old female with a history of advanced R sided predominant Parkinson's disease, complicated by memory loss, gait disorder, and chronic degenerative back d/s, with need for chronic narcotic pain medication and OSA on CPAP. Her physical exam has remained fairly stable and she has been compliant with CPAP treatment, but air leak is too high at this time. Her AHI is high on pressure of 14 cm and I will request a mask refit. She is an appointment with her DME provider. I will also switch her back to AutoPap until we have her leak under control. She is encouraged to continue using her CPAP as much as she can.  She is advised to use caution when sitting in her lift chair and make sure that that controls her away from her arm in case she does follow sleep. I suggested we continue Sinemet at the current dose. I renewed her prescription. She is encouraged to talk to her spine specialist about potential considering epidural steroid injections again as they were helpful at one point in the past. She is encouraged to drink more water. She is advised to use her walker at all times for safety. Her memory loss is stable and we will continue to monitor. In fact, memory scores are slightly better today than last time. I would like to see her back in about 6 months, sooner if the need arises.  I encouraged him to call for any interim questions or concerns.  I spent 25 minutes in total face-to-face time with the patient, more than 50% of which was spent in counseling and coordination of care, reviewing test results, reviewing medication and discussing or reviewing the diagnosis of PD and OSA, the prognosis and treatment options.

## 2015-02-25 NOTE — Patient Instructions (Signed)
We will continue with Sinemet at the current dose.  Ask Dr. Laurian Brim'Toole about getting a steroid injection into the back.  I will see you back in 6 months.

## 2015-03-03 ENCOUNTER — Encounter: Payer: Self-pay | Admitting: Neurology

## 2015-03-21 ENCOUNTER — Other Ambulatory Visit: Payer: Self-pay

## 2015-03-21 MED ORDER — DIAZEPAM 5 MG PO TABS: 5.0000 mg | ORAL_TABLET | Freq: Every day | ORAL | Status: DC

## 2015-04-07 ENCOUNTER — Other Ambulatory Visit: Payer: Self-pay | Admitting: Neurology

## 2015-08-19 ENCOUNTER — Other Ambulatory Visit: Payer: Self-pay | Admitting: Neurology

## 2015-08-19 NOTE — Telephone Encounter (Signed)
Patient has an appt scheduled

## 2015-08-28 ENCOUNTER — Encounter: Payer: Self-pay | Admitting: Neurology

## 2015-08-28 ENCOUNTER — Ambulatory Visit (INDEPENDENT_AMBULATORY_CARE_PROVIDER_SITE_OTHER): Payer: Medicare Other | Admitting: Neurology

## 2015-08-28 VITALS — BP 138/62 | HR 76 | Resp 20 | Ht 62.0 in | Wt 194.0 lb

## 2015-08-28 DIAGNOSIS — G4733 Obstructive sleep apnea (adult) (pediatric): Secondary | ICD-10-CM | POA: Diagnosis not present

## 2015-08-28 DIAGNOSIS — M545 Low back pain, unspecified: Secondary | ICD-10-CM

## 2015-08-28 DIAGNOSIS — G20 Parkinson's disease: Secondary | ICD-10-CM | POA: Diagnosis not present

## 2015-08-28 DIAGNOSIS — Z9989 Dependence on other enabling machines and devices: Secondary | ICD-10-CM

## 2015-08-28 DIAGNOSIS — R413 Other amnesia: Secondary | ICD-10-CM

## 2015-08-28 MED ORDER — DIAZEPAM 5 MG PO TABS: 2.5000 mg | ORAL_TABLET | Freq: Every day | ORAL | Status: DC

## 2015-08-28 MED ORDER — CARBIDOPA-LEVODOPA 25-100 MG PO TABS: ORAL_TABLET | ORAL | Status: DC

## 2015-08-28 NOTE — Patient Instructions (Signed)
We will continue with sinemet at the current dose and times, but will try to reduce your Valium to 1/2 pill each night.

## 2015-08-28 NOTE — Progress Notes (Signed)
Subjective:    Patient ID: Carol Mckee is a 74 y.o. female.  HPI     Interim history:   Carol Mckee is a very pleasant 74 year old ambidextrous female with an underlying medical history of lumbar spinal stenosis, hip pain, status post hiatal hernia surgery, kidney stone surgery, bladder surgery, hysterectomy, breast biopsy, tonsillectomy and adenoidectomy, who presents for followup consultation of her advanced, right-sided predominant Parkinson's disease, complicated by memory loss, LBP, bladder incontinence and gait disorder and OSA, which is being treated with CPAP therapy. She is accompanied by her husband today. I last saw her on 02/25/2015, at which time she reported that she was continuing to struggle with the CPAP mask. She had trouble closing her mouth. She was using a fullface mask with a chinstrap. Unfortunately, she had fallen from her recliner in December. She had been in her lift chair and fell asleep and accidentally pushed the control button. She fell forward onto her face. She did not have any LOC or residual headaches. She was on 7 1/2 pills of Sinemet daily. She was having ongoing issues with low back pain. In the past she had injection treatment with variable results, under Dr. Francesco Runner for this. She was on hydrocodone 3 times a day. She had nocturia multiple times a night which was very bothersome to her. She felt her memory and mood were stable.   Today, 08/28/2015: I reviewed her CPAP compliance data from 07/27/2015 through 08/25/2015 which is a total of 30 days during which time she used her machine every night, with percent used days greater than 4 hours at 100%, indicating superb compliance with an average usage of 10 hours and 27 minutes, residual AHI high at 27.1 per hour, leak I with the 95th percentile at 54.5 L/m on a pressure of 14 cm with EPR of 3. Looking at her AHI breakdown, it looks like her residual sleep disordered breathing is primarily obstructive in nature. She was  supposed to switch to AutoPap therapy but appears to be still on the same setting of 14 cm.  Today, 08/28/2015: She reports ongoing issues with back pain. Her right shoulder bothers her and she has severe limitation in range of motion. She uses a 2 wheeled walker. At night she sleeps in a hospital bed and then around 5 AM she moves into her lift chair. She has to get up at 6 times per night for bathroom breaks. Her husband had some health issues in the last year. He reports no significant changes. Her memory is stable she feels.  Previously:   I saw her on 08/26/2014, at which time she had some residual snoring according to her husband. She was using a fullface mask. Parkinson's-wise, she felt stable. I changed her from AutoPap to a set pressure of 14 cm.   I reviewed her CPAP compliance data from 01/25/2015 through 02/23/2015 which is a total of 30 days during which time she used her machine every night with percent used days greater than 4 hours at 100%, indicating superb compliance on her behalf with an average usage of 9 hours and 34 minutes and pressure has now been changed to 14 cm with EPR of 3 but AHI was suboptimal at 14.4, probably due to very high leak with the 95th percentile of leak at 78.6 L/m.   I saw her on 05/02/2014, at which time I talked her about her recent sleep study results and her CPAP treatment. She had done well with that. I changed her from  AutoPap to CPAP of 14 cm. She changed from a nasal mask to a full face mask. I asked her to not drive anymore. I asked her to continue with her Parkinson's medications. I changed the timing to 6, 10, 2, 6 PM and 10 PM and I encouraged her to continue using CPAP regularly.  I reviewed her compliance data from 07/24/2014 through 08/22/2014 which is a total of 30 days during which time she was 100% compliant. Her residual AHI was unfortunately high at 23.5. 95th percentile pressure was 12.8 but leak was very high at times. She is supposed to  be on CPAP but is still on AutoPap from 8-13 with EPR of 3.  I saw her on 02/21/2014, at which time we talked about her signs and symptoms concerning for sleep apnea and I suggested a sleep study. I also advised her to try to reduce sedating medications including her Valium as well as her hydrocodone. She was advised to use her walker at all times. She felt that physical therapy was helpful. She was asking if she could drive. She had been to the [facility] office, and was discouraged from driving. She sees Dr. Francesco Runner in [place], [place] for pain. She had been taking norco 1 pill tid and 1/2 pill bid, alternating. She is taking C/L 1 1/2 pills 5 times a day.   She has sleep study on 02/27/2014 and went over her test results with her in detail today. Her baseline sleep efficiency was 100% with 100% of stage II sleep only. She had no significant EKG changes or periodic leg movements of sleep. Her total AHI was high at 54.5 per hour. Baseline oxygen saturation was 94%, nadir was 86%. She was titrated on CPAP from 5-8 centimeters of water pressure. She was noted to have central apneas. She was noted to have mouth opening. On a pressure of 8 cm a residual AHI was 6.5 per hour. She had no REM sleep. I placed the patient on CPAP of 9 cm. 03/20/2014 to 04/08/2014, which is a total of 21 days, during which time the patient used CPAP every day. The average usage for all days was 10 hours and 10 minutes. The percent used days greater than 4 hours was 100 %, indicating superb compliance. The residual AHI was unfortunately high at 22.4 per hour, indicating an inadequate treatment pressure of 9 cwp with EPR of 3. Her residual AHI is primarily obstructive in nature per AHI breakdown. Air leak from the mask was also elevated with 38.1 L per minute at the 95th percentile. Earlier this month based on these results I placed her on AutoPap as opposed to CPAP.   I reviewed the patient's PAP compliance data from 04/16/2014 to 05/01/2014, which is  a total of 16 days, during which time the patient used CPAP every day. The average usage for all days was 10 hours and 12 minutes. The percent used days greater than 4 hours was 100 %, indicating superb compliance. The residual AHI was unfortunately elevated at 14.9 per hour. The pressure setting is from 8-13 cm. 95th percentile pressure was 12.9. Her leak was at times high.   I saw her on 10/04/2013, at which time I did not make changes to her medications but made a referral to physical therapy and she wanted to stay locally in [place], [place], for this. We talked about potentially starting her on Exelon for memory loss.   I first met her on 05/01/2013, which time I suggested that she  increase her Sinemet to 1-1/2 pills 5 times a day. I also reinforced the importance of using her walker at all times as she was at fall risk. She called back in August 2014 and reported an improvement in her symptoms.   She previously followed with Dr. Morene Antu and was last seen by him on 01/01/2013, at which time he felt that she would benefit from physical therapy due to her lumbar spine stenosis with chronic lower back pain. He also increased her carbidopa-levodopa to 1-1/2 pills at 5 AM, one pill at 9 AM, one pill at 1 PM, 1 1/2 pills at 5 PM and one pill at 9 PM. Her falls assessment tool score at the time was 18.   She has an approximately 12-year history of Parkinson's disease treated with carbidopa-levodopa 25-100 mg strength 5 times daily. She developed lower back pain and was treated with hydrocodone, oxycodone and fentanyl patch. She also received epidural steroid injections. She had an EEG in May 2011, which did not show any epileptiform discharges. MRI L-spine in March 2013 showed scoliosis, and severe degenerative joint disease. She has had jerking movements in her legs. This does not occur while lying down but mostly with sitting or standing. She has developed bladder incontinence. In January 2014 her MMSE was 22, clock  drawing was 4, animal fluency was 8.   She has a hospital bed with rails up at night. He helps her to use a bed pan. She has to urinate 2-5 times per night. She can walk about 15 feet at a time with 2 wheeled walker. She denies VH or AH and has not had dyskinesias. She lives close to [place] and had PT there. She had epidural injection at [facility] in the past, and at [facility] with good relief.   Her Past Medical History Is Significant For: Past Medical History  Diagnosis Date  . Edema   . Other chronic pulmonary heart diseases   . Paralysis agitans   . Stiffness of joint, not elsewhere classified,  shoulder region   . Abnormality of gait   . Fatigue   . Parkinson disease   . Spinal stenosis   . Parkinson's disease 10/04/2013    Her Past Surgical History Is Significant For: No past surgical history on file.  Her Family History Is Significant For: No family history on file.  Her Social History Is Significant For: Social History   Social History  . Marital Status: Married    Spouse Name: Pilar Plate  . Number of Children: 3  . Years of Education: 14   Occupational History  . RETIRED    Social History Main Topics  . Smoking status: Never Smoker   . Smokeless tobacco: Never Used  . Alcohol Use: No  . Drug Use: No  . Sexual Activity: Not Asked   Other Topics Concern  . None   Social History Narrative   Patient is right handed.resides in a home with husband    Her Allergies Are:  Allergies  Allergen Reactions  . Azithromycin Diarrhea  . Lyrica [Pregabalin]     Drops blood pressure  . Tizanidine Other (See Comments)    hypotension  . Erythromycin Diarrhea  :   Her Current Medications Are:  Outpatient Encounter Prescriptions as of 08/28/2015  Medication Sig  . baclofen (LIORESAL) 10 MG tablet Take 10 mg by mouth daily.   . carbidopa-levodopa (SINEMET IR) 25-100 MG per tablet TAKE 1 & 1/2 TABLET AT 5 AM, 9AM, 1 PM, 5  PM, AND 9 PM.  . Cholecalciferol (VITAMIN D3) 5000 UNITS  CAPS Take 1 capsule by mouth daily.  . diazepam (VALIUM) 5 MG tablet Take 1 tablet (5 mg total) by mouth daily.  . DULoxetine (CYMBALTA) 30 MG capsule Take 30 mg by mouth daily.   Marland Kitchen FLUVIRIN INJ injection   . HYDROcodone-acetaminophen (NORCO/VICODIN) 5-325 MG per tablet Take 1 tablet by mouth every 4 (four) hours as needed for pain. Up to 4 daily,patient is taking at 9:00-1/2 pill,5:00-1/2 pill  . KRILL OIL PO Take 1 capsule by mouth daily.  . magnesium oxide (MAG-OX) 400 MG tablet Take 400 mg by mouth daily.  . Multiple Vitamins-Minerals (CENTRUM SILVER PO) Take 1 tablet by mouth daily.  . polyethylene glycol (MIRALAX / GLYCOLAX) packet Take 17 g by mouth daily.  . vitamin E 400 UNIT capsule Take 400 Units by mouth daily.  . VOLTAREN 1 % GEL Apply 1 application topically daily as needed.   No facility-administered encounter medications on file as of 08/28/2015.  :  Review of Systems:  Out of a complete 14 point review of systems, all are reviewed and negative with the exception of these symptoms as listed below:   Review of Systems  Neurological:       Patient reports that her CPAP mask leaks at night when she sleeps with her mouth open.     Objective:  Neurologic Exam  Physical Exam Physical Examination:   Filed Vitals:   08/28/15 1258  BP: 138/62  Pulse: 76  Resp: 20   General Examination: The patient is a very pleasant 74 y.o. female in no acute distress. She is situated in a chair. She brought in her 2 wheeled walker. She is in good spirits today. She reports right sided back pain.   HEENT: Normocephalic, atraumatic, pupils are equal, round and reactive to light and accommodation. Funduscopy is normal. Mild b/l cataracts are noted. She has corrective eye glasses. Extraocular tracking shows moderate saccadic breakdown without nystagmus noted. There is limitation to upper gaze. There is moderate decrease in eye blink rate. Hearing is impaired. Face is symmetric with moderate  facial masking and normal facial sensation. There is a mild lower lip tremor. Neck is moderately to severely rigid with intact passive ROM. There are no carotid bruits on auscultation. Oropharynx exam reveals moderate mouth dryness. No significant airway crowding is noted but she does have a floppy appearing soft palate. Mallampati is class II. Tongue protrudes centrally and palate elevates symmetrically. There is mild drooling. She has a mild degree of redness over her nasal bridge.  Chest: is clear to auscultation without wheezing, rhonchi or crackles noted.  Heart: sounds are regular and normal without murmurs, rubs or gallops noted.   Abdomen: is soft, non-tender and non-distended with normal bowel sounds appreciated on auscultation.  Extremities: There is 2+ pitting edema in the distal lower extremities bilaterally. Chronic stasis-like changes are noted in the distal legs bilaterally. There are no varicose veins, there is distal redness.  Skin: is warm and dry with no trophic changes noted. Age-related changes are noted on the skin.    Musculoskeletal: exam reveals no obvious joint deformities, tenderness, joint swelling or erythema, except severe decrease in ROM in R shoulder.  Neurologically:  Mental status: The patient is awake and alert, paying fair attention. She is able to partially provide the history. Her husband provides most of the history. She is oriented to: person, place, situation, day of week, month of year and year,  not date. Her memory, attention, language and knowledge are mildly impaired. There is no aphasia, agnosia, apraxia or anomia. There is a mild degree of bradyphrenia.   On 08/26/2014: MMSE 25/30, CDT 2/4, AFT: 9.   On 02/25/2015: MMSE: 27/30, CDT: 3/4, AFT: 8/min, GDS: 2/15.  On 08/28/2015: MMSE: 26/30, CDT: 3/4, AFT: 5/min.   Speech is mild to moderately hypophonic with mild dysarthria noted. Mood is congruent and affect is normal.  Cranial nerves are as  described above under HEENT exam. In addition, shoulder shrug is normal with equal shoulder height noted.  Motor exam: Normal bulk, and strength is 4/5, with limitation in ROM in the R shoulder, unchanged. There are no dyskinesias noted.  Tone is moderately rigid with presence of cogwheeling in the right upper extremity. There is overall moderate bradykinesia. There is no drift or rebound.  There is an intermittent mild to moderate resting tremor in the right upper extremity and a mild resting tremor in the left upper extremity. The tremor is intermittent. There is a slight intermittent resting tremor in both legs.  Romberg is not tested today.  Reflexes are 1+ in the upper extremities and trace in the lower extremities. Fine motor skills exam: Finger taps are moderately to severely impaired on the right and moderately impaired on the left. Hand movements are moderately impaired on the right and moderately impaired on the left. RAP (rapid alternating patting) is moderately to severely impaired on the right and moderately impaired on the left. Foot taps are severely impaired on the right and moderately impaired on the left. Foot agility (in the form of heel stomping) is severely impaired on the right and moderately impaired on the left.    Cerebellar testing shows no dysmetria or intention tremor on finger to nose testing. There is no truncal or gait ataxia.   Sensory exam is intact to light touch, pinprick, vibration, temperature sense in the upper and lower extremities.   Gait, station and balance: she stands with moderated difficulty and needs assistance, she has a moderately stooped posture and walks with difficulty but uses her 2 wheeled walker fairly well. She turns on her own. Her balance is mild to moderately impaired. Her gait and balance overall appear to be stable from last time.   Assessment and Plan:   In summary, Carol Mckee is a very pleasant 74 year old female with a history of  advanced R sided predominant Parkinson's disease, complicated by memory loss, gait disorder, and chronic degenerative back d/s, with need for chronic narcotic pain medication and OSA on CPAP. She is fully compliant with CPAP therapy but her leak is too high still. Her AHI is still high. We probably have achieved a 50% improvement of her sleep apnea. She is using a fullface mask. I will request another mask refit. She is encouraged to try to stay active mentally and physically. She sleeps in a hospital bed at night and also partially in her lift chair. Thankfully she has not fallen. We will continue with Sinemet at the current dose. She has also been on Valium. We will try to reduce this to 2.5 mg daily. She is encouraged to try to reduce her hydrocodone if possible. Her memory has remained stable. She is reassured in that regard. We will continue to monitor. I would like to see her back in 6 months, sooner if needed. I spent 25 minutes in total face-to-face time with the patient, more than 50% of which was spent in counseling and coordination  of care, reviewing test results, reviewing medication and discussing or reviewing the diagnosis of PD and OSA, the prognosis and treatment options.

## 2015-12-25 ENCOUNTER — Other Ambulatory Visit: Payer: Self-pay | Admitting: Neurology

## 2016-02-25 ENCOUNTER — Encounter: Payer: Self-pay | Admitting: Neurology

## 2016-02-25 ENCOUNTER — Ambulatory Visit (INDEPENDENT_AMBULATORY_CARE_PROVIDER_SITE_OTHER): Payer: Medicare Other | Admitting: Neurology

## 2016-02-25 VITALS — BP 122/64 | HR 78 | Resp 16 | Ht 62.0 in | Wt 189.0 lb

## 2016-02-25 DIAGNOSIS — G4733 Obstructive sleep apnea (adult) (pediatric): Secondary | ICD-10-CM

## 2016-02-25 DIAGNOSIS — R351 Nocturia: Secondary | ICD-10-CM

## 2016-02-25 DIAGNOSIS — G20 Parkinson's disease: Secondary | ICD-10-CM | POA: Diagnosis not present

## 2016-02-25 DIAGNOSIS — M545 Low back pain, unspecified: Secondary | ICD-10-CM

## 2016-02-25 DIAGNOSIS — Z9989 Dependence on other enabling machines and devices: Secondary | ICD-10-CM

## 2016-02-25 DIAGNOSIS — M7989 Other specified soft tissue disorders: Secondary | ICD-10-CM | POA: Diagnosis not present

## 2016-02-25 DIAGNOSIS — R413 Other amnesia: Secondary | ICD-10-CM

## 2016-02-25 DIAGNOSIS — G20A1 Parkinson's disease without dyskinesia, without mention of fluctuations: Secondary | ICD-10-CM

## 2016-02-25 MED ORDER — CARBIDOPA-LEVODOPA 25-100 MG PO TABS
1.5000 | ORAL_TABLET | Freq: Every day | ORAL | Status: DC
Start: 2016-02-25 — End: 2016-12-18

## 2016-02-25 NOTE — Patient Instructions (Addendum)
Let's try to slowly go down on the hydrocodone gradually, if you can.   Let's increase your Sinemet to 1 1/2 pills 6 times a day: at 5 AM, 8 AM, 11 AM, 2 PM, 5 PM and around 8 PM.   Memory scores are good.   We will increase your CPAP pressure slightly and try a different mask.

## 2016-02-25 NOTE — Progress Notes (Signed)
Subjective:    Patient ID: Carol Mckee is a 75 y.o. female.  HPI     Interim history:  Carol Mckee is a very pleasant 75 year old ambidextrous female with an underlying medical history of lumbar spinal stenosis, hip pain, status post hiatal hernia surgery, kidney stone surgery, bladder surgery, hysterectomy, breast biopsy, tonsillectomy and adenoidectomy, who presents for followup consultation of her advanced, right-sided predominant Parkinson's disease, complicated by memory loss, LBP, bladder incontinence and gait disorder and OSA, which is being treated with CPAP. She is accompanied by her husband today. I last saw her on 08/28/2015, at which time she reported ongoing issues with low back pain. She also had right shoulder pain. She had severe limitation in her range of motion. She was using a 2 wheeled walker. She was sleeping and in hospital bed. In the early morning she would shift into a lift chair. She would have to get up multiple times at night for bathroom breaks, up to 6 times per night, her husband was having more health issues himself of the past 1 year. She was trying to be compliant with CPAP therapy but AHI and leak were both high. I suggested she continue with Sinemet at the current dose, 1-1/2 pills 5 times a day.  Today, 02/25/2016: I reviewed her CPAP compliance data from 01/25/2016 through 02/23/2016 which is a total of 30 days during which time she used her machine every night with 100% compliance, average usage of 9 hours and 43 minutes, residual AHI 26.7 per hour, leaked high at 49.8 L/m for the 95th percentile on a pressure of 14 cm with EPR of 3. Residual apneas appeared to be obstructive in nature.  Today, 02/25/2016: She reports having some issues with excessive sweating after her 9 AM pills, unclear which one does it. She has a sharp pain with turning in bed, below the rib cage on the right in the back. She still has nocturia, but no recent UTI. She sees a Dealer in  [place], [place]. Memory is stable. She has not fallen. She is limited in her R shoulder mobility.  Her CPAP mask leaks. She uses a fullface mask but still opens her mouth through it. She would like to try a new mask. She needs a new set of supplies anyway.  Previously:   I saw her on 02/25/2015, at which time she reported that she was continuing to struggle with the CPAP mask. She had trouble closing her mouth. She was using a fullface mask with a chinstrap. Unfortunately, she had fallen from her recliner in December. She had been in her lift chair and fell asleep and accidentally pushed the control button. She fell forward onto her face. She did not have any LOC or residual headaches. She was on 7 1/2 pills of Sinemet daily. She was having ongoing issues with low back pain. In the past she had injection treatment with variable results, under Dr. Francesco Runner for this. She was on hydrocodone 3 times a day. She had nocturia multiple times a night which was very bothersome to her. She felt her memory and mood were stable.   I reviewed her CPAP compliance data from 07/27/2015 through 08/25/2015 which is a total of 30 days during which time she used her machine every night, with percent used days greater than 4 hours at 100%, indicating superb compliance with an average usage of 10 hours and 27 minutes, residual AHI high at 27.1 per hour, leak I with the 95th percentile at 54.5 L/m  on a pressure of 14 cm with EPR of 3. Looking at her AHI breakdown, it looks like her residual sleep disordered breathing is primarily obstructive in nature. She was supposed to switch to AutoPap therapy but appears to be still on the same setting of 14 cm.  I saw her on 08/26/2014, at which time she had some residual snoring according to her husband. She was using a fullface mask. Parkinson's-wise, she felt stable. I changed her from AutoPap to a set pressure of 14 cm.   I reviewed her CPAP compliance data from 01/25/2015 through  02/23/2015 which is a total of 30 days during which time she used her machine every night with percent used days greater than 4 hours at 100%, indicating superb compliance on her behalf with an average usage of 9 hours and 34 minutes and pressure has now been changed to 14 cm with EPR of 3 but AHI was suboptimal at 14.4, probably due to very high leak with the 95th percentile of leak at 78.6 L/m.   I saw her on 05/02/2014, at which time I talked her about her recent sleep study results and her CPAP treatment. She had done well with that. I changed her from AutoPap to CPAP of 14 cm. She changed from a nasal mask to a full face mask. I asked her to not drive anymore. I asked her to continue with her Parkinson's medications. I changed the timing to 6, 10, 2, 6 PM and 10 PM and I encouraged her to continue using CPAP regularly.  I reviewed her compliance data from 07/24/2014 through 08/22/2014 which is a total of 30 days during which time she was 100% compliant. Her residual AHI was unfortunately high at 23.5. 95th percentile pressure was 12.8 but leak was very high at times. She is supposed to be on CPAP but is still on AutoPap from 8-13 with EPR of 3.  I saw her on 02/21/2014, at which time we talked about her signs and symptoms concerning for sleep apnea and I suggested a sleep study. I also advised her to try to reduce sedating medications including her Valium as well as her hydrocodone. She was advised to use her walker at all times. She felt that physical therapy was helpful. She was asking if she could drive. She had been to the [facility] office, and was discouraged from driving. She sees Dr. Francesco Runner in [place], [place] for pain. She had been taking norco 1 pill tid and 1/2 pill bid, alternating. She is taking C/L 1 1/2 pills 5 times a day.   She has sleep study on 02/27/2014 and went over her test results with her in detail today. Her baseline sleep efficiency was 100% with 100% of stage II sleep only. She had no  significant EKG changes or periodic leg movements of sleep. Her total AHI was high at 54.5 per hour. Baseline oxygen saturation was 94%, nadir was 86%. She was titrated on CPAP from 5-8 centimeters of water pressure. She was noted to have central apneas. She was noted to have mouth opening. On a pressure of 8 cm a residual AHI was 6.5 per hour. She had no REM sleep. I placed the patient on CPAP of 9 cm. 03/20/2014 to 04/08/2014, which is a total of 21 days, during which time the patient used CPAP every day. The average usage for all days was 10 hours and 10 minutes. The percent used days greater than 4 hours was 100 %, indicating superb compliance. The  residual AHI was unfortunately high at 22.4 per hour, indicating an inadequate treatment pressure of 9 cwp with EPR of 3. Her residual AHI is primarily obstructive in nature per AHI breakdown. Air leak from the mask was also elevated with 38.1 L per minute at the 95th percentile. Earlier this month based on these results I placed her on AutoPap as opposed to CPAP.   I reviewed the patient's PAP compliance data from 04/16/2014 to 05/01/2014, which is a total of 16 days, during which time the patient used CPAP every day. The average usage for all days was 10 hours and 12 minutes. The percent used days greater than 4 hours was 100 %, indicating superb compliance. The residual AHI was unfortunately elevated at 14.9 per hour. The pressure setting is from 8-13 cm. 95th percentile pressure was 12.9. Her leak was at times high.   I saw her on 10/04/2013, at which time I did not make changes to her medications but made a referral to physical therapy and she wanted to stay locally in [place], [place], for this. We talked about potentially starting her on Exelon for memory loss.   I first met her on 05/01/2013, which time I suggested that she increase her Sinemet to 1-1/2 pills 5 times a day. I also reinforced the importance of using her walker at all times as she was at fall risk.  She called back in August 2014 and reported an improvement in her symptoms.   She previously followed with Dr. Morene Antu and was last seen by him on 01/01/2013, at which time he felt that she would benefit from physical therapy due to her lumbar spine stenosis with chronic lower back pain. He also increased her carbidopa-levodopa to 1-1/2 pills at 5 AM, one pill at 9 AM, one pill at 1 PM, 1 1/2 pills at 5 PM and one pill at 9 PM. Her falls assessment tool score at the time was 18.   She has an approximately 12-year history of Parkinson's disease treated with carbidopa-levodopa 25-100 mg strength 5 times daily. She developed lower back pain and was treated with hydrocodone, oxycodone and fentanyl patch. She also received epidural steroid injections. She had an EEG in May 2011, which did not show any epileptiform discharges. MRI L-spine in March 2013 showed scoliosis, and severe degenerative joint disease. She has had jerking movements in her legs. This does not occur while lying down but mostly with sitting or standing. She has developed bladder incontinence. In January 2014 her MMSE was 22, clock drawing was 4, animal fluency was 8.   She has a hospital bed with rails up at night. He helps her to use a bed pan. She has to urinate 2-5 times per night. She can walk about 15 feet at a time with 2 wheeled walker. She denies VH or AH and has not had dyskinesias. She lives close to [place] and had PT there. She had epidural injection at [facility] in the past, and at [facility] with good relief.    Her Past Medical History Is Significant For: Past Medical History  Diagnosis Date  . Edema   . Other chronic pulmonary heart diseases   . Paralysis agitans ([place])   . Stiffness of joint, not elsewhere classified,  shoulder region   . Abnormality of gait   . Fatigue   . Parkinson disease ([place])   . Spinal stenosis   . Parkinson's disease ([place]) 10/04/2013    Her Past Surgical History Is Significant For: No  past surgical  history on file.  Her Family History Is Significant For: No family history on file.  Her Social History Is Significant For: Social History   Social History  . Marital Status: Married    Spouse Name: Pilar Plate  . Number of Children: 3  . Years of Education: 14   Occupational History  . RETIRED    Social History Main Topics  . Smoking status: Never Smoker   . Smokeless tobacco: Never Used  . Alcohol Use: No  . Drug Use: No  . Sexual Activity: Not Asked   Other Topics Concern  . None   Social History Narrative   Patient is right handed.resides in a home with husband    Her Allergies Are:  Allergies  Allergen Reactions  . Azithromycin Diarrhea  . Lyrica [Pregabalin]     Drops blood pressure  . Tizanidine Other (See Comments)    hypotension  . Erythromycin Diarrhea  :   Her Current Medications Are:  Outpatient Encounter Prescriptions as of 02/25/2016  Medication Sig  . baclofen (LIORESAL) 10 MG tablet Take 10 mg by mouth daily.   . carbidopa-levodopa (SINEMET IR) 25-100 MG tablet TAKE 1 & 1/2 TABLET AT 5 AM, 9AM, 1 PM, 5 PM, AND 9 PM.  . Cholecalciferol (VITAMIN D3) 5000 UNITS CAPS Take 1 capsule by mouth daily.  . diazepam (VALIUM) 5 MG tablet Take 0.5 tablets (2.5 mg total) by mouth daily.  . DULoxetine (CYMBALTA) 30 MG capsule Take 30 mg by mouth daily.   Marland Kitchen FLUVIRIN INJ injection   . HYDROcodone-acetaminophen (NORCO/VICODIN) 5-325 MG per tablet Take 1 tablet by mouth every 4 (four) hours as needed for pain. Up to 4 daily,patient is taking at 9:00-1/2 pill,5:00-1/2 pill  . KRILL OIL PO Take 1 capsule by mouth daily.  . magnesium oxide (MAG-OX) 400 MG tablet Take 400 mg by mouth daily.  . Multiple Vitamins-Minerals (CENTRUM SILVER PO) Take 1 tablet by mouth daily.  . polyethylene glycol (MIRALAX / GLYCOLAX) packet Take 17 g by mouth daily.  . vitamin E 400 UNIT capsule Take 400 Units by mouth daily.  . VOLTAREN 1 % GEL Apply 1 application topically daily as needed.    No facility-administered encounter medications on file as of 02/25/2016.  :  Review of Systems:  Out of a complete 14 point review of systems, all are reviewed and negative with the exception of these symptoms as listed below:   Review of Systems  Neurological:       Patient reports no new concerns.  Patient would like to get a new mask for her CPAP, a different one from what she has now.     Objective:  Neurologic Exam  Physical Exam Physical Examination:   Filed Vitals:   02/25/16 1300  BP: 122/64  Pulse: 78  Resp: 16   General Examination: The patient is a very pleasant 75 y.o. female in no acute distress. She is situated in a chair. She brought in her 2 wheeled walker. She is in good spirits today. She reports right sided back pain, unchanged.   HEENT: Normocephalic, atraumatic, pupils are equal, round and reactive to light and accommodation. Funduscopy is normal. Mild b/l cataracts are noted. She has corrective eye glasses. Extraocular tracking shows moderate saccadic breakdown without nystagmus noted. There is limitation to upper gaze. There is moderate decrease in eye blink rate. Hearing is mildly impaired. Face is symmetric with moderate facial masking and normal facial sensation. There is a mild lower  lip tremor. Neck is moderately to severely rigid with intact passive ROM. There are no carotid bruits on auscultation. Oropharynx exam reveals moderate mouth dryness. No significant airway crowding is noted but she does have a floppy appearing soft palate. Mallampati is class II. Tongue protrudes centrally and palate elevates symmetrically. There is mild drooling.   Chest: is clear to auscultation without wheezing, rhonchi or crackles noted.  Heart: sounds are regular and normal without murmurs, rubs or gallops noted.   Abdomen: is soft, non-tender and non-distended with normal bowel sounds appreciated on auscultation.  Extremities: There is 1-2+ pitting edema in the distal  lower extremities bilaterally. Chronic stasis-like changes are noted in the distal legs bilaterally. There are no varicose veins, there is distal redness.  Skin: is warm and dry with no trophic changes noted. Age-related changes are noted on the skin.    Musculoskeletal: exam reveals no obvious joint deformities, tenderness, joint swelling or erythema, except severe decrease in ROM in R shoulder.  Neurologically:  Mental status: The patient is awake and alert, paying fair attention. She is able to partially provide the history. Her husband provides most of the history. She is oriented to: person, place, situation, day of week, month of year and year, not date. Her memory, attention, language and knowledge are mildly impaired. There is no aphasia, agnosia, apraxia or anomia. There is a mild degree of bradyphrenia.   On 08/26/2014: MMSE 25/30, CDT 2/4, AFT: 9.   On 02/25/2015: MMSE: 27/30, CDT: 3/4, AFT: 8/min, GDS: 2/15.  On 08/28/2015: MMSE: 26/30, CDT: 3/4, AFT: 5/min.   On 02/25/2016: MMSE: 29/30, CDT: 4/4, AFT: 10/min.  Speech is mild to moderately hypophonic with mild dysarthria noted. Mood is congruent and affect is normal.  Cranial nerves are as described above under HEENT exam. In addition, shoulder shrug is normal with equal shoulder height noted.  Motor exam: Normal bulk, and strength is 4/5, with limitation in ROM in the R shoulder, unchanged. There are no dyskinesias noted.  Tone is moderately rigid with presence of cogwheeling in the right upper extremity. There is overall moderate bradykinesia. There is no drift or rebound.  There is an intermittent mild to moderate resting tremor in the right upper extremity and a mild resting tremor in the left upper extremity. The tremor is intermittent. There is a slight intermittent resting tremor in both legs.  Romberg is not tested today.  Reflexes are 1+ in the upper extremities and trace in the lower extremities. Fine motor skills exam:  Finger taps are moderately to severely impaired on the right and moderately impaired on the left. Hand movements are moderately impaired on the right and moderately impaired on the left. RAP (rapid alternating patting) is moderately to severely impaired on the right and moderately impaired on the left. Foot taps are severely impaired on the right and moderately impaired on the left. Foot agility (in the form of heel stomping) is severely impaired on the right and moderately impaired on the left.    Cerebellar testing shows no dysmetria or intention tremor on finger to nose testing. There is no truncal or gait ataxia.   Sensory exam is intact to light touch in the upper and lower extremities.   Gait, station and balance: she stands with moderated difficulty and needs assistance, she has a moderately stooped posture and walks with difficulty but uses her 2 wheeled walker fairly well. She turns on her own. Her balance is mild to moderately impaired. Her gait and balance  overall appear to be stable from last time.   Assessment and Plan:   In summary, Carol Mckee is a very pleasant 75 year old female with a history of advanced R sided predominant Parkinson's disease, complicated by memory loss, gait disorder, and chronic degenerative back d/s, with need for chronic narcotic pain medication and OSA on CPAP. She is fully compliant with CPAP therapy but her leak is too high still. Her AHI is still high. We probably have achieved a 50% improvement of her sleep apnea.  She is faithful with the CPAP using commended for it. She is using a fullface mask. I will request a mask refit and we will try to increase her pressure to 15 cm and see how it goes. She continues to have problems with nocturia, at least she has not had a recent infection. She follows with a urologist in [place]. She has had more stiffness and more right shoulder problems. We will try to increase her Sinemet to 1-1/2 pills 6 times a day, at 3 hourly  intervals starting at 5 AM. She is agreeable. In addition, she is advised to gradually try to see if she can get by with distal little bit less hydrocodone at a time. She is at risk for balance problems, constipation, mouth dryness, and ltered mental status, hallucinations from being on a narcotic type pain medication in conjunction with having Parkinson's disease. I adjusted her Sinemet prescription and placed a referral to her DME comny for a mask refit, and increase in CPAP pressure. I would like to see her back in 4 months, sooner if needed.  I spent 25 minutes in total face-to-face time with the patient, more than 50% of which was spent in counseling and coordination of care, reviewing test results, reviewing medication and discussing or reviewing the diagnosis of PD and OSA, the prognosis and treatment options

## 2016-03-18 ENCOUNTER — Other Ambulatory Visit: Payer: Self-pay | Admitting: *Deleted

## 2016-03-18 DIAGNOSIS — G20 Parkinson's disease: Secondary | ICD-10-CM

## 2016-03-18 DIAGNOSIS — Z9989 Dependence on other enabling machines and devices: Principal | ICD-10-CM

## 2016-03-18 DIAGNOSIS — G4733 Obstructive sleep apnea (adult) (pediatric): Secondary | ICD-10-CM

## 2016-03-18 MED ORDER — DIAZEPAM 5 MG PO TABS: 2.5000 mg | ORAL_TABLET | Freq: Every day | ORAL | Status: DC

## 2016-03-29 ENCOUNTER — Telehealth: Payer: Self-pay | Admitting: Neurology

## 2016-03-29 DIAGNOSIS — M545 Low back pain: Principal | ICD-10-CM

## 2016-03-29 DIAGNOSIS — G8929 Other chronic pain: Secondary | ICD-10-CM

## 2016-03-29 NOTE — Telephone Encounter (Signed)
Patient states that she was seeing Dr. Laurian Brim'Toole for pain management but he is no longer with [facility]. She says that she cannot see her PCP because they are no longer in network for her. Patient would like to know if you can help refer her to a pain management doctor within [facility] (per insurance requirements).

## 2016-03-29 NOTE — Telephone Encounter (Signed)
Patient called to request that Dr. Frances FurbishAthar refer her to a Pain Mgmt Doctor. States she received letter from current pain mgmt Doctor Saturday that states "no longer seeing patient's at this hospital". Please call to advise [phone].

## 2016-03-29 NOTE — Telephone Encounter (Signed)
Entered referral to pain management, needs to be [facility] affiliated per patient request.

## 2016-03-30 NOTE — Telephone Encounter (Signed)
Called [facility] they can see patient for pain mgt. Only.  [facility].  Referral has been sent via epic. [phone]. Thanks Annabelle Harmanana.

## 2016-05-26 ENCOUNTER — Observation Stay (HOSPITAL_COMMUNITY): Payer: Medicare Other

## 2016-05-26 ENCOUNTER — Emergency Department (HOSPITAL_COMMUNITY): Payer: Medicare Other

## 2016-05-26 ENCOUNTER — Inpatient Hospital Stay (HOSPITAL_COMMUNITY)
Admission: EM | Admit: 2016-05-26 | Discharge: 2016-06-02 | DRG: 871 | Disposition: A | Discharge status: Home Health | Payer: Medicare Other | Attending: Internal Medicine | Admitting: Internal Medicine

## 2016-05-26 ENCOUNTER — Other Ambulatory Visit: Payer: Self-pay

## 2016-05-26 ENCOUNTER — Encounter (HOSPITAL_COMMUNITY): Payer: Self-pay

## 2016-05-26 DIAGNOSIS — N3000 Acute cystitis without hematuria: Secondary | ICD-10-CM | POA: Diagnosis not present

## 2016-05-26 DIAGNOSIS — N10 Acute tubulo-interstitial nephritis: Secondary | ICD-10-CM | POA: Diagnosis present

## 2016-05-26 DIAGNOSIS — G9340 Encephalopathy, unspecified: Secondary | ICD-10-CM | POA: Diagnosis not present

## 2016-05-26 DIAGNOSIS — B961 Klebsiella pneumoniae [K. pneumoniae] as the cause of diseases classified elsewhere: Secondary | ICD-10-CM | POA: Diagnosis present

## 2016-05-26 DIAGNOSIS — N390 Urinary tract infection, site not specified: Secondary | ICD-10-CM

## 2016-05-26 DIAGNOSIS — G20 Parkinson's disease: Secondary | ICD-10-CM | POA: Diagnosis present

## 2016-05-26 DIAGNOSIS — A419 Sepsis, unspecified organism: Secondary | ICD-10-CM | POA: Diagnosis present

## 2016-05-26 DIAGNOSIS — R1319 Other dysphagia: Secondary | ICD-10-CM | POA: Diagnosis present

## 2016-05-26 DIAGNOSIS — R4182 Altered mental status, unspecified: Secondary | ICD-10-CM | POA: Diagnosis not present

## 2016-05-26 DIAGNOSIS — A4159 Other Gram-negative sepsis: Secondary | ICD-10-CM | POA: Diagnosis not present

## 2016-05-26 DIAGNOSIS — I639 Cerebral infarction, unspecified: Secondary | ICD-10-CM

## 2016-05-26 DIAGNOSIS — D696 Thrombocytopenia, unspecified: Secondary | ICD-10-CM | POA: Diagnosis not present

## 2016-05-26 DIAGNOSIS — R7881 Bacteremia: Secondary | ICD-10-CM

## 2016-05-26 DIAGNOSIS — Z883 Allergy status to other anti-infective agents status: Secondary | ICD-10-CM

## 2016-05-26 DIAGNOSIS — R6520 Severe sepsis without septic shock: Secondary | ICD-10-CM | POA: Diagnosis present

## 2016-05-26 DIAGNOSIS — R0602 Shortness of breath: Secondary | ICD-10-CM

## 2016-05-26 DIAGNOSIS — R4781 Slurred speech: Secondary | ICD-10-CM

## 2016-05-26 DIAGNOSIS — Z888 Allergy status to other drugs, medicaments and biological substances status: Secondary | ICD-10-CM

## 2016-05-26 DIAGNOSIS — R404 Transient alteration of awareness: Secondary | ICD-10-CM

## 2016-05-26 DIAGNOSIS — G9341 Metabolic encephalopathy: Secondary | ICD-10-CM | POA: Diagnosis present

## 2016-05-26 DIAGNOSIS — N179 Acute kidney failure, unspecified: Secondary | ICD-10-CM | POA: Diagnosis present

## 2016-05-26 LAB — I-STAT TROPONIN, ED: TROPONIN I, POC: 0 ng/mL (ref 0.00–0.08)

## 2016-05-26 LAB — COMPREHENSIVE METABOLIC PANEL
ALT: <5 U/L — ABNORMAL LOW (ref 14–54)
AST: 34 U/L (ref 15–41)
Albumin: 3.4 g/dL — ABNORMAL LOW (ref 3.5–5.0)
Alkaline Phosphatase: 47 U/L (ref 38–126)
Anion gap: 12 (ref 5–15)
BILIRUBIN TOTAL: 0.8 mg/dL (ref 0.3–1.2)
BUN: 28 mg/dL — AB (ref 6–20)
CHLORIDE: 104 mmol/L (ref 101–111)
CO2: 19 mmol/L — ABNORMAL LOW (ref 22–32)
CREATININE: 1.57 mg/dL — AB (ref 0.44–1.00)
Calcium: 8.3 mg/dL — ABNORMAL LOW (ref 8.9–10.3)
GFR, EST AFRICAN AMERICAN: 36 mL/min — AB (ref >60)
GFR, EST NON AFRICAN AMERICAN: 31 mL/min — AB (ref >60)
Glucose, Bld: 172 mg/dL — ABNORMAL HIGH (ref 65–99)
POTASSIUM: 3.7 mmol/L (ref 3.5–5.1)
Sodium: 135 mmol/L (ref 135–145)
TOTAL PROTEIN: 6.1 g/dL — AB (ref 6.5–8.1)

## 2016-05-26 LAB — CBC WITH DIFFERENTIAL/PLATELET
Basophils Absolute: 0 10*3/uL (ref 0.0–0.1)
Basophils Relative: 0 %
EOS ABS: 0 10*3/uL (ref 0.0–0.7)
EOS PCT: 0 %
HEMATOCRIT: 37.2 % (ref 36.0–46.0)
Hemoglobin: 12.3 g/dL (ref 12.0–15.0)
LYMPHS ABS: 0.3 10*3/uL — AB (ref 0.7–4.0)
LYMPHS PCT: 3 %
MCH: 33.4 pg (ref 26.0–34.0)
MCHC: 33.1 g/dL (ref 30.0–36.0)
MCV: 101.1 fL — AB (ref 78.0–100.0)
MONO ABS: 0.7 10*3/uL (ref 0.1–1.0)
MONOS PCT: 8 %
Neutro Abs: 8.8 10*3/uL — ABNORMAL HIGH (ref 1.7–7.7)
Neutrophils Relative %: 90 %
PLATELETS: 91 10*3/uL — AB (ref 150–400)
RBC: 3.68 MIL/uL — ABNORMAL LOW (ref 3.87–5.11)
RDW: 13.3 % (ref 11.5–15.5)
WBC: 9.8 10*3/uL (ref 4.0–10.5)

## 2016-05-26 LAB — I-STAT CHEM 8, ED
BUN: 29 mg/dL — ABNORMAL HIGH (ref 6–20)
CHLORIDE: 101 mmol/L (ref 101–111)
CREATININE: 1.5 mg/dL — AB (ref 0.44–1.00)
Calcium, Ion: 1.08 mmol/L — ABNORMAL LOW (ref 1.13–1.30)
GLUCOSE: 167 mg/dL — AB (ref 65–99)
HCT: 39 % (ref 36.0–46.0)
Hemoglobin: 13.3 g/dL (ref 12.0–15.0)
POTASSIUM: 4 mmol/L (ref 3.5–5.1)
SODIUM: 139 mmol/L (ref 135–145)
TCO2: 21 mmol/L (ref 0–100)

## 2016-05-26 LAB — URINALYSIS, ROUTINE W REFLEX MICROSCOPIC
BILIRUBIN URINE: NEGATIVE
Glucose, UA: NEGATIVE mg/dL
NITRITE: POSITIVE — AB
PH: 5.5 (ref 5.0–8.0)
Protein, ur: 100 mg/dL — AB
SPECIFIC GRAVITY, URINE: 1.025 (ref 1.005–1.030)

## 2016-05-26 LAB — LACTIC ACID, PLASMA: LACTIC ACID, VENOUS: 5.1 mmol/L — AB (ref 0.5–2.0)

## 2016-05-26 LAB — RAPID URINE DRUG SCREEN, HOSP PERFORMED
Amphetamines: NOT DETECTED
Barbiturates: NOT DETECTED
Benzodiazepines: POSITIVE — AB
COCAINE: NOT DETECTED
OPIATES: POSITIVE — AB
Tetrahydrocannabinol: NOT DETECTED

## 2016-05-26 LAB — CK: Total CK: 46 U/L (ref 38–234)

## 2016-05-26 LAB — URINE MICROSCOPIC-ADD ON

## 2016-05-26 LAB — PROTIME-INR
INR: 1.45 (ref 0.00–1.49)
Prothrombin Time: 17.8 seconds — ABNORMAL HIGH (ref 11.6–15.2)

## 2016-05-26 LAB — I-STAT CG4 LACTIC ACID, ED: Lactic Acid, Venous: 6.27 mmol/L (ref 0.5–2.0)

## 2016-05-26 LAB — APTT: APTT: 32 s (ref 24–37)

## 2016-05-26 LAB — TROPONIN I

## 2016-05-26 LAB — ETHANOL: Alcohol, Ethyl (B): <5 mg/dL (ref <5)

## 2016-05-26 MED ORDER — ALBUTEROL SULFATE (2.5 MG/3ML) 0.083% IN NEBU
2.5000 mg | INHALATION_SOLUTION | RESPIRATORY_TRACT | Status: DC | PRN
  Administered 2016-05-29: 2.5 mg via RESPIRATORY_TRACT
  Filled 2016-05-26: qty 3

## 2016-05-26 MED ORDER — DEXTROSE 5 % IV SOLN
1.0000 g | INTRAVENOUS | Status: DC
  Filled 2016-05-26: qty 10

## 2016-05-26 MED ORDER — SODIUM CHLORIDE 0.9 % IV BOLUS (SEPSIS)
1000.0000 mL | Freq: Once | INTRAVENOUS | Status: AC
  Administered 2016-05-26: 1000 mL via INTRAVENOUS

## 2016-05-26 MED ORDER — VANCOMYCIN HCL 10 G IV SOLR
1500.0000 mg | Freq: Once | INTRAVENOUS | Status: DC
  Filled 2016-05-26: qty 1500

## 2016-05-26 MED ORDER — SODIUM CHLORIDE 0.9 % IV BOLUS (SEPSIS)
500.0000 mL | Freq: Once | INTRAVENOUS | Status: AC
  Administered 2016-05-26: 500 mL via INTRAVENOUS

## 2016-05-26 MED ORDER — SODIUM CHLORIDE 0.9% FLUSH
3.0000 mL | Freq: Two times a day (BID) | INTRAVENOUS | Status: DC
  Administered 2016-05-26 – 2016-06-02 (×4): 3 mL via INTRAVENOUS

## 2016-05-26 MED ORDER — VITAMIN D3 125 MCG (5000 UT) PO CAPS: 1.0000 | ORAL_CAPSULE | Freq: Every day | ORAL | Status: DC

## 2016-05-26 MED ORDER — SODIUM CHLORIDE 0.9 % IV SOLN: 250.0000 mL | INTRAVENOUS | Status: DC | PRN

## 2016-05-26 MED ORDER — ACETAMINOPHEN 325 MG PO TABS
650.0000 mg | ORAL_TABLET | Freq: Four times a day (QID) | ORAL | Status: DC | PRN
  Administered 2016-05-27 – 2016-05-31 (×7): 650 mg via ORAL
  Filled 2016-05-26 (×7): qty 2

## 2016-05-26 MED ORDER — ACETAMINOPHEN 650 MG RE SUPP
650.0000 mg | Freq: Four times a day (QID) | RECTAL | Status: DC | PRN
  Administered 2016-05-27 – 2016-05-30 (×3): 650 mg via RECTAL
  Filled 2016-05-26 (×3): qty 1

## 2016-05-26 MED ORDER — SODIUM CHLORIDE 0.9% FLUSH
3.0000 mL | Freq: Two times a day (BID) | INTRAVENOUS | Status: DC
  Administered 2016-06-01: 3 mL via INTRAVENOUS

## 2016-05-26 MED ORDER — ONDANSETRON HCL 4 MG PO TABS: 4.0000 mg | ORAL_TABLET | Freq: Four times a day (QID) | ORAL | Status: DC | PRN

## 2016-05-26 MED ORDER — DULOXETINE HCL 30 MG PO CPEP
30.0000 mg | ORAL_CAPSULE | Freq: Every day | ORAL | Status: DC
  Administered 2016-05-27 – 2016-06-02 (×7): 30 mg via ORAL
  Filled 2016-05-26 (×7): qty 1

## 2016-05-26 MED ORDER — VANCOMYCIN HCL 10 G IV SOLR: 1250.0000 mg | INTRAVENOUS | Status: DC

## 2016-05-26 MED ORDER — VITAMIN E 180 MG (400 UNIT) PO CAPS
400.0000 [IU] | ORAL_CAPSULE | Freq: Every day | ORAL | Status: DC
  Administered 2016-05-27 – 2016-06-01 (×6): 400 [IU] via ORAL
  Filled 2016-05-26 (×9): qty 1

## 2016-05-26 MED ORDER — BACLOFEN 10 MG PO TABS
10.0000 mg | ORAL_TABLET | Freq: Every day | ORAL | Status: DC
  Administered 2016-05-27 – 2016-06-02 (×7): 10 mg via ORAL
  Filled 2016-05-26 (×7): qty 1

## 2016-05-26 MED ORDER — ADULT MULTIVITAMIN W/MINERALS CH
1.0000 | ORAL_TABLET | Freq: Every day | ORAL | Status: DC
  Administered 2016-05-27 – 2016-06-02 (×7): 1 via ORAL
  Filled 2016-05-26 (×7): qty 1

## 2016-05-26 MED ORDER — MORPHINE SULFATE (PF) 2 MG/ML IV SOLN
2.0000 mg | INTRAVENOUS | Status: DC | PRN
  Administered 2016-05-26 – 2016-06-02 (×8): 2 mg via INTRAVENOUS
  Filled 2016-05-26 (×8): qty 1

## 2016-05-26 MED ORDER — PIPERACILLIN-TAZOBACTAM 3.375 G IVPB: 3.3750 g | Freq: Three times a day (TID) | INTRAVENOUS | Status: DC

## 2016-05-26 MED ORDER — VITAMIN D 1000 UNITS PO TABS
5000.0000 [IU] | ORAL_TABLET | Freq: Every day | ORAL | Status: DC
  Administered 2016-05-27 – 2016-06-02 (×7): 5000 [IU] via ORAL
  Filled 2016-05-26 (×7): qty 5

## 2016-05-26 MED ORDER — VANCOMYCIN HCL IN DEXTROSE 1-5 GM/200ML-% IV SOLN: 1000.0000 mg | Freq: Once | INTRAVENOUS | Status: DC

## 2016-05-26 MED ORDER — CENTRUM SILVER PO TABS: ORAL_TABLET | Freq: Every day | ORAL | Status: DC

## 2016-05-26 MED ORDER — PIPERACILLIN-TAZOBACTAM 3.375 G IVPB 30 MIN
3.3750 g | Freq: Once | INTRAVENOUS | Status: AC
  Administered 2016-05-26: 3.375 g via INTRAVENOUS
  Filled 2016-05-26: qty 50

## 2016-05-26 MED ORDER — SENNA 8.6 MG PO TABS
1.0000 | ORAL_TABLET | Freq: Two times a day (BID) | ORAL | Status: DC
  Administered 2016-05-27 – 2016-06-02 (×13): 8.6 mg via ORAL
  Filled 2016-05-26 (×13): qty 1

## 2016-05-26 MED ORDER — TRAZODONE HCL 50 MG PO TABS
50.0000 mg | ORAL_TABLET | Freq: Every evening | ORAL | Status: DC | PRN
  Administered 2016-05-31 – 2016-06-01 (×2): 50 mg via ORAL
  Filled 2016-05-26 (×2): qty 1

## 2016-05-26 MED ORDER — HEPARIN SODIUM (PORCINE) 5000 UNIT/ML IJ SOLN
5000.0000 [IU] | Freq: Three times a day (TID) | INTRAMUSCULAR | Status: DC
  Administered 2016-05-27 – 2016-05-30 (×9): 5000 [IU] via SUBCUTANEOUS
  Filled 2016-05-26 (×8): qty 1

## 2016-05-26 MED ORDER — SODIUM CHLORIDE 0.9 % IV BOLUS (SEPSIS): 500.0000 mL | Freq: Once | INTRAVENOUS | Status: DC

## 2016-05-26 MED ORDER — ONDANSETRON HCL 4 MG/2ML IJ SOLN: 4.0000 mg | Freq: Four times a day (QID) | INTRAMUSCULAR | Status: DC | PRN

## 2016-05-26 MED ORDER — MAGNESIUM OXIDE 400 MG PO TABS: 400.0000 mg | ORAL_TABLET | Freq: Every day | ORAL | Status: DC

## 2016-05-26 MED ORDER — SODIUM CHLORIDE 0.9% FLUSH: 3.0000 mL | INTRAVENOUS | Status: DC | PRN

## 2016-05-26 MED ORDER — OXYCODONE HCL 5 MG PO TABS
5.0000 mg | ORAL_TABLET | ORAL | Status: DC | PRN
  Administered 2016-05-27 – 2016-06-02 (×12): 5 mg via ORAL
  Filled 2016-05-26 (×12): qty 1

## 2016-05-26 MED ORDER — MAGNESIUM OXIDE 400 (241.3 MG) MG PO TABS: 400.0000 mg | ORAL_TABLET | Freq: Every day | ORAL | Status: DC

## 2016-05-26 MED ORDER — ASPIRIN EC 81 MG PO TBEC
81.0000 mg | DELAYED_RELEASE_TABLET | Freq: Every day | ORAL | Status: DC
  Administered 2016-05-27 – 2016-05-30 (×4): 81 mg via ORAL
  Filled 2016-05-26 (×4): qty 1

## 2016-05-26 MED ORDER — POLYETHYLENE GLYCOL 3350 17 G PO PACK: 17.0000 g | PACK | Freq: Every day | ORAL | Status: DC | PRN

## 2016-05-26 MED ORDER — ASPIRIN 300 MG RE SUPP
300.0000 mg | Freq: Once | RECTAL | Status: AC
Start: 2016-05-26 — End: 2016-05-26
  Administered 2016-05-26: 300 mg via RECTAL
  Filled 2016-05-26: qty 1

## 2016-05-26 MED ORDER — POLYETHYLENE GLYCOL 3350 17 G PO PACK
17.0000 g | PACK | Freq: Every day | ORAL | Status: DC
  Administered 2016-05-27 – 2016-06-02 (×7): 17 g via ORAL
  Filled 2016-05-26 (×7): qty 1

## 2016-05-26 MED ORDER — MAGNESIUM OXIDE 400 (241.3 MG) MG PO TABS
400.0000 mg | ORAL_TABLET | Freq: Every day | ORAL | Status: DC
  Administered 2016-05-27 – 2016-06-02 (×7): 400 mg via ORAL
  Filled 2016-05-26 (×7): qty 1

## 2016-05-26 MED ORDER — CARBIDOPA-LEVODOPA 25-100 MG PO TABS
1.5000 | ORAL_TABLET | Freq: Every day | ORAL | Status: DC
  Administered 2016-05-27 – 2016-06-02 (×37): 1.5 via ORAL
  Filled 2016-05-26 (×10): qty 2
  Filled 2016-05-26: qty 1.5
  Filled 2016-05-26 (×2): qty 2
  Filled 2016-05-26: qty 1
  Filled 2016-05-26 (×18): qty 2

## 2016-05-26 MED ORDER — LORAZEPAM 2 MG/ML IJ SOLN
1.0000 mg | Freq: Four times a day (QID) | INTRAMUSCULAR | Status: DC | PRN
  Administered 2016-05-26 – 2016-05-29 (×3): 1 mg via INTRAVENOUS
  Filled 2016-05-26 (×3): qty 1

## 2016-05-26 NOTE — Progress Notes (Signed)
Pharmacy Antibiotic Note  Carol DresserLeola Mckee is a 75 y.o. female admitted on 05/26/2016 with sepsis.  Pharmacy has been consulted for Vancomycin & Zosyn dosing.  Plan: Vancomycin 1500 mg IV loading dose, then 1250 mg IV every 24 hours Zosyn 3.375 GM IV every 8 hours Vancomycin trough at steady state Monitor renal function   Temp (24hrs), Avg:100.9 F (38.3 C), Min:99.1 F (37.3 C), Max:101.7 F (38.7 C)   Recent Labs Lab 05/26/16 1535 05/26/16 1606 05/26/16 1802  WBC 9.8  --   --   CREATININE 1.57* 1.50*  --   LATICACIDVEN  --   --  6.27*    CrCl cannot be calculated (Unknown ideal weight.).    Allergies  Allergen Reactions  . Azithromycin Diarrhea  . Lyrica [Pregabalin]     Drops blood pressure  . Tizanidine Other (See Comments)    hypotension  . Erythromycin Diarrhea    Antimicrobials this admission: Vancomycin  6/21 >>  Zosyn 6/21 >>   Dose adjustments this admission: None    Thank you for allowing pharmacy to be a part of this patient's care.  Josephine Igoittman, Zaylei Mullane Bennett 05/26/2016 6:45 PM

## 2016-05-26 NOTE — ED Notes (Signed)
Paged Code Stroke at 15:54; called SOC at 15:57

## 2016-05-26 NOTE — Progress Notes (Signed)
CRITICAL VALUE ALERT  Critical value received:  Lacitic Acid 5.1    Date of notification:  05/26/2016  Time of notification:  2301  Critical value read back:Yes.    Nurse who received alert:  Lenice LlamasJennifer Tovah Slavick, RN   MD notified (1st page):  Craige CottaKirby  Time of first page:  2304  MD notified (2nd page):  Time of second page:  Responding MD:  Craige CottaKirby  Time MD responded:  2307

## 2016-05-26 NOTE — ED Notes (Signed)
Teleneuro in progress. 

## 2016-05-26 NOTE — ED Provider Notes (Signed)
CSN: [id number]     Arrival date & time 05/26/16  1512 History   First MD Initiated Contact with Patient 05/26/16 1524     Chief Complaint  Patient presents with  . Fatigue     (Consider location/radiation/quality/duration/timing/severity/associated sxs/prior Treatment) HPI Comments: Patient presents by EMS from home with generalized weakness. history difficult to obtain due to her Parkinson's and difficulties he has stuttering. By report she didn't sleep well last night and has had foul-smelling urine today. She is oriented 3 and denies pain. She states she is mostly walks with a walker. Denies any fever, chills, nausea, vomiting, abdominal pain, chest pain or shortness of breath. No family available.  The history is provided by the patient and the EMS personnel. The history is limited by the condition of the patient.    Past Medical History  Diagnosis Date  . Edema   . Other chronic pulmonary heart diseases   . Paralysis agitans (HCC)   . Stiffness of joint, not elsewhere classified,  shoulder region   . Abnormality of gait   . Fatigue   . Parkinson disease (HCC)   . Spinal stenosis   . Parkinson's disease (HCC) 10/04/2013   Past Surgical History  Procedure Laterality Date  . Abdominal hysterectomy     No family history on file. Social History  Substance Use Topics  . Smoking status: Never Smoker   . Smokeless tobacco: Never Used  . Alcohol Use: No   OB History    No data available     Review of Systems  Constitutional: Positive for chills, activity change, appetite change and fatigue. Negative for fever.  HENT: Negative for congestion.   Respiratory: Negative for cough, chest tightness and shortness of breath.   Cardiovascular: Negative for chest pain and leg swelling.  Gastrointestinal: Negative for nausea, vomiting and abdominal pain.  Genitourinary: Positive for dysuria. Negative for vaginal bleeding and vaginal discharge.  Musculoskeletal: Negative for  myalgias and arthralgias.  Skin: Negative for rash.  Neurological: Negative for dizziness, weakness and headaches.  A complete 10 system review of systems was obtained and all systems are negative except as noted in the HPI and PMH.      Allergies  Azithromycin; Lyrica; Tizanidine; and Erythromycin  Home Medications   Prior to Admission medications   Medication Sig Start Date End Date Taking? Authorizing Provider  baclofen (LIORESAL) 10 MG tablet Take 10 mg by mouth daily.  04/17/13   Historical Provider, MD  carbidopa-levodopa (SINEMET IR) 25-100 MG tablet Take 1.5 tablets by mouth 6 (six) times daily. 02/25/16   Huston Foley, MD  Cholecalciferol (VITAMIN D3) 5000 UNITS CAPS Take 1 capsule by mouth daily.    Historical Provider, MD  diazepam (VALIUM) 5 MG tablet Take 0.5 tablets (2.5 mg total) by mouth daily. 03/18/16   Asa Lente, MD  DULoxetine (CYMBALTA) 30 MG capsule Take 30 mg by mouth daily.  04/05/13   Historical Provider, MD  FLUVIRIN INJ injection  09/21/13   Historical Provider, MD  HYDROcodone-acetaminophen (NORCO/VICODIN) 5-325 MG per tablet Take 1 tablet by mouth every 4 (four) hours as needed for pain. Up to 4 daily,patient is taking at 9:00-1/2 pill,5:00-1/2 pill    Historical Provider, MD  KRILL OIL PO Take 1 capsule by mouth daily.    Historical Provider, MD  magnesium oxide (MAG-OX) 400 MG tablet Take 400 mg by mouth daily.    Historical Provider, MD  Multiple Vitamins-Minerals (CENTRUM SILVER PO) Take 1 tablet by mouth daily.  Historical Provider, MD  polyethylene glycol (MIRALAX / GLYCOLAX) packet Take 17 g by mouth daily.    Historical Provider, MD  vitamin E 400 UNIT capsule Take 400 Units by mouth daily.    Historical Provider, MD  VOLTAREN 1 % GEL Apply 1 application topically daily as needed. 08/30/13   Historical Provider, MD   BP 108/56 mmHg  Pulse 101  Temp(Src) 99.1 F (37.3 C) (Oral)  Resp 20  SpO2 93% Physical Exam  Constitutional: She is oriented  to person, place, and time. She appears well-developed and well-nourished. No distress.  Stuttering speech, resting tremor Feels warm  HENT:  Head: Normocephalic and atraumatic.  Mouth/Throat: Oropharynx is clear and moist. No oropharyngeal exudate.  Eyes: Conjunctivae and EOM are normal. Pupils are equal, round, and reactive to light.  Neck: Normal range of motion. Neck supple.  No meningismus.  Cardiovascular: Normal rate, regular rhythm, normal heart sounds and intact distal pulses.   No murmur heard. Pulmonary/Chest: Effort normal and breath sounds normal. No respiratory distress.  Abdominal: Soft. There is no tenderness. There is no rebound and no guarding.  Musculoskeletal: Normal range of motion. She exhibits no edema or tenderness.  Neurological: She is alert and oriented to person, place, and time. No cranial nerve deficit. She exhibits normal muscle tone. Coordination normal.  Follows commands, resting tremor, equal strength throughout, cogwheel rigidity throughout extremities  Skin: Skin is warm.  Psychiatric: She has a normal mood and affect. Her behavior is normal.  Nursing note and vitals reviewed.   ED Course  Procedures (including critical care time) Labs Review Labs Reviewed  URINALYSIS, ROUTINE W REFLEX MICROSCOPIC (NOT AT [facility]) - Abnormal; Notable for the following:    APPearance HAZY (*)    Hgb urine dipstick LARGE (*)    Ketones, ur TRACE (*)    Protein, ur 100 (*)    Nitrite POSITIVE (*)    Leukocytes, UA MODERATE (*)    All other components within normal limits  CBC WITH DIFFERENTIAL/PLATELET - Abnormal; Notable for the following:    RBC 3.68 (*)    MCV 101.1 (*)    Platelets 91 (*)    Neutro Abs 8.8 (*)    Lymphs Abs 0.3 (*)    All other components within normal limits  COMPREHENSIVE METABOLIC PANEL - Abnormal; Notable for the following:    CO2 19 (*)    Glucose, Bld 172 (*)    BUN 28 (*)    Creatinine, Ser 1.57 (*)    Calcium 8.3 (*)    Total  Protein 6.1 (*)    Albumin 3.4 (*)    ALT <5 (*)    GFR calc non Af Amer 31 (*)    GFR calc Af Amer 36 (*)    All other components within normal limits  PROTIME-INR - Abnormal; Notable for the following:    Prothrombin Time 17.8 (*)    All other components within normal limits  URINE RAPID DRUG SCREEN, HOSP PERFORMED - Abnormal; Notable for the following:    Opiates POSITIVE (*)    Benzodiazepines POSITIVE (*)    All other components within normal limits  URINE MICROSCOPIC-ADD ON - Abnormal; Notable for the following:    Squamous Epithelial / LPF 0-5 (*)    Bacteria, UA MANY (*)    All other components within normal limits  I-STAT CHEM 8, ED - Abnormal; Notable for the following:    BUN 29 (*)    Creatinine, Ser 1.50 (*)  Glucose, Bld 167 (*)    Calcium, Ion 1.08 (*)    All other components within normal limits  I-STAT CG4 LACTIC ACID, ED - Abnormal; Notable for the following:    Lactic Acid, Venous 6.27 (*)    All other components within normal limits  URINE CULTURE  CULTURE, BLOOD (ROUTINE X 2)  CULTURE, BLOOD (ROUTINE X 2)  CULTURE, BLOOD (ROUTINE X 2)  CULTURE, BLOOD (ROUTINE X 2)  TROPONIN I  ETHANOL  APTT  BASIC METABOLIC PANEL  CBC  I-STAT TROPOININ, ED  I-STAT CG4 LACTIC ACID, ED    Imaging Review Dg Chest Port 1 View  05/26/2016  CLINICAL DATA:  Generalized weakness. EXAM: PORTABLE CHEST 1 VIEW COMPARISON:  None. FINDINGS: Low lung volumes. Heart is upper limits normal in size. No confluent opacities or effusions. No acute bony abnormality. IMPRESSION: Low lung volumes.  No active disease. Electronically Signed   By: Charlett Nose M.D.   On: 05/26/2016 17:10   Ct Head Code Stroke W/o Cm  05/26/2016  CLINICAL DATA:  Parkinson's. Stuttering speech. Difficulty walking. EXAM: CT HEAD WITHOUT CONTRAST TECHNIQUE: Contiguous axial images were obtained from the base of the skull through the vertex without intravenous contrast. COMPARISON:  CT 04/19/2012 FINDINGS: Image  quality degraded by motion Ill-defined hypodensity left posterior frontal lobe. This is consistent with artifact of indeterminate age. This was not present previously. Hypodensity in the left anterior frontal lobe also consistent with ischemia, of indeterminate age. There is chronic ischemia in the left basal ganglia. Generalized atrophy. Mild chronic ischemic change in the right frontal white matter. Negative for hemorrhage or mass.  No shift of the midline structures No skull lesion identified. IMPRESSION: Image quality degraded by motion Left frontal hypodensities could represent acute or chronic ischemia. Correlate with symptoms and MRI. Progressive chronic ischemic changes in the left basal ganglia. These results were called by telephone at the time of interpretation on 05/26/2016 at 4:23 pm to Dr. Glynn Octave , who verbally acknowledged these results. Electronically Signed   By: Marlan Palau M.D.   On: 05/26/2016 16:23   I have personally reviewed and evaluated these images and lab results as part of my medical decision-making.   EKG Interpretation   Date/Time:  Wednesday May 26 2016 15:38:51 EDT Ventricular Rate:  101 PR Interval:    QRS Duration: 90 QT Interval:  326 QTC Calculation: 423 R Axis:   28 Text Interpretation:  Sinus tachycardia Low voltage, precordial leads No  previous ECGs available Confirmed by Manus Gunning  MD, Zachary Lovins [phone]) on  05/26/2016 5:27:25 PM      MDM   Final diagnoses:  Acute encephalopathy  Urinary tract infection without hematuria, site unspecified  Sepsis, due to unspecified organism [facility])  Slurred speech   Patient presents from home with generalized weakness, difficulty speaking and difficulty with ambulation. She has Parkinson's disease. Suspect that she is febrile.  Family has arrived and states that she was normal when she woke up this morning around 7:30. At about 9:30 her husband noted she was having trouble getting her words out and had a  shuffling gait more than usual.  Patient arrives to ED around 3:15. Family did not arrive until 40. At that time with a new history, code stroke was activated as patient was 6-1/2 hours from her last seen normal. CT with frontal lucencies, possible acute versus chronic infarct per Dr. Chestine Spore.  Neurology consult complete by Dr. Berdie Ogren. Last seen normal was apparently sometime during the night  as she was restless throughout the night. He agrees probable left acute CVA given lucency on CT but is also concern about toxic metabolic encephalopathy. Not a candidate for IV TPA or intervention as she is greater than 6 hours out. Recommends admission for stroke workup.  Lactate 6. Blood and urine cultures obtained. UA appears infected.  Broad spectrum antibiotics started. IVF given per protocol. BP has improved to 130 systolic.  Patient smiling and interactive with family.  Admission d/w Dr. Mariea Clonts.  CRITICAL CARE Performed by: Glynn Octave Total critical care time: 40 minutes Critical care time was exclusive of separately billable procedures and treating other patients. Critical care was necessary to treat or prevent imminent or life-threatening deterioration. Critical care was time spent personally by me on the following activities: development of treatment plan with patient and/or surrogate as well as nursing, discussions with consultants, evaluation of patient's response to treatment, examination of patient, obtaining history from patient or surrogate, ordering and performing treatments and interventions, ordering and review of laboratory studies, ordering and review of radiographic studies, pulse oximetry and re-evaluation of patient's condition.    Glynn Octave, MD 05/27/16 [phone]

## 2016-05-26 NOTE — ED Notes (Signed)
EMS reports pt c/o generalized weakness, didn't sleep well last night, and has had strong smelling and dark urine today.  CBG was 234 per ems.  PT alert, answers questions appropriately.

## 2016-05-26 NOTE — H&P (Signed)
Patient Demographics:    Carol Mckee, is a 75 y.o. female  MRN: [id number]   DOB - Oct 11, 1941  Admit Date - 05/26/2016  Outpatient Primary MD for the patient is [name], MD   Assessment & Plan:    Principal Problem:   Severe sepsis [facility]) Active Problems:   Parkinson's disease ([place])   Encephalopathy, metabolic   Encephalopathy acute    1)Severe Sepsis From urinary source- treat empirically with IV Rocephin pending urine and blood cultures, lactic acid was elevated over 6, patient receive IV fluid bolus per sepsis protocol, repeat lactic acid pending. Check CPK, ??? Significant Muscle tremors in the setting of Parkinson's maybe contributing to elevated lactic acid levels, patient has no leukocytosis. She did have a fever of 101, mild tachycardic in the setting of fever, she was tachypneic with resp rate around 30. Blood pressure stable  2)Metabolic encephalopathy-most likely secondary to #1 above, rule out acute stroke, CT head with possible left frontal findings however pictures with motion degraded. MRI of the brain for stroke rule out depending  3)FEN/Dysphagia- may be related to worsening sinus symptoms, also probably related to #2 above, patient failed a bedside swallow eval keep nothing by mouth until official swallow eval can be done in am. MRI brain pending as #2 above  4)Parkinson's Disease- worsening motor related symptoms at this time, restart Sinemet and baclofen when able to restart her intake after swallow eval. Given lorazepam when necessary. Check CPK,  5) UTI- uncomplicated UTI with sepsis, treat as above in #1, hydrate as above in 1  6)Disposition- await MRI brain to rule out acute stroke, get physical therapy evaluation  With History of - Reviewed by me  Past Medical History    Diagnosis Date  . Edema   . Other chronic pulmonary heart diseases   . Paralysis agitans ([place])   . Stiffness of joint, not elsewhere classified,  shoulder region   . Abnormality of gait   . Fatigue   . Parkinson disease ([place])   . Spinal stenosis   . Parkinson's disease ([place]) 10/04/2013      Past Surgical History  Procedure Laterality Date  . Abdominal hysterectomy        Chief Complaint  Patient presents with  . Fatigue      HPI:    Carol Mckee  is a 75 y.o. female, With past medical history relevant for Parkinson's disease presents with altered mentation and speech problems and strong odor to urine with dysuria. Patient had fever, chills malaise and fatigue, Additional history obtained from patient's daughter at bedside as patient has significant speech problems due to worsening parkinsonian symptoms. Patient with fever over 101, with dysuria, no flank pain, no vomiting, no diarrhea, no abdominal pain no chest pains or palpitations no dizziness. In the ED patient met sepsis criteria, sepsis bundle initiated, repeat lactic acid is pending    Review of systems:  In addition to the HPI above,   A full 12 point Review of Systems was done, except as stated above, all other Review of Systems were negative.    Social History:  Reviewed by me    Social History  Substance Use Topics  . Smoking status: Never Smoker   . Smokeless tobacco: Never Used  . Alcohol Use: No       Family History :  Reviewed by me   History reviewed. No pertinent family history.    Home Medications:   Prior to Admission medications   Medication Sig Start Date End Date Taking? Authorizing Provider  baclofen (LIORESAL) 10 MG tablet Take 10 mg by mouth daily.  04/17/13  Yes Historical Provider, MD  carbidopa-levodopa (SINEMET IR) 25-100 MG tablet Take 1.5 tablets by mouth 6 (six) times daily. 02/25/16  Yes Star Age, MD  Cholecalciferol (VITAMIN D3) 5000 UNITS CAPS Take 1 capsule by mouth  daily.   Yes Historical Provider, MD  diazepam (VALIUM) 5 MG tablet Take 0.5 tablets (2.5 mg total) by mouth daily. 03/18/16  Yes Britt Bottom, MD  DULoxetine (CYMBALTA) 30 MG capsule Take 30 mg by mouth daily.  04/05/13  Yes Historical Provider, MD  HYDROcodone-acetaminophen (NORCO/VICODIN) 5-325 MG per tablet Take 0.5-1 tablets by mouth 6 (six) times daily. TAKES ONE TABLET IN THE MORNING AND TAKES ONE-HALF TABLET 5 TIMES DAILY   Yes Historical Provider, MD  KRILL OIL PO Take 1 capsule by mouth daily.   Yes Historical Provider, MD  magnesium oxide (MAG-OX) 400 MG tablet Take 400 mg by mouth daily.   Yes Historical Provider, MD  Multiple Vitamins-Minerals (CENTRUM SILVER PO) Take 1 tablet by mouth daily.   Yes Historical Provider, MD  polyethylene glycol (MIRALAX / GLYCOLAX) packet Take 17 g by mouth daily.   Yes Historical Provider, MD  vitamin E 400 UNIT capsule Take 400 Units by mouth daily.   Yes Historical Provider, MD  VOLTAREN 1 % GEL Apply 1 application topically daily as needed (for pain).  08/30/13  Yes Historical Provider, MD     Allergies:     Allergies  Allergen Reactions  . Azithromycin Diarrhea  . Lyrica [Pregabalin]     Drops blood pressure  . Tizanidine Other (See Comments)    hypotension  . Erythromycin Diarrhea     Physical Exam:   Vitals  Blood pressure 152/53, pulse 71, temperature 99.1 F (37.3 C), temperature source Oral, resp. rate 18, height '5\' 7"'$  (1.702 m), weight 97 kg (213 lb 13.5 oz), SpO2 96 %.  Physical Examination: General appearance - alert, and in no distress  Mental status - alert, oriented to person, place, and time,  Eyes - sclera anicteric Neck - supple, no JVD elevation , Chest - clear  to auscultation bilaterally, symmetrical air movement,  Heart - S1 and S2 normal,  Abdomen - soft, nontender, nondistended, no masses or organomegaly, no CVA area tenderness Neurological - screening mental status exam normal, parkinsonian tremors with no  new focal neurological deficits Extremities - no pedal edema noted, intact peripheral pulses , right foot is cooler than left however pedal pulses are good , capillary refill is good Skin - no rash   Data Review:    CBC  Recent Labs Lab 05/26/16 1535 05/26/16 1606  WBC 9.8  --   HGB 12.3 13.3  HCT 37.2 39.0  PLT 91*  --   MCV 101.1*  --   MCH 33.4  --   MCHC 33.1  --  RDW 13.3  --   LYMPHSABS 0.3*  --   MONOABS 0.7  --   EOSABS 0.0  --   BASOSABS 0.0  --    ------------------------------------------------------------------------------------------------------------------  Chemistries   Recent Labs Lab 05/26/16 1535 05/26/16 1606  NA 135 139  K 3.7 4.0  CL 104 101  CO2 19*  --   GLUCOSE 172* 167*  BUN 28* 29*  CREATININE 1.57* 1.50*  CALCIUM 8.3*  --   AST 34  --   ALT <5*  --   ALKPHOS 47  --   BILITOT 0.8  --    ------------------------------------------------------------------------------------------------------------------ estimated creatinine clearance is 37.6 mL/min (by C-G formula based on Cr of 1.5). ------------------------------------------------------------------------------------------------------------------ No results for input(s): TSH, T4TOTAL, T3FREE, THYROIDAB in the last 72 hours.  Invalid input(s): FREET3   Coagulation profile  Recent Labs Lab 05/26/16 1630  INR 1.45   ------------------------------------------------------------------------------------------------------------------- No results for input(s): DDIMER in the last 72 hours. -------------------------------------------------------------------------------------------------------------------  Cardiac Enzymes  Recent Labs Lab 05/26/16 1535  TROPONINI <0.03   ------------------------------------------------------------------------------------------------------------------ No results found for:  BNP   ---------------------------------------------------------------------------------------------------------------  Urinalysis    Component Value Date/Time   COLORURINE YELLOW 05/26/2016 1702   APPEARANCEUR HAZY* 05/26/2016 1702   LABSPEC 1.025 05/26/2016 1702   PHURINE 5.5 05/26/2016 1702   GLUCOSEU NEGATIVE 05/26/2016 1702   HGBUR LARGE* 05/26/2016 1702   BILIRUBINUR NEGATIVE 05/26/2016 1702   KETONESUR TRACE* 05/26/2016 1702   PROTEINUR 100* 05/26/2016 1702   UROBILINOGEN 0.2 04/19/2012 1532   NITRITE POSITIVE* 05/26/2016 1702   LEUKOCYTESUR MODERATE* 05/26/2016 1702    ----------------------------------------------------------------------------------------------------------------   Imaging Results:    Dg Chest Port 1 View  05/26/2016  CLINICAL DATA:  Generalized weakness. EXAM: PORTABLE CHEST 1 VIEW COMPARISON:  None. FINDINGS: Low lung volumes. Heart is upper limits normal in size. No confluent opacities or effusions. No acute bony abnormality. IMPRESSION: Low lung volumes.  No active disease. Electronically Signed   By: Rolm Baptise M.D.   On: 05/26/2016 17:10   Ct Head Code Stroke W/o Cm  05/26/2016  CLINICAL DATA:  Parkinson's. Stuttering speech. Difficulty walking. EXAM: CT HEAD WITHOUT CONTRAST TECHNIQUE: Contiguous axial images were obtained from the base of the skull through the vertex without intravenous contrast. COMPARISON:  CT 04/19/2012 FINDINGS: Image quality degraded by motion Ill-defined hypodensity left posterior frontal lobe. This is consistent with artifact of indeterminate age. This was not present previously. Hypodensity in the left anterior frontal lobe also consistent with ischemia, of indeterminate age. There is chronic ischemia in the left basal ganglia. Generalized atrophy. Mild chronic ischemic change in the right frontal white matter. Negative for hemorrhage or mass.  No shift of the midline structures No skull lesion identified. IMPRESSION: Image  quality degraded by motion Left frontal hypodensities could represent acute or chronic ischemia. Correlate with symptoms and MRI. Progressive chronic ischemic changes in the left basal ganglia. These results were called by telephone at the time of interpretation on 05/26/2016 at 4:23 pm to Dr. Ezequiel Essex , who verbally acknowledged these results. Electronically Signed   By: Franchot Gallo M.D.   On: 05/26/2016 16:23    Radiological Exams on Admission: Dg Chest Port 1 View  05/26/2016  CLINICAL DATA:  Generalized weakness. EXAM: PORTABLE CHEST 1 VIEW COMPARISON:  None. FINDINGS: Low lung volumes. Heart is upper limits normal in size. No confluent opacities or effusions. No acute bony abnormality. IMPRESSION: Low lung volumes.  No active disease. Electronically Signed   By: Rolm Baptise M.D.  On: 05/26/2016 17:10   Ct Head Code Stroke W/o Cm  05/26/2016  CLINICAL DATA:  Parkinson's. Stuttering speech. Difficulty walking. EXAM: CT HEAD WITHOUT CONTRAST TECHNIQUE: Contiguous axial images were obtained from the base of the skull through the vertex without intravenous contrast. COMPARISON:  CT 04/19/2012 FINDINGS: Image quality degraded by motion Ill-defined hypodensity left posterior frontal lobe. This is consistent with artifact of indeterminate age. This was not present previously. Hypodensity in the left anterior frontal lobe also consistent with ischemia, of indeterminate age. There is chronic ischemia in the left basal ganglia. Generalized atrophy. Mild chronic ischemic change in the right frontal white matter. Negative for hemorrhage or mass.  No shift of the midline structures No skull lesion identified. IMPRESSION: Image quality degraded by motion Left frontal hypodensities could represent acute or chronic ischemia. Correlate with symptoms and MRI. Progressive chronic ischemic changes in the left basal ganglia. These results were called by telephone at the time of interpretation on 05/26/2016 at 4:23  pm to Dr. Ezequiel Essex , who verbally acknowledged these results. Electronically Signed   By: Franchot Gallo M.D.   On: 05/26/2016 16:23    DVT Prophylaxis scd  AM Labs Ordered, also please review Full Orders  Family Communication: Admission, patients condition and plan of care including tests being ordered have been discussed with the patient and daughter who indicate understanding and agree with the plan   Code Status - Full Code  Likely DC to  Home   Condition   fair  Tashan Kreitzer M.D on 05/26/2016 at 9:55 PM   Between 7am to 7pm - Pager - [phone]  After 7pm go to [email or web address] - password [email or web address]  [facility] - Office  [phone]  Dragon dictation system was used to create this note, attempts have been made to correct errors, however presence of uncorrected errors is not a reflection quality of care provided.

## 2016-05-26 NOTE — ED Notes (Signed)
Report given to C. Edwards Charity fundraiserN.

## 2016-05-27 ENCOUNTER — Observation Stay (HOSPITAL_COMMUNITY): Payer: Medicare Other

## 2016-05-27 DIAGNOSIS — R1319 Other dysphagia: Secondary | ICD-10-CM | POA: Diagnosis present

## 2016-05-27 DIAGNOSIS — A4150 Gram-negative sepsis, unspecified: Secondary | ICD-10-CM | POA: Diagnosis not present

## 2016-05-27 DIAGNOSIS — N390 Urinary tract infection, site not specified: Secondary | ICD-10-CM | POA: Diagnosis present

## 2016-05-27 DIAGNOSIS — R6520 Severe sepsis without septic shock: Secondary | ICD-10-CM | POA: Diagnosis not present

## 2016-05-27 DIAGNOSIS — D696 Thrombocytopenia, unspecified: Secondary | ICD-10-CM | POA: Diagnosis not present

## 2016-05-27 DIAGNOSIS — A419 Sepsis, unspecified organism: Secondary | ICD-10-CM | POA: Diagnosis not present

## 2016-05-27 DIAGNOSIS — Z883 Allergy status to other anti-infective agents status: Secondary | ICD-10-CM | POA: Diagnosis not present

## 2016-05-27 DIAGNOSIS — N179 Acute kidney failure, unspecified: Secondary | ICD-10-CM | POA: Diagnosis present

## 2016-05-27 DIAGNOSIS — B961 Klebsiella pneumoniae [K. pneumoniae] as the cause of diseases classified elsewhere: Secondary | ICD-10-CM | POA: Diagnosis present

## 2016-05-27 DIAGNOSIS — R7881 Bacteremia: Secondary | ICD-10-CM

## 2016-05-27 DIAGNOSIS — G9340 Encephalopathy, unspecified: Secondary | ICD-10-CM | POA: Diagnosis not present

## 2016-05-27 DIAGNOSIS — R4182 Altered mental status, unspecified: Secondary | ICD-10-CM | POA: Diagnosis present

## 2016-05-27 DIAGNOSIS — G9341 Metabolic encephalopathy: Secondary | ICD-10-CM | POA: Diagnosis present

## 2016-05-27 DIAGNOSIS — A4159 Other Gram-negative sepsis: Secondary | ICD-10-CM | POA: Diagnosis present

## 2016-05-27 DIAGNOSIS — G20 Parkinson's disease: Secondary | ICD-10-CM | POA: Diagnosis present

## 2016-05-27 DIAGNOSIS — Z888 Allergy status to other drugs, medicaments and biological substances status: Secondary | ICD-10-CM | POA: Diagnosis not present

## 2016-05-27 LAB — BLOOD CULTURE ID PANEL (REFLEXED)
Acinetobacter baumannii: NOT DETECTED
CANDIDA TROPICALIS: NOT DETECTED
CARBAPENEM RESISTANCE: NOT DETECTED
Candida albicans: NOT DETECTED
Candida glabrata: NOT DETECTED
Candida krusei: NOT DETECTED
Candida parapsilosis: NOT DETECTED
ENTEROCOCCUS SPECIES: NOT DETECTED
Enterobacter cloacae complex: NOT DETECTED
Enterobacteriaceae species: DETECTED — AB
Escherichia coli: NOT DETECTED
HAEMOPHILUS INFLUENZAE: NOT DETECTED
Klebsiella oxytoca: NOT DETECTED
Klebsiella pneumoniae: DETECTED — AB
LISTERIA MONOCYTOGENES: NOT DETECTED
METHICILLIN RESISTANCE: NOT DETECTED
Neisseria meningitidis: NOT DETECTED
PROTEUS SPECIES: NOT DETECTED
Pseudomonas aeruginosa: NOT DETECTED
SERRATIA MARCESCENS: NOT DETECTED
STAPHYLOCOCCUS AUREUS BCID: NOT DETECTED
STAPHYLOCOCCUS SPECIES: NOT DETECTED
STREPTOCOCCUS PYOGENES: NOT DETECTED
Streptococcus agalactiae: NOT DETECTED
Streptococcus pneumoniae: NOT DETECTED
Streptococcus species: NOT DETECTED
VANCOMYCIN RESISTANCE: NOT DETECTED

## 2016-05-27 LAB — BASIC METABOLIC PANEL
ANION GAP: 7 (ref 5–15)
BUN: 25 mg/dL — ABNORMAL HIGH (ref 6–20)
CHLORIDE: 110 mmol/L (ref 101–111)
CO2: 22 mmol/L (ref 22–32)
CREATININE: 1.05 mg/dL — AB (ref 0.44–1.00)
Calcium: 7.5 mg/dL — ABNORMAL LOW (ref 8.9–10.3)
GFR calc non Af Amer: 50 mL/min — ABNORMAL LOW (ref >60)
GFR, EST AFRICAN AMERICAN: 58 mL/min — AB (ref >60)
Glucose, Bld: 123 mg/dL — ABNORMAL HIGH (ref 65–99)
POTASSIUM: 4.2 mmol/L (ref 3.5–5.1)
SODIUM: 139 mmol/L (ref 135–145)

## 2016-05-27 LAB — CBC
HEMATOCRIT: 35.5 % — AB (ref 36.0–46.0)
HEMOGLOBIN: 11.8 g/dL — AB (ref 12.0–15.0)
MCH: 33.5 pg (ref 26.0–34.0)
MCHC: 33.2 g/dL (ref 30.0–36.0)
MCV: 100.9 fL — AB (ref 78.0–100.0)
PLATELETS: 61 10*3/uL — AB (ref 150–400)
RBC: 3.52 MIL/uL — AB (ref 3.87–5.11)
RDW: 13.5 % (ref 11.5–15.5)
WBC: 8.9 10*3/uL (ref 4.0–10.5)

## 2016-05-27 LAB — LACTIC ACID, PLASMA
LACTIC ACID, VENOUS: 3.1 mmol/L — AB (ref 0.5–2.0)
Lactic Acid, Venous: 2.6 mmol/L (ref 0.5–2.0)

## 2016-05-27 MED ORDER — DEXTROSE 5 % IV SOLN
1.0000 g | INTRAVENOUS | Status: DC
Start: 2016-05-27 — End: 2016-05-27
  Administered 2016-05-27: 1 g via INTRAVENOUS
  Filled 2016-05-27 (×2): qty 10

## 2016-05-27 MED ORDER — DEXTROSE-NACL 5-0.9 % IV SOLN
INTRAVENOUS | Status: DC
  Administered 2016-05-27 – 2016-06-01 (×8): via INTRAVENOUS

## 2016-05-27 MED ORDER — DEXTROSE 5 % IV SOLN
1.0000 g | Freq: Two times a day (BID) | INTRAVENOUS | Status: DC
  Administered 2016-05-27 – 2016-05-28 (×2): 1 g via INTRAVENOUS
  Filled 2016-05-27 (×8): qty 10

## 2016-05-27 MED ORDER — DEXTROSE 5 % IV SOLN
INTRAVENOUS | Status: AC
  Filled 2016-05-27: qty 10

## 2016-05-27 MED ORDER — STARCH (THICKENING) PO POWD
ORAL | Status: DC | PRN
  Filled 2016-05-27: qty 227

## 2016-05-27 NOTE — Evaluation (Signed)
Clinical/Bedside Swallow Evaluation Patient Details  Name: Carol Mckee MRN: [id number] Date of Birth: [birth year]  Today's Date: 05/27/2016 Time: SLP Start Time (ACUTE ONLY): 1210 SLP Stop Time (ACUTE ONLY): 1252 SLP Time Calculation (min) (ACUTE ONLY): 42 min  Past Medical History:  Past Medical History  Diagnosis Date  . Edema   . Other chronic pulmonary heart diseases   . Paralysis agitans (HCC)   . Stiffness of joint, not elsewhere classified,  shoulder region   . Abnormality of gait   . Fatigue   . Parkinson disease (HCC)   . Spinal stenosis   . Parkinson's disease (HCC) 10/04/2013   Past Surgical History:  Past Surgical History  Procedure Laterality Date  . Abdominal hysterectomy     HPI:  Carol Mckee is a 75 yo woman who   Assessment / Plan / Recommendation Clinical Impression  Pt presents with moderte oral phase and suspected pharyngeal phase dysphagia in setting of Parkinson's disease with acute illness. Pt has not received her medications since yesterday at 11 AM. Pt's sister present for evaluation and assisted with providing background information, however pt attempts to communicate all needs herself. Speech intelligibility is impaired and not at baseline per sister (however pt does have baseline dysarthria from PD). Oral cavity is very dry and dried secretions along lips, oral care completed. Dry mouth improved with ice chip presentations. Pt had difficulty with oral control when presented with thin water (better with SLP controlling bolus and sips) and when attempting to self feed, experienced strong coughing episode (sort of silent in nature due to difficulty generating cough from PD). Suspect negatively impacted by hand tremor, decreased lingual control, and premature spillage into oropharynx. Pt tolerated puree and NTL via cup and straw sips. Recommend initiating D1/puree with NTL (no thin liquids) and medications whole in puree with feeder assist. Prognosis for  upgrading diet is good as pt returns closer to baseline. OK for ice chips presented by RN for comfort between meals and after oral care. SLP to follow. Above to RN and sister.     Aspiration Risk  Moderate aspiration risk    Diet Recommendation Dysphagia 1 (Puree);Nectar-thick liquid;Ice chips PRN after oral care   Liquid Administration via: Cup;Straw Medication Administration: Whole meds with puree Supervision: Staff to assist with self feeding;Full supervision/cueing for compensatory strategies Compensations: Slow rate;Small sips/bites Postural Changes: Seated upright at 90 degrees;Remain upright for at least 30 minutes after po intake    Other  Recommendations Oral Care Recommendations: Oral care BID;Oral care prior to ice chip/H20;Staff/trained caregiver to provide oral care Other Recommendations: Order thickener from pharmacy;Prohibited food (jello, ice cream, thin soups);Clarify dietary restrictions   Follow up Recommendations  Skilled Nursing facility (pending)    Frequency and Duration min 2x/week  1 week       Prognosis Prognosis for Safe Diet Advancement: Fair Barriers to Reach Goals: Severity of deficits      Swallow Study   General Date of Onset: 05/26/16 HPI: Carol Mckee is a 75 yo woman who Type of Study: Bedside Swallow Evaluation Previous Swallow Assessment: None on record Diet Prior to this Study: NPO (PTA: regular/thin) Temperature Spikes Noted: No Respiratory Status: Room air History of Recent Intubation: No Behavior/Cognition: Alert;Cooperative;Pleasant mood Oral Cavity Assessment: Dry Oral Care Completed by SLP: Yes Oral Cavity - Dentition: Adequate natural dentition (has partials etc) Vision: Functional for self-feeding Self-Feeding Abilities: Needs assist Patient Positioning: Upright in bed (tremors interfere with self feeding at this time) Baseline Vocal Quality: Normal;Low  vocal intensity Volitional Cough: Weak Volitional Swallow: Able to elicit     Oral/Motor/Sensory Function Overall Oral Motor/Sensory Function: Moderate impairment Facial Strength: Reduced right;Reduced left Lingual ROM: Reduced right;Reduced left Lingual Symmetry: Within Functional Limits Lingual Strength: Reduced Lingual Sensation: Within Functional Limits Velum: Within Functional Limits Mandible: Within Functional Limits   Ice Chips Ice chips: Within functional limits Presentation: Spoon   Thin Liquid Thin Liquid: Impaired Presentation: Cup;Self Fed Oral Phase Impairments: Reduced lingual movement/coordination Pharyngeal  Phase Impairments: Suspected delayed Swallow;Cough - Immediate Other Comments:  (tolerated SLP controlled, cough when self presenting)    Nectar Thick Nectar Thick Liquid: Within functional limits Presentation: Cup;Self Fed;Straw   Honey Thick Honey Thick Liquid: Not tested   Puree Puree: Within functional limits Presentation: Spoon   Solid   Thank you,  Havery MorosDabney Porter, CCC-SLP [phone]    Solid: Not tested    Functional Assessment Tool Used:  (clinical judgment) Functional Limitations: Swallowing Swallow Current Status [phone](G8996): At least 40 percent but less than 60 percent impaired, limited or restricted Swallow Goal Status [phone](G8997): At least 20 percent but less than 40 percent impaired, limited or restricted   PORTER,DABNEY 05/27/2016,2:42 PM

## 2016-05-27 NOTE — Progress Notes (Signed)
PROGRESS NOTE    Carol Mckee  ZOX:[id number] DOB: August 21, 1941 DOA: 05/26/2016 PCP: Selinda Flavin, MD     Brief Narrative:  75 y/o woman admitted on 6/21 from home with c/o fatigue and confusion as well as dysuria. In ED found to have temp of 101. Diagnosed with sepsis due to UTI   Assessment & Plan:   Principal Problem:   Severe sepsis (HCC) Active Problems:   UTI (urinary tract infection) with Sepsis   Gram negative septicemia (HCC)   Parkinson's disease (HCC)   Encephalopathy, metabolic   Encephalopathy acute   Severe Sepsis due to UTI with GNR Bacteremia -RCID with klebsiella and enterobacter, -Rocephin dose has been increased pending sensitivities. -Still with elevated lactic acid, altho improving. -BP stable. -Still febrile.  Acute Encephalopathy -Due to sepsis phenomena. -DO NOT BELIEVE PATIENT HAS HAD A CVA. As such, will DC orders for MRI, dopplers, ECHO. Her encephalopathy is clearly explained by her severe septicemia.  Dysphagia -Due to h/o Parkinsons Disease, compounded by her acute encephalopathy. -Appreciate ST evaluation.   DVT prophylaxis: SQ heparin Code Status: Full code Family Communication: d/w husband and sister at bedside Disposition Plan: To be determined  Consultants:   None  Procedures:   None  Antimicrobials:   Rocephin (high dose)    Subjective: Still confused, not very coherent, mumbles nonsensically. I do not appreciate any focal deficits.  Objective: Filed Vitals:   05/27/16 0100 05/27/16 0654 05/27/16 0930 05/27/16 1407  BP: 159/65 126/78  147/77  Pulse: 125 80  97  Temp: 103.2 F (39.6 C) 101.1 F (38.4 C) 98.7 F (37.1 C) 100.8 F (38.2 C)  TempSrc: Rectal Rectal Rectal Rectal  Resp:  20  20  Height:      Weight:      SpO2: 94% 95%  95%    Intake/Output Summary (Last 24 hours) at 05/27/16 1632 Last data filed at 05/27/16 1611  Gross per 24 hour  Intake 248.75 ml  Output   1675 ml  Net -1426.25 ml    Filed Weights   05/26/16 1958  Weight: 97 kg (213 lb 13.5 oz)    Examination:  General exam: eyes open, when asked a questions, mumbles nonsensically Respiratory system: Clear to auscultation. Respiratory effort normal. Cardiovascular system:RRR. No murmurs, rubs, gallops. Gastrointestinal system: Abdomen is nondistended, soft and nontender. No organomegaly or masses felt. Normal bowel sounds heard. Central nervous system: Confused, not oriented Extremities: No C/C/E, +pedal pulses Skin: No rashes, lesions or ulcers Psychiatry: Unable to evaluate given current mental state.    Data Reviewed: I have personally reviewed following labs and imaging studies  CBC:  Recent Labs Lab 05/26/16 1535 05/26/16 1606 05/27/16 0207  WBC 9.8  --  8.9  NEUTROABS 8.8*  --   --   HGB 12.3 13.3 11.8*  HCT 37.2 39.0 35.5*  MCV 101.1*  --  100.9*  PLT 91*  --  61*   Basic Metabolic Panel:  Recent Labs Lab 05/26/16 1535 05/26/16 1606 05/27/16 0207  NA 135 139 139  K 3.7 4.0 4.2  CL 104 101 110  CO2 19*  --  22  GLUCOSE 172* 167* 123*  BUN 28* 29* 25*  CREATININE 1.57* 1.50* 1.05*  CALCIUM 8.3*  --  7.5*   GFR: Estimated Creatinine Clearance: 53.7 mL/min (by C-G formula based on Cr of 1.05). Liver Function Tests:  Recent Labs Lab 05/26/16 1535  AST 34  ALT <5*  ALKPHOS 47  BILITOT 0.8  PROT 6.1*  ALBUMIN 3.4*   No results for input(s): LIPASE, AMYLASE in the last 168 hours. No results for input(s): AMMONIA in the last 168 hours. Coagulation Profile:  Recent Labs Lab 05/26/16 1630  INR 1.45   Cardiac Enzymes:  Recent Labs Lab 05/26/16 1535 05/26/16 1630  CKTOTAL  --  46  TROPONINI <0.03  --    BNP (last 3 results) No results for input(s): PROBNP in the last 8760 hours. HbA1C: No results for input(s): HGBA1C in the last 72 hours. CBG: No results for input(s): GLUCAP in the last 168 hours. Lipid Profile: No results for input(s): CHOL, HDL, LDLCALC,  TRIG, CHOLHDL, LDLDIRECT in the last 72 hours. Thyroid Function Tests: No results for input(s): TSH, T4TOTAL, FREET4, T3FREE, THYROIDAB in the last 72 hours. Anemia Panel: No results for input(s): VITAMINB12, FOLATE, FERRITIN, TIBC, IRON, RETICCTPCT in the last 72 hours. Urine analysis:    Component Value Date/Time   COLORURINE YELLOW 05/26/2016 1702   APPEARANCEUR HAZY* 05/26/2016 1702   LABSPEC 1.025 05/26/2016 1702   PHURINE 5.5 05/26/2016 1702   GLUCOSEU NEGATIVE 05/26/2016 1702   HGBUR LARGE* 05/26/2016 1702   BILIRUBINUR NEGATIVE 05/26/2016 1702   KETONESUR TRACE* 05/26/2016 1702   PROTEINUR 100* 05/26/2016 1702   UROBILINOGEN 0.2 04/19/2012 1532   NITRITE POSITIVE* 05/26/2016 1702   LEUKOCYTESUR MODERATE* 05/26/2016 1702   Sepsis Labs: @LABRCNTIP (procalcitonin:4,lacticidven:4)  ) Recent Results (from the past 240 hour(s))  Blood culture (routine x 2)     Status: None (Preliminary result)   Collection Time: 05/26/16  5:50 PM  Result Value Ref Range Status   Specimen Description BLOOD LEFT ARM  Final   Special Requests BOTTLES DRAWN AEROBIC AND ANAEROBIC 6CC EACH  Final   Culture  Setup Time   Final    GRAM NEGATIVE RODS RECOVERED FROM BOTH BOTTLES Gram Stain Report Called to,Read Back By and Verified With: DICKERSON,M. AT 1059 ON 05/27/2016 BY BAUGHAM,M. Performed at [facility] CRITICAL RESULT CALLED TO, READ BACK BY AND VERIFIED WITH: L. SEAY, RPH AT 1429 ON 05/27/16 BY C. JESSUP, MLT. Performed at [facility]    Culture GRAM NEGATIVE RODS  Final   Report Status PENDING  Incomplete  Blood Culture ID Panel (Reflexed)     Status: Abnormal   Collection Time: 05/26/16  5:50 PM  Result Value Ref Range Status   Enterococcus species NOT DETECTED NOT DETECTED Final   Vancomycin resistance NOT DETECTED NOT DETECTED Final   Listeria monocytogenes NOT DETECTED NOT DETECTED Final   Staphylococcus species NOT DETECTED NOT DETECTED Final   Staphylococcus aureus  NOT DETECTED NOT DETECTED Final   Methicillin resistance NOT DETECTED NOT DETECTED Final   Streptococcus species NOT DETECTED NOT DETECTED Final   Streptococcus agalactiae NOT DETECTED NOT DETECTED Final   Streptococcus pneumoniae NOT DETECTED NOT DETECTED Final   Streptococcus pyogenes NOT DETECTED NOT DETECTED Final   Acinetobacter baumannii NOT DETECTED NOT DETECTED Final   Enterobacteriaceae species DETECTED (A) NOT DETECTED Final    Comment: CRITICAL RESULT CALLED TO, READ BACK BY AND VERIFIED WITH: L. SEAY, RPH AT 1420 ON 05/27/16 BY C. JESSUP, MLT.    Enterobacter cloacae complex NOT DETECTED NOT DETECTED Final   Escherichia coli NOT DETECTED NOT DETECTED Final   Klebsiella oxytoca NOT DETECTED NOT DETECTED Final   Klebsiella pneumoniae DETECTED (A) NOT DETECTED Final    Comment: CRITICAL RESULT CALLED TO, READ BACK BY AND VERIFIED WITH: L. SEAY, RPH AT 1420 ON 05/27/16 BY C. JESSUP, MLT.  Proteus species NOT DETECTED NOT DETECTED Final   Serratia marcescens NOT DETECTED NOT DETECTED Final   Carbapenem resistance NOT DETECTED NOT DETECTED Final   Haemophilus influenzae NOT DETECTED NOT DETECTED Final   Neisseria meningitidis NOT DETECTED NOT DETECTED Final   Pseudomonas aeruginosa NOT DETECTED NOT DETECTED Final   Candida albicans NOT DETECTED NOT DETECTED Final   Candida glabrata NOT DETECTED NOT DETECTED Final   Candida krusei NOT DETECTED NOT DETECTED Final   Candida parapsilosis NOT DETECTED NOT DETECTED Final   Candida tropicalis NOT DETECTED NOT DETECTED Final    Comment: Performed [facility]  Blood culture (routine x 2)     Status: None (Preliminary result)   Collection Time: 05/26/16  5:56 PM  Result Value Ref Range Status   Specimen Description BLOOD LEFT HAND  Final   Special Requests   Final    BOTTLES DRAWN AEROBIC AND ANAEROBIC AEB=6CC ANA=4CC   Culture  Setup Time   Final    GRAM NEGATIVE RODS RECOVERED FROM BOTH BOTTLES Gram Stain Report Called  to,Read Back By and Verified With: DICKERSON,M. AT 1059 ON 05/27/2016 BY BAUGHAM,M. Performed at [facility]    Culture GRAM NEGATIVE RODS  Final   Report Status PENDING  Incomplete         Radiology Studies: Dg Chest Port 1 View  05/26/2016  CLINICAL DATA:  Generalized weakness. EXAM: PORTABLE CHEST 1 VIEW COMPARISON:  None. FINDINGS: Low lung volumes. Heart is upper limits normal in size. No confluent opacities or effusions. No acute bony abnormality. IMPRESSION: Low lung volumes.  No active disease. Electronically Signed   By: Charlett NoseKevin  Dover M.D.   On: 05/26/2016 17:10   Ct Head Code Stroke W/o Cm  05/26/2016  CLINICAL DATA:  Parkinson's. Stuttering speech. Difficulty walking. EXAM: CT HEAD WITHOUT CONTRAST TECHNIQUE: Contiguous axial images were obtained from the base of the skull through the vertex without intravenous contrast. COMPARISON:  CT 04/19/2012 FINDINGS: Image quality degraded by motion Ill-defined hypodensity left posterior frontal lobe. This is consistent with artifact of indeterminate age. This was not present previously. Hypodensity in the left anterior frontal lobe also consistent with ischemia, of indeterminate age. There is chronic ischemia in the left basal ganglia. Generalized atrophy. Mild chronic ischemic change in the right frontal white matter. Negative for hemorrhage or mass.  No shift of the midline structures No skull lesion identified. IMPRESSION: Image quality degraded by motion Left frontal hypodensities could represent acute or chronic ischemia. Correlate with symptoms and MRI. Progressive chronic ischemic changes in the left basal ganglia. These results were called by telephone at the time of interpretation on 05/26/2016 at 4:23 pm to Dr. Glynn OctaveSTEPHEN RANCOUR , who verbally acknowledged these results. Electronically Signed   By: Marlan Palauharles  Clark M.D.   On: 05/26/2016 16:23        Scheduled Meds: . aspirin EC  81 mg Oral Daily  . baclofen  10 mg Oral Daily    . carbidopa-levodopa  1.5 tablet Oral 6 X Daily  . cefTRIAXone (ROCEPHIN)  IV  1 g Intravenous Q12H  . cholecalciferol  5,000 Units Oral Daily  . DULoxetine  30 mg Oral Daily  . heparin  5,000 Units Subcutaneous Q8H  . magnesium oxide  400 mg Oral Daily  . multivitamin with minerals  1 tablet Oral Daily  . polyethylene glycol  17 g Oral Daily  . senna  1 tablet Oral BID  . sodium chloride  500 mL Intravenous Once  .  sodium chloride flush  3 mL Intravenous Q12H  . sodium chloride flush  3 mL Intravenous Q12H  . vitamin E  400 Units Oral Daily   Continuous Infusions: . dextrose 5 % and 0.9% NaCl 75 mL/hr at 05/27/16 0611        Time spent: 30 minutes. Greater than 50% of this time was spent in direct contact with the patient coordinating care.     Chaya JanHERNANDEZ ACOSTA,ESTELA, MD [facility] Pager [phone]  If 7PM-7AM, please contact night-coverage [email or web address] Password [email or web address] 05/27/2016, 4:32 PM

## 2016-05-27 NOTE — Progress Notes (Signed)
PHARMACY - PHYSICIAN COMMUNICATION CRITICAL VALUE ALERT - BLOOD CULTURE IDENTIFICATION (BCID)  Results for orders placed or performed during the hospital encounter of 05/26/16  Blood Culture ID Panel (Reflexed) (Collected: 05/26/2016  5:50 PM)  Result Value Ref Range   Enterococcus species NOT DETECTED NOT DETECTED   Vancomycin resistance NOT DETECTED NOT DETECTED   Listeria monocytogenes NOT DETECTED NOT DETECTED   Staphylococcus species NOT DETECTED NOT DETECTED   Staphylococcus aureus NOT DETECTED NOT DETECTED   Methicillin resistance NOT DETECTED NOT DETECTED   Streptococcus species NOT DETECTED NOT DETECTED   Streptococcus agalactiae NOT DETECTED NOT DETECTED   Streptococcus pneumoniae NOT DETECTED NOT DETECTED   Streptococcus pyogenes NOT DETECTED NOT DETECTED   Acinetobacter baumannii NOT DETECTED NOT DETECTED   Enterobacteriaceae species DETECTED (A) NOT DETECTED   Enterobacter cloacae complex NOT DETECTED NOT DETECTED   Escherichia coli NOT DETECTED NOT DETECTED   Klebsiella oxytoca NOT DETECTED NOT DETECTED   Klebsiella pneumoniae DETECTED (A) NOT DETECTED   Proteus species NOT DETECTED NOT DETECTED   Serratia marcescens NOT DETECTED NOT DETECTED   Carbapenem resistance NOT DETECTED NOT DETECTED   Haemophilus influenzae NOT DETECTED NOT DETECTED   Neisseria meningitidis NOT DETECTED NOT DETECTED   Pseudomonas aeruginosa NOT DETECTED NOT DETECTED   Candida albicans NOT DETECTED NOT DETECTED   Candida glabrata NOT DETECTED NOT DETECTED   Candida krusei NOT DETECTED NOT DETECTED   Candida parapsilosis NOT DETECTED NOT DETECTED   Candida tropicalis NOT DETECTED NOT DETECTED    Name of physician (or Provider) Contacted: Dr Ardyth HarpsHernandez was contacted earlier this morning with result of gnr in blood cultures and rocephin was changed to 1 gm IV q12 hours.   Carol GanjaSeay, Carol Mckee 05/27/2016  2:39 PM

## 2016-05-27 NOTE — Progress Notes (Signed)
PT Cancellation Note  Patient Details Name: Carol DresserLeola Mckee MRN: [id number] DOB: 1941-10-22   Cancelled Treatment:    Reason Eval/Treat Not Completed: Patient not medically ready Upon entering the room, Dr Ardyth HarpsHernandez was present, and requests to hold PT evaluation until tomorrow due to continued altered mental status.  Will check back tomorrow.    Beth Sinia Antosh, PT, DPT X: [phone]

## 2016-05-27 NOTE — Progress Notes (Signed)
CRITICAL VALUE ALERT  Critical value received:  Aerobic/Aneorobic blood culture bottles positive for Gram Negative Rods  Date of notification:  05/27/16  Time of notification:  1059  Critical value read back:Yes.    Nurse who received alert:  Lyda JesterMaryann Dickerson, RN  MD notified (1st page):  Dr. Ardyth HarpsHernandez  Time of first page:  1110  MD notified (2nd page):  Time of second page:  Responding MD:  Dr. Ardyth HarpsHernandez  Time MD responded:  [phone]

## 2016-05-28 LAB — BASIC METABOLIC PANEL
ANION GAP: 8 (ref 5–15)
BUN: 23 mg/dL — ABNORMAL HIGH (ref 6–20)
CHLORIDE: 112 mmol/L — AB (ref 101–111)
CO2: 21 mmol/L — AB (ref 22–32)
Calcium: 7.9 mg/dL — ABNORMAL LOW (ref 8.9–10.3)
Creatinine, Ser: 0.85 mg/dL (ref 0.44–1.00)
GLUCOSE: 125 mg/dL — AB (ref 65–99)
POTASSIUM: 4.1 mmol/L (ref 3.5–5.1)
Sodium: 141 mmol/L (ref 135–145)

## 2016-05-28 LAB — CBC
HCT: 38.3 % (ref 36.0–46.0)
Hemoglobin: 12.4 g/dL (ref 12.0–15.0)
MCH: 32.7 pg (ref 26.0–34.0)
MCHC: 32.4 g/dL (ref 30.0–36.0)
MCV: 101.1 fL — ABNORMAL HIGH (ref 78.0–100.0)
PLATELETS: 38 10*3/uL — AB (ref 150–400)
RBC: 3.79 MIL/uL — ABNORMAL LOW (ref 3.87–5.11)
RDW: 13.7 % (ref 11.5–15.5)
WBC: 7 10*3/uL (ref 4.0–10.5)

## 2016-05-28 LAB — URINE CULTURE

## 2016-05-28 MED ORDER — DEXTROSE 5 % IV SOLN
2.0000 g | INTRAVENOUS | Status: DC
  Administered 2016-05-29 – 2016-06-02 (×5): 2 g via INTRAVENOUS
  Filled 2016-05-28 (×6): qty 2

## 2016-05-28 MED ORDER — IBUPROFEN 600 MG PO TABS
600.0000 mg | ORAL_TABLET | Freq: Once | ORAL | Status: AC
  Administered 2016-05-28: 600 mg via ORAL
  Filled 2016-05-28: qty 1

## 2016-05-28 MED ORDER — RESOURCE THICKENUP CLEAR PO POWD
ORAL | Status: DC | PRN
  Filled 2016-05-28: qty 125

## 2016-05-28 NOTE — Progress Notes (Signed)
Speech Language Pathology Treatment: Dysphagia  Patient Details Name: Carol DresserLeola Kiser MRN: [id number] DOB: 01/04/1941 Today's Date: 05/28/2016 Time: [id number] SLP Time Calculation (min) (ACUTE ONLY): 31 min  Assessment / Plan / Recommendation Clinical Impression  SLP followed up for upgraded PO trials. Nursing reports family noncompliance with previous ST recommendations of nectar thick liquids: family observed administering thin liquids despite education. Swallow function continues to be negatively impacted by advanced Parkinson's Disease. Pt with one overt aspiration incidence characterized by coughing spell and watery eyes following thin liquids by straw when prior solid residuals were still present in oral cavity. Pt with difficulty clearing solid oral residuals independent of liquid alternation strategy. Note significant pt difficulty generating a cough, which puts pt at further risk for compromised airway protection. Pt without overt signs or symptoms of aspiration with trials of thin liquids in isolation. Pt with prolonged mastication of solid PO and oral residuals secondary to generalized oral weakness and reduced strength and coordination. Given severity of PD, compromised respiratory strength, reduced mobility, and intermittent difficulty during PO trials this date, recommend continue nectar thick liquids with [employer] Water Protocol: (water in between meals following oral care). Recommend dysphagia 2 (chopped) diet with medicines whole in puree. ST to continue to monitor during acute visit.     HPI HPI: Carol DresserLeola Lukehart is a 75 yo woman who      SLP Plan  Continue with current plan of care     Recommendations  Diet recommendations: Dysphagia 2 (fine chop);Nectar-thick liquid;Other(comment) Bascom Levels(Frazier Water Protocol: water in between meals p oral care) Liquids provided via: Cup;Straw Medication Administration: Whole meds with puree Supervision: Full supervision/cueing for compensatory  strategies;Staff to assist with self feeding Compensations: Slow rate;Small sips/bites Postural Changes and/or Swallow Maneuvers: Seated upright 90 degrees;Upright 30-60 min after meal             Oral Care Recommendations: Oral care BID;Oral care prior to ice chip/H20;Staff/trained caregiver to provide oral care Follow up Recommendations: Skilled Nursing facility Plan: Continue with current plan of care     GO               Marcene Duoshelsea Sumney MA, CCC-SLP Acute Care Speech Language Pathologist    Kennieth RadSumney, Jochebed Bills E 05/28/2016, 3:44 PM

## 2016-05-28 NOTE — Care Management Important Message (Signed)
Important Message  Patient Details  Name: Carol DresserLeola Mckee MRN: [id number] Date of Birth: 08-11-1941   Medicare Important Message Given:  Yes    [name], [name], RN 05/28/2016, 9:16 AM

## 2016-05-28 NOTE — Care Management Note (Signed)
Case Management Note  Patient Details  Name: Carol Mckee MRN: [id number] Date of Birth: 1941/02/10  Subjective/Objective:                  Pt admitted with sepsis. Pt has Parkinsons. Pt is from home, lives with her husband who is her caregiver. Pt requires total care. Pt has all necessary DME PTA, including hospital bed, BSC, lifts, electric and manual wheelchairs and CPAP. Pt has PD aid who comes weekly to assist with bathing. Pt's daughter is at the bedside during assessment. Pt's husband has been working towards finding new PCP, closer to pt's home so they do not have to drive [place]. [facility], [facility]. Family anticipates she will need RN, PT, OT, SLP, aid. Alroy BailiffLinda Lothian, made aware of referral and will obtain pt info from chart.   Action/Plan: Will cont to follow for DC planning needs.   Expected Discharge Date:     05/31/2016             Expected Discharge Plan:  Home w Home Health Services (vs SNF)  In-House Referral:  NA  Discharge planning Services  CM Consult  Post Acute Care Choice:  Home Health Choice offered to:  Patient  DME Arranged:    DME Agency:     HH Arranged:  RN, PT, OT, Nurse's Aide, Speech Therapy HH Agency:  [facility]  Status of Service:  In process, will continue to follow  If discussed at Long Length of Stay Meetings, dates discussed:    Additional Comments:  [name], Gracia Saggese Demske, RN 05/28/2016, 12:29 PM

## 2016-05-28 NOTE — Progress Notes (Signed)
PROGRESS NOTE    Carol DresserLeola Mckee  AVW:[id number] DOB: [birth year] DOA: 05/26/2016 PCP: Selinda FlavinHOWARD, KEVIN, MD     Brief Narrative78:  75 y/o woman admitted on 6/21 from home with c/o fatigue and confusion as well as dysuria. In ED found to have temp of 101. Diagnosed with sepsis due to UTI   Assessment & Plan:   Principal Problem:   Severe sepsis (HCC) Active Problems:   UTI (urinary tract infection) with Sepsis   Gram negative septicemia (HCC)   Parkinson's disease (HCC)   Encephalopathy, metabolic   Encephalopathy acute   Acute encephalopathy   Severe Sepsis due to UTI with GNR Bacteremia -RCID with klebsiella and enterobacter, -Rocephin dose has been increased pending sensitivities. -Sepsis parameters are improving.  Acute Encephalopathy -Due to sepsis phenomena. -DO NOT BELIEVE PATIENT HAS HAD A CVA. As such, will DC orders for MRI, dopplers, ECHO. Her encephalopathy is clearly explained by her severe septicemia. -Encephalopathy is improved today, husband concurs.  Dysphagia -Due to h/o Parkinsons Disease, compounded by her acute encephalopathy. -Appreciate ST evaluation.   DVT prophylaxis: SQ heparin Code Status: Full code Family Communication: d/w husband at bedside Disposition Plan: To be determined  Consultants:   None  Procedures:   None  Antimicrobials:   Rocephin (high dose)    Subjective: Still confused, although improved, today makes eye contact and is able to ask simple questions although is still not oriented.  Objective: Filed Vitals:   05/28/16 0943 05/28/16 1145 05/28/16 1343 05/28/16 1534  BP:   124/65   Pulse:   86   Temp: 101.4 F (38.6 C) 100.4 F (38 C) 98.6 F (37 C) 98.3 F (36.8 C)  TempSrc: Rectal Rectal Rectal Rectal  Resp:   20   Height:      Weight:      SpO2:   94%     Intake/Output Summary (Last 24 hours) at 05/28/16 1718 Last data filed at 05/28/16 1500  Gross per 24 hour  Intake 3142.5 ml  Output   1450 ml    Net 1692.5 ml   Filed Weights   05/26/16 1958  Weight: 97 kg (213 lb 13.5 oz)    Examination:  General exam: eyes open, Oriented to person only Respiratory system: Clear to auscultation. Respiratory effort normal. Cardiovascular system:RRR. No murmurs, rubs, gallops. Gastrointestinal system: Abdomen is nondistended, soft and nontender. No organomegaly or masses felt. Normal bowel sounds heard. Central nervous system: Confused, not oriented Extremities: No C/C/E, +pedal pulses Skin: No rashes, lesions or ulcers Psychiatry: Unable to evaluate given current mental state.    Data Reviewed: I have personally reviewed following labs and imaging studies  CBC:  Recent Labs Lab 05/26/16 1535 05/26/16 1606 05/27/16 0207 05/28/16 0505  WBC 9.8  --  8.9 7.0  NEUTROABS 8.8*  --   --   --   HGB 12.3 13.3 11.8* 12.4  HCT 37.2 39.0 35.5* 38.3  MCV 101.1*  --  100.9* 101.1*  PLT 91*  --  61* 38*   Basic Metabolic Panel:  Recent Labs Lab 05/26/16 1535 05/26/16 1606 05/27/16 0207 05/28/16 0505  NA 135 139 139 141  K 3.7 4.0 4.2 4.1  CL 104 101 110 112*  CO2 19*  --  22 21*  GLUCOSE 172* 167* 123* 125*  BUN 28* 29* 25* 23*  CREATININE 1.57* 1.50* 1.05* 0.85  CALCIUM 8.3*  --  7.5* 7.9*   GFR: Estimated Creatinine Clearance: 66.3 mL/min (by C-G formula based  on Cr of 0.85). Liver Function Tests:  Recent Labs Lab 05/26/16 1535  AST 34  ALT <5*  ALKPHOS 47  BILITOT 0.8  PROT 6.1*  ALBUMIN 3.4*   No results for input(s): LIPASE, AMYLASE in the last 168 hours. No results for input(s): AMMONIA in the last 168 hours. Coagulation Profile:  Recent Labs Lab 05/26/16 1630  INR 1.45   Cardiac Enzymes:  Recent Labs Lab 05/26/16 1535 05/26/16 1630  CKTOTAL  --  46  TROPONINI <0.03  --    BNP (last 3 results) No results for input(s): PROBNP in the last 8760 hours. HbA1C: No results for input(s): HGBA1C in the last 72 hours. CBG: No results for input(s):  GLUCAP in the last 168 hours. Lipid Profile: No results for input(s): CHOL, HDL, LDLCALC, TRIG, CHOLHDL, LDLDIRECT in the last 72 hours. Thyroid Function Tests: No results for input(s): TSH, T4TOTAL, FREET4, T3FREE, THYROIDAB in the last 72 hours. Anemia Panel: No results for input(s): VITAMINB12, FOLATE, FERRITIN, TIBC, IRON, RETICCTPCT in the last 72 hours. Urine analysis:    Component Value Date/Time   COLORURINE YELLOW 05/26/2016 1702   APPEARANCEUR HAZY* 05/26/2016 1702   LABSPEC 1.025 05/26/2016 1702   PHURINE 5.5 05/26/2016 1702   GLUCOSEU NEGATIVE 05/26/2016 1702   HGBUR LARGE* 05/26/2016 1702   BILIRUBINUR NEGATIVE 05/26/2016 1702   KETONESUR TRACE* 05/26/2016 1702   PROTEINUR 100* 05/26/2016 1702   UROBILINOGEN 0.2 04/19/2012 1532   NITRITE POSITIVE* 05/26/2016 1702   LEUKOCYTESUR MODERATE* 05/26/2016 1702   Sepsis Labs: (procalcitonin:4,lacticidven:4)  ) Recent Results (from the past 240 hour(s))  Urine culture     Status: Abnormal   Collection Time: 05/26/16  5:02 PM  Result Value Ref Range Status   Specimen Description URINE, CLEAN CATCH  Final   Special Requests NONE  Final   Culture MULTIPLE SPECIES PRESENT, SUGGEST RECOLLECTION (A)  Final   Report Status 05/28/2016 FINAL  Final  Blood culture (routine x 2)     Status: Abnormal (Preliminary result)   Collection Time: 05/26/16  5:50 PM  Result Value Ref Range Status   Specimen Description BLOOD LEFT ARM  Final   Special Requests BOTTLES DRAWN AEROBIC AND ANAEROBIC 6CC EACH  Final   Culture  Setup Time   Final    GRAM NEGATIVE RODS RECOVERED FROM BOTH BOTTLES Gram Stain Report Called to,Read Back By and Verified With: DICKERSON,M. AT 1059 ON 05/27/2016 BY BAUGHAM,M. Performed at [facility] CRITICAL RESULT CALLED TO, READ BACK BY AND VERIFIED WITH: L. SEAY, RPH AT 1429 ON 05/27/16 BY C. JESSUP, MLT.    Culture (A)  Final    KLEBSIELLA PNEUMONIAE SUSCEPTIBILITIES TO FOLLOW Performed at  [facility]    Report Status PENDING  Incomplete  Blood Culture ID Panel (Reflexed)     Status: Abnormal   Collection Time: 05/26/16  5:50 PM  Result Value Ref Range Status   Enterococcus species NOT DETECTED NOT DETECTED Final   Vancomycin resistance NOT DETECTED NOT DETECTED Final   Listeria monocytogenes NOT DETECTED NOT DETECTED Final   Staphylococcus species NOT DETECTED NOT DETECTED Final   Staphylococcus aureus NOT DETECTED NOT DETECTED Final   Methicillin resistance NOT DETECTED NOT DETECTED Final   Streptococcus species NOT DETECTED NOT DETECTED Final   Streptococcus agalactiae NOT DETECTED NOT DETECTED Final   Streptococcus pneumoniae NOT DETECTED NOT DETECTED Final   Streptococcus pyogenes NOT DETECTED NOT DETECTED Final   Acinetobacter baumannii NOT DETECTED NOT DETECTED Final   Enterobacteriaceae  species DETECTED (A) NOT DETECTED Final    Comment: CRITICAL RESULT CALLED TO, READ BACK BY AND VERIFIED WITH: L. SEAY, RPH AT 1420 ON 05/27/16 BY C. JESSUP, MLT.    Enterobacter cloacae complex NOT DETECTED NOT DETECTED Final   Escherichia coli NOT DETECTED NOT DETECTED Final   Klebsiella oxytoca NOT DETECTED NOT DETECTED Final   Klebsiella pneumoniae DETECTED (A) NOT DETECTED Final    Comment: CRITICAL RESULT CALLED TO, READ BACK BY AND VERIFIED WITH: L. SEAY, RPH AT 1420 ON 05/27/16 BY C. JESSUP, MLT.    Proteus species NOT DETECTED NOT DETECTED Final   Serratia marcescens NOT DETECTED NOT DETECTED Final   Carbapenem resistance NOT DETECTED NOT DETECTED Final   Haemophilus influenzae NOT DETECTED NOT DETECTED Final   Neisseria meningitidis NOT DETECTED NOT DETECTED Final   Pseudomonas aeruginosa NOT DETECTED NOT DETECTED Final   Candida albicans NOT DETECTED NOT DETECTED Final   Candida glabrata NOT DETECTED NOT DETECTED Final   Candida krusei NOT DETECTED NOT DETECTED Final   Candida parapsilosis NOT DETECTED NOT DETECTED Final   Candida tropicalis NOT DETECTED  NOT DETECTED Final    Comment: [facility]  Blood culture (routine x 2)     Status: None (Preliminary result)   Collection Time: 05/26/16  5:56 PM  Result Value Ref Range Status   Specimen Description BLOOD LEFT HAND  Final   Special Requests   Final    BOTTLES DRAWN AEROBIC AND ANAEROBIC AEB=6CC ANA=4CC   Culture  Setup Time   Final    GRAM NEGATIVE RODS RECOVERED FROM BOTH BOTTLES Gram Stain Report Called to,Read Back By and Verified With: DICKERSON,M. AT 1059 ON 05/27/2016 BY BAUGHAM,M. Performed at [facility]    Culture   Final    GRAM NEGATIVE RODS IDENTIFICATION TO FOLLOW Performed [facility]    Report Status PENDING  Incomplete         Radiology Studies: No results found.      Scheduled Meds: . aspirin EC  81 mg Oral Daily  . baclofen  10 mg Oral Daily  . carbidopa-levodopa  1.5 tablet Oral 6 X Daily  . [START ON 05/29/2016] cefTRIAXone (ROCEPHIN)  IV  2 g Intravenous Q24H  . cholecalciferol  5,000 Units Oral Daily  . DULoxetine  30 mg Oral Daily  . heparin  5,000 Units Subcutaneous Q8H  . magnesium oxide  400 mg Oral Daily  . multivitamin with minerals  1 tablet Oral Daily  . polyethylene glycol  17 g Oral Daily  . senna  1 tablet Oral BID  . sodium chloride  500 mL Intravenous Once  . sodium chloride flush  3 mL Intravenous Q12H  . sodium chloride flush  3 mL Intravenous Q12H  . vitamin E  400 Units Oral Daily   Continuous Infusions: . dextrose 5 % and 0.9% NaCl 75 mL/hr at 05/28/16 0941     LOS: 1 day    Time spent: 30 minutes. Greater than 50% of this time was spent in direct contact with the patient coordinating care.     Chaya JanHERNANDEZ ACOSTA,ESTELA, MD [facility] Pager [phone]  If 7PM-7AM, please contact night-coverage [email or web address] Password [email or web address] 05/28/2016, 5:18 PM

## 2016-05-28 NOTE — Evaluation (Signed)
Physical Therapy Evaluation Patient Details Name: Carol DresserLeola Mckee MRN: [id number] DOB: 1940-12-24 Today's Date: 05/28/2016   History of Present Illness  75 yo F admitted with AMS and speech problems. Dx: Severe sepsis from urinary source, metabolic encephalopathy. Head CT shows L frontal hypodensities which could represent acute or chronic ischemia. Awaiting MRI. PMH: Parkinson's, paralysis agitans, stiff shoulder, gait abnormality, spinal stenosis.  Clinical Impression  Pt received in bed, husband present, and pt is agreeable to PT evaluation.  Pt normally uses a rollator for household ambulation, and a w/c for mobility in the community.  She also requires assistance for dressing and bathing, which her husband provides.  Pt required Max A for supine<>sit, and Min A for sit<>stand, as well as Min A to take a few steps along the edge of the bed.  Discussed d/c disposition with both pt and husband, and they both want to go home.  Husband states he is able to provide 24/7 supervision/assistance.  Therefore, recommend HHPT.     Follow Up Recommendations Home health PT;Supervision/Assistance - 24 hour    Equipment Recommendations  None recommended by PT    Recommendations for Other Services       Precautions / Restrictions Precautions Precautions: Fall Precaution Comments: Pt reports she had a fall last december when she was in the bathroom.  Restrictions Weight Bearing Restrictions: No      Mobility  Bed Mobility Overal bed mobility: Needs Assistance Bed Mobility: Supine to Sit;Sit to Supine     Supine to sit: Max assist Sit to supine: Max assist      Transfers Overall transfer level: Needs assistance Equipment used: Rolling walker (2 wheeled) Transfers: Sit to/from Stand Sit to Stand: Min assist;+2 safety/equipment         General transfer comment: Husband present and participating in hands on education with transfers.   Ambulation/Gait Ambulation/Gait assistance: Min  assist Ambulation Distance (Feet): 3 Feet Assistive device: Rolling walker (2 wheeled) Gait Pattern/deviations: Step-to pattern;Trunk flexed     General Gait Details: Pt requires assistance for weight shifting to the right intermittently - to help with L foot advancement.    Stairs            Wheelchair Mobility    Modified Rankin (Stroke Patients Only)       Balance Overall balance assessment: Needs assistance Sitting-balance support: Bilateral upper extremity supported;Feet supported Sitting balance-Leahy Scale: Fair     Standing balance support: Bilateral upper extremity supported Standing balance-Leahy Scale: Fair                               Pertinent Vitals/Pain Pain Assessment: 0-10 Pain Location: L knee - but not able to quantify Pain Descriptors / Indicators: Aching Pain Intervention(s): Limited activity within patient's tolerance;Monitored during session    Home Living   Living Arrangements: Spouse/significant other (Dtr lives next door) Available Help at Discharge: Friend(s);Family (Carol Mckee - friend, niece) Type of Home: House       Home Layout: One level Home Equipment: Bedside commode;Walker - 2 wheels;Walker - 4 wheels;Cane - single point;Wheelchair - manual;Shower seat;Hospital bed;Other (comment) (Lift chair. )      Prior Function Level of Independence: Needs assistance   Gait / Transfers Assistance Needed: Pt states that she uses a Rollator at home, and uses the w/c in the community  ADL's / Homemaking Assistance Needed: Husband assists with dressing, and bathing - and Carol PoundDeborah used to help, but is  unable to at this point.  Pt states she uses a BSC at night. Husband assists her OOB.        Hand Dominance   Dominant Hand: Right (uses L hand to eat at times)    Extremity/Trunk Assessment   Upper Extremity Assessment: Generalized weakness;RUE deficits/detail RUE Deficits / Details: R UE AROM with limited shoulder flexion to  ~30* - chronic deficit.           Lower Extremity Assessment: Generalized weakness      Cervical / Trunk Assessment: Kyphotic  Communication      Cognition Arousal/Alertness: Awake/alert Behavior During Therapy: WFL for tasks assessed/performed Overall Cognitive Status: Within Functional Limits for tasks assessed                      General Comments      Exercises        Assessment/Plan    PT Assessment Patient needs continued PT services  PT Diagnosis Difficulty walking;Abnormality of gait;Generalized weakness   PT Problem List Decreased strength;Decreased range of motion;Decreased activity tolerance;Decreased balance;Decreased mobility;Decreased coordination;Decreased cognition;Decreased knowledge of use of DME;Decreased safety awareness;Decreased knowledge of precautions;Obesity  PT Treatment Interventions DME instruction;Gait training;Stair training;Functional mobility training;Therapeutic activities;Therapeutic exercise;Balance training;Neuromuscular re-education;Cognitive remediation;Patient/family education;Wheelchair mobility training   PT Goals (Current goals can be found in the Care Plan section) Acute Rehab PT Goals Patient Stated Goal: Pt wants to go home. PT Goal Formulation: With patient/family Time For Goal Achievement: 06/04/16 Potential to Achieve Goals: Good    Frequency Min 5X/week   Barriers to discharge        Co-evaluation               End of Session Equipment Utilized During Treatment: Gait belt Activity Tolerance: Patient tolerated treatment well;No increased pain Patient left: in bed;with family/visitor present;with call bell/phone within reach      Functional Assessment Tool Used: [employer] AM-PAC "6-clicks"  Functional Limitation: Mobility: Walking and moving around Mobility: Walking and Moving Around Current Status [phone](G8978): At least 60 percent but less than 80 percent impaired, limited or restricted Mobility:  Walking and Moving Around Goal Status [phone](G8979): At least 40 percent but less than 60 percent impaired, limited or restricted    Time: 1131-1206 PT Time Calculation (min) (ACUTE ONLY): 35 min   Charges:   PT Evaluation $PT Eval Moderate Complexity: 1 Procedure PT Treatments $Therapeutic Activity: 8-22 mins   PT G Codes:   PT G-Codes **NOT FOR INPATIENT CLASS** Functional Assessment Tool Used: [employer] AM-PAC "6-clicks"  Functional Limitation: Mobility: Walking and moving around Mobility: Walking and Moving Around Current Status [phone](G8978): At least 60 percent but less than 80 percent impaired, limited or restricted Mobility: Walking and Moving Around Goal Status [phone](G8979): At least 40 percent but less than 60 percent impaired, limited or restricted   Beth Galit Urich, PT, DPT X: [phone]

## 2016-05-29 LAB — CULTURE, BLOOD (ROUTINE X 2)

## 2016-05-29 MED ORDER — NAPHAZOLINE-GLYCERIN 0.012-0.2 % OP SOLN
1.0000 [drp] | Freq: Four times a day (QID) | OPHTHALMIC | Status: DC | PRN
  Administered 2016-05-29: 1 [drp] via OPHTHALMIC
  Filled 2016-05-29: qty 15

## 2016-05-29 MED ORDER — NYSTATIN 100000 UNIT/GM EX POWD
Freq: Two times a day (BID) | CUTANEOUS | Status: DC
  Administered 2016-05-29 – 2016-06-02 (×8): via TOPICAL
  Filled 2016-05-29: qty 15

## 2016-05-29 MED ORDER — CARBIDOPA-LEVODOPA 25-100 MG PO TABS
ORAL_TABLET | ORAL | Status: AC
  Filled 2016-05-29: qty 2

## 2016-05-29 NOTE — Clinical Documentation Improvement (Signed)
Hospitalist  Abnormal Lab/Test Results:  Component     Latest Ref Rng 05/26/2016 05/26/2016 05/27/2016 05/28/2016         3:35 PM  4:06 PM    BUN     6 - 20 mg/dL 28 (H) 29 (H) 25 (H) 23 (H)  Creatinine     0.44 - 1.00 mg/dL 1.57 (H) 1.50 (H) 1.05 (H) 0.85        EGFR (Non-African Amer.)     >60 mL/min 31 (L)  50 (L) >60   Possible Clinical Conditions associated with below indicators  Acute Kidney Injury  Acute renal failure  Other Condition  Cannot Clinically Determine   Supporting Information: 6/21 Pharmacy note: Plan:  Vancomycin 1500 mg IV loading dose, then 1250 mg IV every 24 hours  Zosyn 3.375 GM IV every 8 hours  Vancomycin trough at steady state  Monitor renal function   Treatment Provided: IV fluids at [place] urinary output Monitoring BMP Mon. Wed. & Fri.   Please exercise your independent, professional judgment when responding. A specific answer is not anticipated or expected.   Thank You,  [place] [phone]

## 2016-05-29 NOTE — Progress Notes (Addendum)
Pt family called out.  Pt had become very anxious, wheezing with increased respirations and temp 101.1.  Respiratory therapy called for PRN treatment.  PRN ativan and tylenol given.  Cooling blanket on.  Notified Dr Ardyth HarpsHernandez via text page.  Will continue to monitor.

## 2016-05-29 NOTE — Progress Notes (Signed)
PROGRESS NOTE    Carol Mckee  ZOX:[id number] DOB: 1941/11/26 DOA: 05/26/2016 PCP: Selinda Flavin, MD     Brief Narrative:  75 y/o woman admitted on 6/21 from home with c/o fatigue and confusion as well as dysuria. In ED found to have temp of 101. Diagnosed with sepsis due to UTI   Assessment & Plan:   Principal Problem:   Severe sepsis (HCC) Active Problems:   UTI (urinary tract infection) with Sepsis   Gram negative septicemia (HCC)   Parkinson's disease (HCC)   Encephalopathy, metabolic   Encephalopathy acute   Acute encephalopathy   Severe Sepsis due to UTI with Klebsiella Bacteremia -Blood cultures with Klebsiella sensitive to Rocephin, continue Rocephin. -Continues to spike temperatures despite adequate antibiotic coverage. If continues to be febrile overnight, would suggest ID consultation.  Acute Encephalopathy -Due to sepsis phenomena. -DO NOT BELIEVE PATIENT HAS HAD A CVA. As such, will DC orders for MRI, dopplers, ECHO. Her encephalopathy is clearly explained by her severe septicemia. -Encephalopathy is improved today.  Dysphagia -Due to h/o Parkinsons Disease, compounded by her acute encephalopathy. -Appreciate ST evaluation.   DVT prophylaxis: SQ heparin Code Status: Full code Family Communication: d/w daughter at bedside Disposition Plan: To be determined  Consultants:   None  Procedures:   None  Antimicrobials:   Rocephin (high dose)    Subjective: Still confused, although improved, today makes eye contact and is able to ask simple questions.  Objective: Filed Vitals:   05/29/16 1348 05/29/16 1351 05/29/16 1402 05/29/16 1515  BP:    129/46  Pulse: 92   98  Temp: 100.8 F (38.2 C)  101.1 F (38.4 C) 100 F (37.8 C)  TempSrc: Rectal  Rectal Rectal  Resp:    20  Height:      Weight:      SpO2: 97% 97%  98%    Intake/Output Summary (Last 24 hours) at 05/29/16 1600 Last data filed at 05/29/16 1119  Gross per 24 hour  Intake  1311.25 ml  Output    550 ml  Net 761.25 ml   Filed Weights   05/26/16 1958  Weight: 97 kg (213 lb 13.5 oz)    Examination:  General exam: eyes open, Oriented to person only Respiratory system: Clear to auscultation. Respiratory effort normal. Cardiovascular system:RRR. No murmurs, rubs, gallops. Gastrointestinal system: Abdomen is nondistended, soft and nontender. No organomegaly or masses felt. Normal bowel sounds heard. Central nervous system: Confused, not oriented Extremities: No C/C/E, +pedal pulses Skin: No rashes, lesions or ulcers Psychiatry: Unable to evaluate given current mental state.    Data Reviewed: I have personally reviewed following labs and imaging studies  CBC:  Recent Labs Lab 05/26/16 1535 05/26/16 1606 05/27/16 0207 05/28/16 0505  WBC 9.8  --  8.9 7.0  NEUTROABS 8.8*  --   --   --   HGB 12.3 13.3 11.8* 12.4  HCT 37.2 39.0 35.5* 38.3  MCV 101.1*  --  100.9* 101.1*  PLT 91*  --  61* 38*   Basic Metabolic Panel:  Recent Labs Lab 05/26/16 1535 05/26/16 1606 05/27/16 0207 05/28/16 0505  NA 135 139 139 141  K 3.7 4.0 4.2 4.1  CL 104 101 110 112*  CO2 19*  --  22 21*  GLUCOSE 172* 167* 123* 125*  BUN 28* 29* 25* 23*  CREATININE 1.57* 1.50* 1.05* 0.85  CALCIUM 8.3*  --  7.5* 7.9*   GFR: Estimated Creatinine Clearance: 66.3 mL/min (by C-G formula  based on Cr of 0.85). Liver Function Tests:  Recent Labs Lab 05/26/16 1535  AST 34  ALT <5*  ALKPHOS 47  BILITOT 0.8  PROT 6.1*  ALBUMIN 3.4*   No results for input(s): LIPASE, AMYLASE in the last 168 hours. No results for input(s): AMMONIA in the last 168 hours. Coagulation Profile:  Recent Labs Lab 05/26/16 1630  INR 1.45   Cardiac Enzymes:  Recent Labs Lab 05/26/16 1535 05/26/16 1630  CKTOTAL  --  46  TROPONINI <0.03  --    BNP (last 3 results) No results for input(s): PROBNP in the last 8760 hours. HbA1C: No results for input(s): HGBA1C in the last 72  hours. CBG: No results for input(s): GLUCAP in the last 168 hours. Lipid Profile: No results for input(s): CHOL, HDL, LDLCALC, TRIG, CHOLHDL, LDLDIRECT in the last 72 hours. Thyroid Function Tests: No results for input(s): TSH, T4TOTAL, FREET4, T3FREE, THYROIDAB in the last 72 hours. Anemia Panel: No results for input(s): VITAMINB12, FOLATE, FERRITIN, TIBC, IRON, RETICCTPCT in the last 72 hours. Urine analysis:    Component Value Date/Time   COLORURINE YELLOW 05/26/2016 1702   APPEARANCEUR HAZY* 05/26/2016 1702   LABSPEC 1.025 05/26/2016 1702   PHURINE 5.5 05/26/2016 1702   GLUCOSEU NEGATIVE 05/26/2016 1702   HGBUR LARGE* 05/26/2016 1702   BILIRUBINUR NEGATIVE 05/26/2016 1702   KETONESUR TRACE* 05/26/2016 1702   PROTEINUR 100* 05/26/2016 1702   UROBILINOGEN 0.2 04/19/2012 1532   NITRITE POSITIVE* 05/26/2016 1702   LEUKOCYTESUR MODERATE* 05/26/2016 1702   Sepsis Labs: @LABRCNTIP (procalcitonin:4,lacticidven:4)  ) Recent Results (from the past 240 hour(s))  Urine culture     Status: Abnormal   Collection Time: 05/26/16  5:02 PM  Result Value Ref Range Status   Specimen Description URINE, CLEAN CATCH  Final   Special Requests NONE  Final   Culture MULTIPLE SPECIES PRESENT, SUGGEST RECOLLECTION (A)  Final   Report Status 05/28/2016 FINAL  Final  Blood culture (routine x 2)     Status: Abnormal   Collection Time: 05/26/16  5:50 PM  Result Value Ref Range Status   Specimen Description BLOOD LEFT ARM  Final   Special Requests BOTTLES DRAWN AEROBIC AND ANAEROBIC 6CC EACH  Final   Culture  Setup Time   Final    GRAM NEGATIVE RODS RECOVERED FROM BOTH BOTTLES Gram Stain Report Called to,Read Back By and Verified With: DICKERSON,M. AT 1059 ON 05/27/2016 BY BAUGHAM,M. Performed at [facility] CRITICAL RESULT CALLED TO, READ BACK BY AND VERIFIED WITH: L. SEAY, RPH AT 1429 ON 05/27/16 BY C. JESSUP, MLT. [facility]    Culture KLEBSIELLA PNEUMONIAE (A)   Final   Report Status 05/29/2016 FINAL  Final   Organism ID, Bacteria KLEBSIELLA PNEUMONIAE  Final      Susceptibility   Klebsiella pneumoniae - MIC*    AMPICILLIN >=32 RESISTANT Resistant     CEFAZOLIN <=4 SENSITIVE Sensitive     CEFEPIME <=1 SENSITIVE Sensitive     CEFTAZIDIME <=1 SENSITIVE Sensitive     CEFTRIAXONE <=1 SENSITIVE Sensitive     CIPROFLOXACIN <=0.25 SENSITIVE Sensitive     GENTAMICIN <=1 SENSITIVE Sensitive     IMIPENEM <=0.25 SENSITIVE Sensitive     TRIMETH/SULFA <=20 SENSITIVE Sensitive     AMPICILLIN/SULBACTAM >=32 RESISTANT Resistant     PIP/TAZO 32 INTERMEDIATE Intermediate     * KLEBSIELLA PNEUMONIAE  Blood Culture ID Panel (Reflexed)     Status: Abnormal   Collection Time: 05/26/16  5:50 PM  Result Value Ref Range Status   Enterococcus species NOT DETECTED NOT DETECTED Final   Vancomycin resistance NOT DETECTED NOT DETECTED Final   Listeria monocytogenes NOT DETECTED NOT DETECTED Final   Staphylococcus species NOT DETECTED NOT DETECTED Final   Staphylococcus aureus NOT DETECTED NOT DETECTED Final   Methicillin resistance NOT DETECTED NOT DETECTED Final   Streptococcus species NOT DETECTED NOT DETECTED Final   Streptococcus agalactiae NOT DETECTED NOT DETECTED Final   Streptococcus pneumoniae NOT DETECTED NOT DETECTED Final   Streptococcus pyogenes NOT DETECTED NOT DETECTED Final   Acinetobacter baumannii NOT DETECTED NOT DETECTED Final   Enterobacteriaceae species DETECTED (A) NOT DETECTED Final    Comment: CRITICAL RESULT CALLED TO, READ BACK BY AND VERIFIED WITH: L. SEAY, RPH AT 1420 ON 05/27/16 BY C. JESSUP, MLT.    Enterobacter cloacae complex NOT DETECTED NOT DETECTED Final   Escherichia coli NOT DETECTED NOT DETECTED Final   Klebsiella oxytoca NOT DETECTED NOT DETECTED Final   Klebsiella pneumoniae DETECTED (A) NOT DETECTED Final    Comment: CRITICAL RESULT CALLED TO, READ BACK BY AND VERIFIED WITH: L. SEAY, RPH AT 1420 ON 05/27/16 BY C. JESSUP,  MLT.    Proteus species NOT DETECTED NOT DETECTED Final   Serratia marcescens NOT DETECTED NOT DETECTED Final   Carbapenem resistance NOT DETECTED NOT DETECTED Final   Haemophilus influenzae NOT DETECTED NOT DETECTED Final   Neisseria meningitidis NOT DETECTED NOT DETECTED Final   Pseudomonas aeruginosa NOT DETECTED NOT DETECTED Final   Candida albicans NOT DETECTED NOT DETECTED Final   Candida glabrata NOT DETECTED NOT DETECTED Final   Candida krusei NOT DETECTED NOT DETECTED Final   Candida parapsilosis NOT DETECTED NOT DETECTED Final   Candida tropicalis NOT DETECTED NOT DETECTED Final    Comment: Performed at [facility]  Blood culture (routine x 2)     Status: Abnormal   Collection Time: 05/26/16  5:56 PM  Result Value Ref Range Status   Specimen Description BLOOD LEFT HAND  Final   Special Requests   Final    BOTTLES DRAWN AEROBIC AND ANAEROBIC AEB=6CC ANA=4CC   Culture  Setup Time   Final    GRAM NEGATIVE RODS RECOVERED FROM BOTH BOTTLES Gram Stain Report Called to,Read Back By and Verified With: DICKERSON,M. AT 1059 ON 05/27/2016 BY BAUGHAM,M. Performed at [facility]    Culture (A)  Final    KLEBSIELLA PNEUMONIAE SUSCEPTIBILITIES PERFORMED ON PREVIOUS CULTURE WITHIN THE LAST 5 DAYS. Performed at [facility]    Report Status 05/29/2016 FINAL  Final         Radiology Studies: No results found.      Scheduled Meds: . aspirin EC  81 mg Oral Daily  . baclofen  10 mg Oral Daily  . carbidopa-levodopa  1.5 tablet Oral 6 X Daily  . cefTRIAXone (ROCEPHIN)  IV  2 g Intravenous Q24H  . cholecalciferol  5,000 Units Oral Daily  . DULoxetine  30 mg Oral Daily  . heparin  5,000 Units Subcutaneous Q8H  . magnesium oxide  400 mg Oral Daily  . multivitamin with minerals  1 tablet Oral Daily  . nystatin   Topical BID  . polyethylene glycol  17 g Oral Daily  . senna  1 tablet Oral BID  . sodium chloride  500 mL Intravenous Once  . sodium chloride  flush  3 mL Intravenous Q12H  . sodium chloride flush  3 mL Intravenous Q12H  . vitamin E  400  Units Oral Daily   Continuous Infusions: . dextrose 5 % and 0.9% NaCl 75 mL/hr at 05/29/16 1355     LOS: 2 days    Time spent: 30 minutes. Greater than 50% of this time was spent in direct contact with the patient coordinating care.     Chaya JanHERNANDEZ ACOSTA,ESTELA, MD [facility] Pager [phone]  If 7PM-7AM, please contact night-coverage [email or web address] Password [email or web address] 05/29/2016, 4:00 PM

## 2016-05-30 LAB — BASIC METABOLIC PANEL
ANION GAP: 8 (ref 5–15)
BUN: 20 mg/dL (ref 6–20)
CALCIUM: 8.2 mg/dL — AB (ref 8.9–10.3)
CHLORIDE: 108 mmol/L (ref 101–111)
CO2: 24 mmol/L (ref 22–32)
Creatinine, Ser: 0.73 mg/dL (ref 0.44–1.00)
GFR calc Af Amer: >60 mL/min (ref >60)
GFR calc non Af Amer: >60 mL/min (ref >60)
GLUCOSE: 112 mg/dL — AB (ref 65–99)
POTASSIUM: 3.9 mmol/L (ref 3.5–5.1)
Sodium: 140 mmol/L (ref 135–145)

## 2016-05-30 LAB — CBC
HEMATOCRIT: 39.7 % (ref 36.0–46.0)
HEMOGLOBIN: 13.3 g/dL (ref 12.0–15.0)
MCH: 33 pg (ref 26.0–34.0)
MCHC: 33.5 g/dL (ref 30.0–36.0)
MCV: 98.5 fL (ref 78.0–100.0)
Platelets: 40 10*3/uL — ABNORMAL LOW (ref 150–400)
RBC: 4.03 MIL/uL (ref 3.87–5.11)
RDW: 13.3 % (ref 11.5–15.5)
WBC: 7.3 10*3/uL (ref 4.0–10.5)

## 2016-05-30 MED ORDER — CARBIDOPA-LEVODOPA 25-100 MG PO TABS
ORAL_TABLET | ORAL | Status: AC
Start: 2016-05-30 — End: 2016-05-30
  Filled 2016-05-30: qty 2

## 2016-05-30 NOTE — Progress Notes (Signed)
Placed patient on CPAP for the night.  Patient is tolerating well at this time. 

## 2016-05-30 NOTE — Progress Notes (Signed)
Patient Demographics:    Carol Mckee, is a 75 y.o. female, DOB - 09/30/1941, CBU:[id number]  Admit date - 05/26/2016   Admitting Physician Carol Laatsch Denton Brick, MD  Outpatient Primary MD for the patient is Carol Percy, MD  LOS - 3   Chief Complaint  Patient presents with  . Fatigue        Subjective:    Carol Mckee today has persistent  Fevers (T max > 101), no emesis,  Lethargy and AMS persist, Husband is at bedside, questions answered  Assessment  & Plan :    Principal Problem:   Severe sepsis ([place]) Active Problems:   Parkinson's disease ([place])   Encephalopathy, metabolic   Encephalopathy acute   UTI (urinary tract infection) with Sepsis   Gram negative septicemia ([facility])   Acute encephalopathy   1)Severe Klebsiella Sepsis Presumably from  urinary source- initial urine sample was with multiple growths, blood cultures from 05/26/2016 with Klebsiella pneumonia sensitive to Rocephin . Would repeat blood cultures due to persistent fevers and altered mentation, repeat urine cultures as well . Get renal ultrasound to rule out perinephric abscesses or pyelonephritis/to rule out complicated UTIs . Patient met sepsis criteria on admission, case discussed with on-call infection disease specialist Dr. Michel Bickers on 05/30/2016, he advised continuing IV Rocephin based on previous blood culture results.  2)Metabolic Encephalopathy-most likely secondary to #1 above, given persistent of altered mentation and lethargy would get MRI of the brain to exclude possible stroke. Altered mentation is most likely secondary to Klebsiella sepsis secondary to urinary source rule out acute stroke, CT head on admission had possible left frontal findings however pictures with motion degraded. MRI of the brain for stroke rule out depending  3)FEN/Dysphagia- speech pathology evaluation appreciated, swallowing difficulties related to  lethargy in a septic patient and underlying Parkinson's disease   4)Parkinson's Disease- worsening motor functions c/n Sinemet and baclofen    5) UTI-  treat as above in #1, hydrate as above in 1, renal ultrasound to rule out complicated UTI pending  6)Disposition- discussed with patient's husband at bedside, patient still has persistent fevers and altered mentation with lethargy . She will need physical therapy evaluation once medically stable to help determining level of functioning and possible disposition    7)Thrombocytopenia-  Patient has severe Thrombocytopenia with no active bleeding noted. Suspect low platelet count is related to underlying sepsis   Code Status : full   Consults  :  Discussed with Dr Michel Bickers, infectious disease on 05/30/16   DVT Prophylaxis  :   SCDs   Lab Results  Component Value Date   PLT 40* 05/30/2016    Inpatient Medications  Scheduled Meds: . aspirin EC  81 mg Oral Daily  . baclofen  10 mg Oral Daily  . carbidopa-levodopa  1.5 tablet Oral 6 X Daily  . cefTRIAXone (ROCEPHIN)  IV  2 g Intravenous Q24H  . cholecalciferol  5,000 Units Oral Daily  . DULoxetine  30 mg Oral Daily  . heparin  5,000 Units Subcutaneous Q8H  . magnesium oxide  400 mg Oral Daily  . multivitamin with minerals  1 tablet Oral Daily  . nystatin   Topical BID  . polyethylene glycol  17 g Oral Daily  . senna  1 tablet Oral BID  . sodium chloride  500 mL Intravenous Once  . sodium chloride flush  3 mL Intravenous Q12H  . sodium chloride flush  3 mL Intravenous Q12H  . vitamin E  400 Units Oral Daily   Continuous Infusions: . dextrose 5 % and 0.9% NaCl 75 mL/hr at 05/29/16 1355   PRN Meds:.sodium chloride, acetaminophen **OR** acetaminophen, albuterol, LORazepam, morphine injection, naphazoline-glycerin, ondansetron **OR** ondansetron (ZOFRAN) IV, oxyCODONE, polyethylene glycol, RESOURCE THICKENUP CLEAR, sodium chloride flush, traZODone    Anti-infectives    Start      Dose/Rate Route Frequency Ordered Stop   05/29/16 0941  cefTRIAXone (ROCEPHIN) 2 g in dextrose 5 % 50 mL IVPB     2 g 100 mL/hr over 30 Minutes Intravenous Every 24 hours 05/28/16 1419     05/27/16 2200  cefTRIAXone (ROCEPHIN) 1 g in dextrose 5 % 50 mL IVPB  Status:  Discontinued     1 g 100 mL/hr over 30 Minutes Intravenous Every 12 hours 05/27/16 1223 05/28/16 1419   05/27/16 2000  vancomycin (VANCOCIN) 1,250 mg in sodium chloride 0.9 % 250 mL IVPB  Status:  Discontinued     1,250 mg 166.7 mL/hr over 90 Minutes Intravenous Every 24 hours 05/26/16 1845 05/26/16 2048   05/27/16 0600  cefTRIAXone (ROCEPHIN) 1 g in dextrose 5 % 50 mL IVPB  Status:  Discontinued     1 g 100 mL/hr over 30 Minutes Intravenous Every 24 hours 05/27/16 0206 05/27/16 1223   05/27/16 0300  piperacillin-tazobactam (ZOSYN) IVPB 3.375 g  Status:  Discontinued     3.375 g 12.5 mL/hr over 240 Minutes Intravenous Every 8 hours 05/26/16 1844 05/26/16 2048   05/26/16 2100  cefTRIAXone (ROCEPHIN) 1 g in dextrose 5 % 50 mL IVPB  Status:  Discontinued     1 g 100 mL/hr over 30 Minutes Intravenous Every 24 hours 05/26/16 1920 05/27/16 0206   05/26/16 1845  vancomycin (VANCOCIN) 1,500 mg in sodium chloride 0.9 % 500 mL IVPB  Status:  Discontinued     1,500 mg 250 mL/hr over 120 Minutes Intravenous  Once 05/26/16 1838 05/27/16 1005   05/26/16 1830  piperacillin-tazobactam (ZOSYN) IVPB 3.375 g     3.375 g 100 mL/hr over 30 Minutes Intravenous  Once 05/26/16 1820 05/26/16 1936   05/26/16 1830  vancomycin (VANCOCIN) IVPB 1000 mg/200 mL premix  Status:  Discontinued     1,000 mg 200 mL/hr over 60 Minutes Intravenous  Once 05/26/16 1820 05/27/16 0906        Objective:   Filed Vitals:   05/29/16 1735 05/29/16 2018 05/29/16 2230 05/30/16 0538  BP:   132/62 157/69  Pulse:   65 90  Temp: 98.6 F (37 C) 99.5 F (37.5 C) 98.3 F (36.8 C) 99.4 F (37.4 C)  TempSrc: Rectal Rectal Rectal Oral  Resp:   20 15  Height:        Weight:      SpO2:   94% 97%    Wt Readings from Last 3 Encounters:  05/26/16 97 kg (213 lb 13.5 oz)  02/25/16 85.73 kg (189 lb)  08/28/15 87.998 kg (194 lb)     Intake/Output Summary (Last 24 hours) at 05/30/16 1153 Last data filed at 05/30/16 1044  Gross per 24 hour  Intake 1053.75 ml  Output    300 ml  Net 753.75 ml     Physical Exam  Gen:- Awake , lethargic, obese HEENT:- .AT, No sclera icterus Neck-Supple Neck,No  JVD,.  Lungs-  CTAB  CV- S1, S2 normal Abd-  +ve B.Sounds, Abd Soft, No tenderness,  No CVA tenderness Extremity/Skin:- No  edema,   No rash Back - no spine area tenderness    Data Review:   Micro Results Recent Results (from the past 240 hour(s))  Urine culture     Status: Abnormal   Collection Time: 05/26/16  5:02 PM  Result Value Ref Range Status   Specimen Description URINE, CLEAN CATCH  Final   Special Requests NONE  Final   Culture MULTIPLE SPECIES PRESENT, SUGGEST RECOLLECTION (A)  Final   Report Status 05/28/2016 FINAL  Final  Blood culture (routine x 2)     Status: Abnormal   Collection Time: 05/26/16  5:50 PM  Result Value Ref Range Status   Specimen Description BLOOD LEFT ARM  Final   Special Requests BOTTLES DRAWN AEROBIC AND ANAEROBIC 6CC EACH  Final   Culture  Setup Time   Final    GRAM NEGATIVE RODS RECOVERED FROM BOTH BOTTLES Gram Stain Report Called to,Read Back By and Verified With: DICKERSON,M. AT 0370 ON 05/27/2016 BY BAUGHAM,M. Performed at [facility], READ BACK BY AND VERIFIED WITH: L. SEAY, [place] AT [place] ON 05/27/16 BY C. JESSUP, MLT. Performed at [facility] (A)  Final   Report Status 05/29/2016 FINAL  Final   Organism ID, Bacteria KLEBSIELLA PNEUMONIAE  Final      Susceptibility   Klebsiella pneumoniae - MIC*    AMPICILLIN >=32 RESISTANT Resistant     CEFAZOLIN <=4 SENSITIVE Sensitive     CEFEPIME <=1 SENSITIVE Sensitive     CEFTAZIDIME <=1  SENSITIVE Sensitive     CEFTRIAXONE <=1 SENSITIVE Sensitive     CIPROFLOXACIN <=0.25 SENSITIVE Sensitive     GENTAMICIN <=1 SENSITIVE Sensitive     IMIPENEM <=0.25 SENSITIVE Sensitive     TRIMETH/SULFA <=20 SENSITIVE Sensitive     AMPICILLIN/SULBACTAM >=32 RESISTANT Resistant     PIP/TAZO 32 INTERMEDIATE Intermediate     * KLEBSIELLA PNEUMONIAE  Blood Culture ID Panel (Reflexed)     Status: Abnormal   Collection Time: 05/26/16  5:50 PM  Result Value Ref Range Status   Enterococcus species NOT DETECTED NOT DETECTED Final   Vancomycin resistance NOT DETECTED NOT DETECTED Final   Listeria monocytogenes NOT DETECTED NOT DETECTED Final   Staphylococcus species NOT DETECTED NOT DETECTED Final   Staphylococcus aureus NOT DETECTED NOT DETECTED Final   Methicillin resistance NOT DETECTED NOT DETECTED Final   Streptococcus species NOT DETECTED NOT DETECTED Final   Streptococcus agalactiae NOT DETECTED NOT DETECTED Final   Streptococcus pneumoniae NOT DETECTED NOT DETECTED Final   Streptococcus pyogenes NOT DETECTED NOT DETECTED Final   Acinetobacter baumannii NOT DETECTED NOT DETECTED Final   Enterobacteriaceae species DETECTED (A) NOT DETECTED Final    Comment: CRITICAL RESULT CALLED TO, READ BACK BY AND VERIFIED WITH: L. SEAY, RPH AT 1420 ON 05/27/16 BY C. JESSUP, MLT.    Enterobacter cloacae complex NOT DETECTED NOT DETECTED Final   Escherichia coli NOT DETECTED NOT DETECTED Final   Klebsiella oxytoca NOT DETECTED NOT DETECTED Final   Klebsiella pneumoniae DETECTED (A) NOT DETECTED Final    Comment: CRITICAL RESULT CALLED TO, READ BACK BY AND VERIFIED WITH: L. SEAY, RPH AT 1420 ON 05/27/16 BY C. JESSUP, MLT.    Proteus species NOT DETECTED NOT DETECTED Final   Serratia marcescens NOT DETECTED NOT DETECTED Final  Carbapenem resistance NOT DETECTED NOT DETECTED Final   Haemophilus influenzae NOT DETECTED NOT DETECTED Final   Neisseria meningitidis NOT DETECTED NOT DETECTED Final    Pseudomonas aeruginosa NOT DETECTED NOT DETECTED Final   Candida albicans NOT DETECTED NOT DETECTED Final   Candida glabrata NOT DETECTED NOT DETECTED Final   Candida krusei NOT DETECTED NOT DETECTED Final   Candida parapsilosis NOT DETECTED NOT DETECTED Final   Candida tropicalis NOT DETECTED NOT DETECTED Final    Comment: Performed at [facility]  Blood culture (routine x 2)     Status: Abnormal   Collection Time: 05/26/16  5:56 PM  Result Value Ref Range Status   Specimen Description BLOOD LEFT HAND  Final   Special Requests   Final    BOTTLES DRAWN AEROBIC AND ANAEROBIC AEB=6CC ANA=4CC   Culture  Setup Time   Final    GRAM NEGATIVE RODS RECOVERED FROM BOTH BOTTLES Gram Stain Report Called to,Read Back By and Verified With: DICKERSON,M. AT 2671 ON 05/27/2016 BY BAUGHAM,M. Performed at [facility]    Culture (A)  Final    KLEBSIELLA PNEUMONIAE SUSCEPTIBILITIES PERFORMED ON PREVIOUS CULTURE WITHIN THE LAST 5 DAYS. Performed at [facility]    Report Status 05/29/2016 FINAL  Final  Culture, blood (Routine X 2) w Reflex to ID Panel     Status: None (Preliminary result)   Collection Time: 05/30/16  8:53 AM  Result Value Ref Range Status   Specimen Description BLOOD BLOOD RIGHT HAND  Final   Special Requests BOTTLES DRAWN AEROBIC ONLY 4CC  Final   Culture PENDING  Incomplete   Report Status PENDING  Incomplete    Radiology Reports Dg Chest Port 1 View  05/26/2016  CLINICAL DATA:  Generalized weakness. EXAM: PORTABLE CHEST 1 VIEW COMPARISON:  None. FINDINGS: Low lung volumes. Heart is upper limits normal in size. No confluent opacities or effusions. No acute bony abnormality. IMPRESSION: Low lung volumes.  No active disease. Electronically Signed   By: Rolm Baptise M.D.   On: 05/26/2016 17:10   Ct Head Code Stroke W/o Cm  05/26/2016  CLINICAL DATA:  Parkinson's. Stuttering speech. Difficulty walking. EXAM: CT HEAD WITHOUT CONTRAST TECHNIQUE: Contiguous axial  images were obtained from the base of the skull through the vertex without intravenous contrast. COMPARISON:  CT 04/19/2012 FINDINGS: Image quality degraded by motion Ill-defined hypodensity left posterior frontal lobe. This is consistent with artifact of indeterminate age. This was not present previously. Hypodensity in the left anterior frontal lobe also consistent with ischemia, of indeterminate age. There is chronic ischemia in the left basal ganglia. Generalized atrophy. Mild chronic ischemic change in the right frontal white matter. Negative for hemorrhage or mass.  No shift of the midline structures No skull lesion identified. IMPRESSION: Image quality degraded by motion Left frontal hypodensities could represent acute or chronic ischemia. Correlate with symptoms and MRI. Progressive chronic ischemic changes in the left basal ganglia. These results were called by telephone at the time of interpretation on 05/26/2016 at 4:23 pm to Dr. Ezequiel Essex , who verbally acknowledged these results. Electronically Signed   By: Franchot Gallo M.D.   On: 05/26/2016 16:23     CBC  Recent Labs Lab 05/26/16 1535 05/26/16 1606 05/27/16 0207 05/28/16 0505 05/30/16 0858  WBC 9.8  --  8.9 7.0 7.3  HGB 12.3 13.3 11.8* 12.4 13.3  HCT 37.2 39.0 35.5* 38.3 39.7  PLT 91*  --  61* 38* 40*  MCV 101.1*  --  100.9* 101.1* 98.5  MCH 33.4  --  33.5 32.7 33.0  MCHC 33.1  --  33.2 32.4 33.5  RDW 13.3  --  13.5 13.7 13.3  LYMPHSABS 0.3*  --   --   --   --   MONOABS 0.7  --   --   --   --   EOSABS 0.0  --   --   --   --   BASOSABS 0.0  --   --   --   --     Chemistries   Recent Labs Lab 05/26/16 1535 05/26/16 1606 05/27/16 0207 05/28/16 0505 05/30/16 0858  NA 135 139 139 141 140  K 3.7 4.0 4.2 4.1 3.9  CL 104 101 110 112* 108  CO2 19*  --  22 21* 24  GLUCOSE 172* 167* 123* 125* 112*  BUN 28* 29* 25* 23* 20  CREATININE 1.57* 1.50* 1.05* 0.85 0.73  CALCIUM 8.3*  --  7.5* 7.9* 8.2*  AST 34  --   --    --   --   ALT <5*  --   --   --   --   ALKPHOS 47  --   --   --   --   BILITOT 0.8  --   --   --   --    ------------------------------------------------------------------------------------------------------------------ No results for input(s): CHOL, HDL, LDLCALC, TRIG, CHOLHDL, LDLDIRECT in the last 72 hours.  No results found for: HGBA1C ------------------------------------------------------------------------------------------------------------------ No results for input(s): TSH, T4TOTAL, T3FREE, THYROIDAB in the last 72 hours.  Invalid input(s): FREET3 ------------------------------------------------------------------------------------------------------------------ No results for input(s): VITAMINB12, FOLATE, FERRITIN, TIBC, IRON, RETICCTPCT in the last 72 hours.  Coagulation profile  Recent Labs Lab 05/26/16 1630  INR 1.45    No results for input(s): DDIMER in the last 72 hours.  Cardiac Enzymes  Recent Labs Lab 05/26/16 1535  TROPONINI <0.03   ------------------------------------------------------------------------------------------------------------------ No results found for: BNP   Teneil Shiller M.D on 05/30/2016 at 11:53 AM  Between 7am to 7pm - Pager - [phone]  After 7pm go to [email or web address] - password [email or web address]  [facility] -  Office  [phone]  Dragon dictation system was used to create this note, attempts have been made to correct errors, however presence of uncorrected errors is not a reflection quality of care provided

## 2016-05-30 NOTE — Progress Notes (Signed)
Pt family told this nurse "pt feels warmer than what temperature says on the machine".  Cooling blanket monitor showed pt temp to be 98.9.  Pt is warm to touch, checked temperature with manual rectal thermometer.  Temp is 102.1.  PRN tylenol suppository given.  Notified Dr Mariea ClontsEmokpae via text page.  Will continue to monitor pt.

## 2016-05-31 ENCOUNTER — Inpatient Hospital Stay (HOSPITAL_COMMUNITY): Payer: Medicare Other

## 2016-05-31 LAB — CBC WITH DIFFERENTIAL/PLATELET
Basophils Absolute: 0 10*3/uL (ref 0.0–0.1)
Basophils Relative: 0 %
EOS ABS: 0 10*3/uL (ref 0.0–0.7)
EOS PCT: 1 %
HCT: 31.9 % — ABNORMAL LOW (ref 36.0–46.0)
Hemoglobin: 10.7 g/dL — ABNORMAL LOW (ref 12.0–15.0)
LYMPHS ABS: 1 10*3/uL (ref 0.7–4.0)
Lymphocytes Relative: 12 %
MCH: 32.8 pg (ref 26.0–34.0)
MCHC: 33.5 g/dL (ref 30.0–36.0)
MCV: 97.9 fL (ref 78.0–100.0)
MONOS PCT: 15 %
Monocytes Absolute: 1.3 10*3/uL — ABNORMAL HIGH (ref 0.1–1.0)
Neutro Abs: 6.1 10*3/uL (ref 1.7–7.7)
Neutrophils Relative %: 72 %
PLATELETS: 66 10*3/uL — AB (ref 150–400)
RBC: 3.26 MIL/uL — ABNORMAL LOW (ref 3.87–5.11)
RDW: 13.3 % (ref 11.5–15.5)
WBC: 8.5 10*3/uL (ref 4.0–10.5)

## 2016-05-31 LAB — URINE CULTURE
Culture: NO GROWTH
SPECIAL REQUESTS: NORMAL

## 2016-05-31 LAB — BASIC METABOLIC PANEL
Anion gap: 6 (ref 5–15)
BUN: 19 mg/dL (ref 6–20)
CHLORIDE: 108 mmol/L (ref 101–111)
CO2: 25 mmol/L (ref 22–32)
CREATININE: 0.73 mg/dL (ref 0.44–1.00)
Calcium: 7.9 mg/dL — ABNORMAL LOW (ref 8.9–10.3)
GFR calc Af Amer: >60 mL/min (ref >60)
GFR calc non Af Amer: >60 mL/min (ref >60)
GLUCOSE: 157 mg/dL — AB (ref 65–99)
Potassium: 3.3 mmol/L — ABNORMAL LOW (ref 3.5–5.1)
Sodium: 139 mmol/L (ref 135–145)

## 2016-05-31 NOTE — Progress Notes (Signed)
Patient Demographics:    Carol Mckee, is a 75 y.o. female, DOB - 14-Mar-1941, WUJ:[id number]  Admit date - 05/26/2016   Admitting Physician Felipe Paluch Denton Brick, MD  Outpatient Primary MD for the patient is [name], MD  LOS - 4   Chief Complaint  Patient presents with  . Fatigue        Subjective:    Carol Mckee today has fevers over 102,  no emesis,  No chest pain,  No sob, has slight dry cough, no v/d, had BM, daughter at bedside   Assessment  & Plan :    Principal Problem:   Severe sepsis ([name]) Active Problems:   Parkinson's disease ([place])   Encephalopathy, metabolic   Encephalopathy acute   UTI (urinary tract infection) with Sepsis   Gram negative septicemia ([place])   Acute encephalopathy   1)Severe Klebsiella Sepsis Presumably from urinary source- initial urine sample from 05/26/16 was with multiple growths, blood cultures from 05/26/2016 with Klebsiella pneumonia sensitive to Rocephin . Repeat urine cx from 05/30/16 is neg and repeat Blood cx from 05/30/16 is neg so far.  Renal ultrasound w/o  perinephric abscesses or pyelonephritis, and no Obstructive uropathy.  Patient met sepsis criteria on admission, case discussed with on-call infection disease specialist Dr. Michel Bickers on 05/30/2016, he advised continuing IV Rocephin based on previous blood culture results from 05/26/16. Will get chest x-ray  2)Metabolic Encephalopathy-most likely secondary to #1 above, more awake and more interactive today, pt and her daughter decline brain MRI as she is very claustrophobic.  CT head on admission had possible left frontal findings however pictures with motion degraded.   3)FEN/Dysphagia- speech pathology evaluation appreciated, swallowing difficulties related to lethargy in a septic patient with underlying Parkinson's disease   4)Parkinson's Disease- worsening motor functions c/n Sinemet and baclofen  , physical therapy evaluation appreciated  5)Thrombocytopenia- Patient has severe Thrombocytopenia with no active bleeding noted. Suspect low platelet count is related to underlying sepsis , currently off heparin products, platelet count trending back up. No bleeding noted   6)Disposition- discussed with patient's daughter at bedside, patient still has persistent fevers , mental status has improved. C/n  physical therapy evaluation once medically stable to help determining level of functioning and possible disposition   Disposition Plan  :  Home with home health Vs SNF  Consults  :  Phone consult with ID physician   DVT Prophylaxis  : SCDs   Lab Results  Component Value Date   PLT 66* 05/31/2016    Inpatient Medications  Scheduled Meds: . baclofen  10 mg Oral Daily  . carbidopa-levodopa  1.5 tablet Oral 6 X Daily  . cefTRIAXone (ROCEPHIN)  IV  2 g Intravenous Q24H  . cholecalciferol  5,000 Units Oral Daily  . DULoxetine  30 mg Oral Daily  . magnesium oxide  400 mg Oral Daily  . multivitamin with minerals  1 tablet Oral Daily  . nystatin   Topical BID  . polyethylene glycol  17 g Oral Daily  . senna  1 tablet Oral BID  . sodium chloride  500 mL Intravenous Once  . sodium chloride flush  3 mL Intravenous Q12H  . sodium chloride flush  3 mL Intravenous Q12H  . vitamin E  400 Units Oral Daily   Continuous Infusions: . dextrose 5 % and 0.9% NaCl 75 mL/hr at 05/31/16 0526   PRN Meds:.sodium chloride, acetaminophen **OR** acetaminophen, albuterol, LORazepam, morphine injection, naphazoline-glycerin, ondansetron **OR** ondansetron (ZOFRAN) IV, oxyCODONE, polyethylene glycol, RESOURCE THICKENUP CLEAR, sodium chloride flush, traZODone    Anti-infectives    Start     Dose/Rate Route Frequency Ordered Stop   05/29/16 0941  cefTRIAXone (ROCEPHIN) 2 g in dextrose 5 % 50 mL IVPB     2 g 100 mL/hr over 30 Minutes Intravenous Every 24 hours 05/28/16 1419     05/27/16 2200   cefTRIAXone (ROCEPHIN) 1 g in dextrose 5 % 50 mL IVPB  Status:  Discontinued     1 g 100 mL/hr over 30 Minutes Intravenous Every 12 hours 05/27/16 1223 05/28/16 1419   05/27/16 2000  vancomycin (VANCOCIN) 1,250 mg in sodium chloride 0.9 % 250 mL IVPB  Status:  Discontinued     1,250 mg 166.7 mL/hr over 90 Minutes Intravenous Every 24 hours 05/26/16 1845 05/26/16 2048   05/27/16 0600  cefTRIAXone (ROCEPHIN) 1 g in dextrose 5 % 50 mL IVPB  Status:  Discontinued     1 g 100 mL/hr over 30 Minutes Intravenous Every 24 hours 05/27/16 0206 05/27/16 1223   05/27/16 0300  piperacillin-tazobactam (ZOSYN) IVPB 3.375 g  Status:  Discontinued     3.375 g 12.5 mL/hr over 240 Minutes Intravenous Every 8 hours 05/26/16 1844 05/26/16 2048   05/26/16 2100  cefTRIAXone (ROCEPHIN) 1 g in dextrose 5 % 50 mL IVPB  Status:  Discontinued     1 g 100 mL/hr over 30 Minutes Intravenous Every 24 hours 05/26/16 1920 05/27/16 0206   05/26/16 1845  vancomycin (VANCOCIN) 1,500 mg in sodium chloride 0.9 % 500 mL IVPB  Status:  Discontinued     1,500 mg 250 mL/hr over 120 Minutes Intravenous  Once 05/26/16 1838 05/27/16 1005   05/26/16 1830  piperacillin-tazobactam (ZOSYN) IVPB 3.375 g     3.375 g 100 mL/hr over 30 Minutes Intravenous  Once 05/26/16 1820 05/26/16 1936   05/26/16 1830  vancomycin (VANCOCIN) IVPB 1000 mg/200 mL premix  Status:  Discontinued     1,000 mg 200 mL/hr over 60 Minutes Intravenous  Once 05/26/16 1820 05/27/16 0906        Objective:   Filed Vitals:   05/31/16 0436 05/31/16 0548 05/31/16 0642 05/31/16 1511  BP:  165/59  124/45  Pulse:  90  76  Temp: 101.1 F (38.4 C) 100.3 F (37.9 C) 97.4 F (36.3 C) 99 F (37.2 C)  TempSrc: Other (Comment) Axillary Other (Comment) Oral  Resp:  20  20  Height:      Weight:      SpO2:  99%  93%    Wt Readings from Last 3 Encounters:  05/26/16 97 kg (213 lb 13.5 oz)  02/25/16 85.73 kg (189 lb)  08/28/15 87.998 kg (194 lb)     Intake/Output  Summary (Last 24 hours) at 05/31/16 1601 Last data filed at 05/31/16 1156  Gross per 24 hour  Intake   1960 ml  Output      0 ml  Net   1960 ml     Physical Exam  Gen:- Awake Alert,  In no apparent distress , more awake and more interactive today HEENT:- [place].AT, No sclera icterus Neck-Supple Neck,No JVD,.  Lungs-  CTAB  CV- S1, S2 normal Abd-  +ve B.Sounds, Abd Soft, No tenderness,   No  CVA tenderness Extremity/Skin:- No  edema,   , SCDs on Neuropsych-Parkinsonian tremors especially on the right, no new focal neurological deficits at this time, altered mental status appears to be improving.     Data Review:   Micro Results Recent Results (from the past 240 hour(s))  Urine culture     Status: Abnormal   Collection Time: 05/26/16  5:02 PM  Result Value Ref Range Status   Specimen Description URINE, CLEAN CATCH  Final   Special Requests NONE  Final   Culture MULTIPLE SPECIES PRESENT, SUGGEST RECOLLECTION (A)  Final   Report Status 05/28/2016 FINAL  Final  Blood culture (routine x 2)     Status: Abnormal   Collection Time: 05/26/16  5:50 PM  Result Value Ref Range Status   Specimen Description BLOOD LEFT ARM  Final   Special Requests BOTTLES DRAWN AEROBIC AND ANAEROBIC 6CC EACH  Final   Culture  Setup Time   Final    GRAM NEGATIVE RODS RECOVERED FROM BOTH BOTTLES Gram Stain Report Called to,Read Back By and Verified With: DICKERSON,M. AT 7628 ON 05/27/2016 BY BAUGHAM,M. Performed at [facility], READ BACK BY AND VERIFIED WITH: L. SEAY, [place] AT [place] ON 05/27/16 BY C. JESSUP, MLT. Performed at [facility] (A)  Final   Report Status 05/29/2016 FINAL  Final   Organism ID, Bacteria KLEBSIELLA PNEUMONIAE  Final      Susceptibility   Klebsiella pneumoniae - MIC*    AMPICILLIN >=32 RESISTANT Resistant     CEFAZOLIN <=4 SENSITIVE Sensitive     CEFEPIME <=1 SENSITIVE Sensitive     CEFTAZIDIME <=1 SENSITIVE  Sensitive     CEFTRIAXONE <=1 SENSITIVE Sensitive     CIPROFLOXACIN <=0.25 SENSITIVE Sensitive     GENTAMICIN <=1 SENSITIVE Sensitive     IMIPENEM <=0.25 SENSITIVE Sensitive     TRIMETH/SULFA <=20 SENSITIVE Sensitive     AMPICILLIN/SULBACTAM >=32 RESISTANT Resistant     PIP/TAZO 32 INTERMEDIATE Intermediate     * KLEBSIELLA PNEUMONIAE  Blood Culture ID Panel (Reflexed)     Status: Abnormal   Collection Time: 05/26/16  5:50 PM  Result Value Ref Range Status   Enterococcus species NOT DETECTED NOT DETECTED Final   Vancomycin resistance NOT DETECTED NOT DETECTED Final   Listeria monocytogenes NOT DETECTED NOT DETECTED Final   Staphylococcus species NOT DETECTED NOT DETECTED Final   Staphylococcus aureus NOT DETECTED NOT DETECTED Final   Methicillin resistance NOT DETECTED NOT DETECTED Final   Streptococcus species NOT DETECTED NOT DETECTED Final   Streptococcus agalactiae NOT DETECTED NOT DETECTED Final   Streptococcus pneumoniae NOT DETECTED NOT DETECTED Final   Streptococcus pyogenes NOT DETECTED NOT DETECTED Final   Acinetobacter baumannii NOT DETECTED NOT DETECTED Final   Enterobacteriaceae species DETECTED (A) NOT DETECTED Final    Comment: CRITICAL RESULT CALLED TO, READ BACK BY AND VERIFIED WITH: L. SEAY, RPH AT 1420 ON 05/27/16 BY C. JESSUP, MLT.    Enterobacter cloacae complex NOT DETECTED NOT DETECTED Final   Escherichia coli NOT DETECTED NOT DETECTED Final   Klebsiella oxytoca NOT DETECTED NOT DETECTED Final   Klebsiella pneumoniae DETECTED (A) NOT DETECTED Final    Comment: CRITICAL RESULT CALLED TO, READ BACK BY AND VERIFIED WITH: L. SEAY, RPH AT 1420 ON 05/27/16 BY C. JESSUP, MLT.    Proteus species NOT DETECTED NOT DETECTED Final   Serratia marcescens NOT DETECTED NOT DETECTED Final   Carbapenem resistance  NOT DETECTED NOT DETECTED Final   Haemophilus influenzae NOT DETECTED NOT DETECTED Final   Neisseria meningitidis NOT DETECTED NOT DETECTED Final   Pseudomonas  aeruginosa NOT DETECTED NOT DETECTED Final   Candida albicans NOT DETECTED NOT DETECTED Final   Candida glabrata NOT DETECTED NOT DETECTED Final   Candida krusei NOT DETECTED NOT DETECTED Final   Candida parapsilosis NOT DETECTED NOT DETECTED Final   Candida tropicalis NOT DETECTED NOT DETECTED Final    Comment: Performed at [facility]  Blood culture (routine x 2)     Status: Abnormal   Collection Time: 05/26/16  5:56 PM  Result Value Ref Range Status   Specimen Description BLOOD LEFT HAND  Final   Special Requests   Final    BOTTLES DRAWN AEROBIC AND ANAEROBIC AEB=6CC ANA=4CC   Culture  Setup Time   Final    GRAM NEGATIVE RODS RECOVERED FROM BOTH BOTTLES Gram Stain Report Called to,Read Back By and Verified With: DICKERSON,M. AT 0277 ON 05/27/2016 BY BAUGHAM,M. Performed at [facility]    Culture (A)  Final    KLEBSIELLA PNEUMONIAE SUSCEPTIBILITIES PERFORMED ON PREVIOUS CULTURE WITHIN THE LAST 5 DAYS. Performed at [facility]    Report Status 05/29/2016 FINAL  Final  Culture, blood (Routine X 2) w Reflex to ID Panel     Status: None (Preliminary result)   Collection Time: 05/30/16  8:53 AM  Result Value Ref Range Status   Specimen Description BLOOD BLOOD RIGHT HAND  Final   Special Requests BOTTLES DRAWN AEROBIC ONLY 4CC  Final   Culture NO GROWTH 1 DAY  Final   Report Status PENDING  Incomplete  Culture, Urine     Status: None   Collection Time: 05/30/16  9:10 AM  Result Value Ref Range Status   Specimen Description URINE, CLEAN CATCH  Final   Special Requests Normal  Final   Culture NO GROWTH Performed at [facility]   Final   Report Status 05/31/2016 FINAL  Final  Culture, blood (single)     Status: None (Preliminary result)   Collection Time: 05/30/16  7:55 PM  Result Value Ref Range Status   Specimen Description BLOOD LEFT ARM  Final   Special Requests BOTTLES DRAWN AEROBIC ONLY 4CC ONLY  Final   Culture NO GROWTH < 24 HOURS  Final     Report Status PENDING  Incomplete    Radiology Reports US Renal  05/31/2016  CLINICAL DATA:  Urinary tract infection and bacteremia. EXAM: RENAL / URINARY TRACT ULTRASOUND COMPLETE COMPARISON:  None. FINDINGS: Right Kidney: Length: 11.8 cm. Echogenicity within normal limits. No renal mass, abscess or hydronephrosis visualized. No evidence of perinephric fluid. Left Kidney: Length: 12.4 cm. Echogenicity within normal limits. No renal mass, abscess, or hydronephrosis visualized. No evidence of perinephric fluid. Bladder: Appears normal for degree of bladder distention. IMPRESSION: Negative. No evidence of renal parenchymal abnormality or hydronephrosis. Electronically Signed   By: Earle Gell M.D.   On: 05/31/2016 11:43   Dg Chest Port 1 View  05/26/2016  CLINICAL DATA:  Generalized weakness. EXAM: PORTABLE CHEST 1 VIEW COMPARISON:  None. FINDINGS: Low lung volumes. Heart is upper limits normal in size. No confluent opacities or effusions. No acute bony abnormality. IMPRESSION: Low lung volumes.  No active disease. Electronically Signed   By: Rolm Baptise M.D.   On: 05/26/2016 17:10   Ct Head Code Stroke W/o Cm  05/26/2016  CLINICAL DATA:  Parkinson's. Stuttering speech. Difficulty walking. EXAM:  CT HEAD WITHOUT CONTRAST TECHNIQUE: Contiguous axial images were obtained from the base of the skull through the vertex without intravenous contrast. COMPARISON:  CT 04/19/2012 FINDINGS: Image quality degraded by motion Ill-defined hypodensity left posterior frontal lobe. This is consistent with artifact of indeterminate age. This was not present previously. Hypodensity in the left anterior frontal lobe also consistent with ischemia, of indeterminate age. There is chronic ischemia in the left basal ganglia. Generalized atrophy. Mild chronic ischemic change in the right frontal white matter. Negative for hemorrhage or mass.  No shift of the midline structures No skull lesion identified. IMPRESSION: Image quality  degraded by motion Left frontal hypodensities could represent acute or chronic ischemia. Correlate with symptoms and MRI. Progressive chronic ischemic changes in the left basal ganglia. These results were called by telephone at the time of interpretation on 05/26/2016 at 4:23 pm to Dr. Ezequiel Essex , who verbally acknowledged these results. Electronically Signed   By: Franchot Gallo M.D.   On: 05/26/2016 16:23     CBC  Recent Labs Lab 05/26/16 1535 05/26/16 1606 05/27/16 0207 05/28/16 0505 05/30/16 0858 05/31/16 0437  WBC 9.8  --  8.9 7.0 7.3 8.5  HGB 12.3 13.3 11.8* 12.4 13.3 10.7*  HCT 37.2 39.0 35.5* 38.3 39.7 31.9*  PLT 91*  --  61* 38* 40* 66*  MCV 101.1*  --  100.9* 101.1* 98.5 97.9  MCH 33.4  --  33.5 32.7 33.0 32.8  MCHC 33.1  --  33.2 32.4 33.5 33.5  RDW 13.3  --  13.5 13.7 13.3 13.3  LYMPHSABS 0.3*  --   --   --   --  1.0  MONOABS 0.7  --   --   --   --  1.3*  EOSABS 0.0  --   --   --   --  0.0  BASOSABS 0.0  --   --   --   --  0.0    Chemistries   Recent Labs Lab 05/26/16 1535 05/26/16 1606 05/27/16 0207 05/28/16 0505 05/30/16 0858 05/31/16 0436  NA 135 139 139 141 140 139  K 3.7 4.0 4.2 4.1 3.9 3.3*  CL 104 101 110 112* 108 108  CO2 19*  --  22 21* 24 25  GLUCOSE 172* 167* 123* 125* 112* 157*  BUN 28* 29* 25* 23* 20 19  CREATININE 1.57* 1.50* 1.05* 0.85 0.73 0.73  CALCIUM 8.3*  --  7.5* 7.9* 8.2* 7.9*  AST 34  --   --   --   --   --   ALT <5*  --   --   --   --   --   ALKPHOS 47  --   --   --   --   --   BILITOT 0.8  --   --   --   --   --    ------------------------------------------------------------------------------------------------------------------ No results for input(s): CHOL, HDL, LDLCALC, TRIG, CHOLHDL, LDLDIRECT in the last 72 hours.  No results found for: HGBA1C ------------------------------------------------------------------------------------------------------------------ No results for input(s): TSH, T4TOTAL, T3FREE, THYROIDAB in  the last 72 hours.  Invalid input(s): FREET3 ------------------------------------------------------------------------------------------------------------------ No results for input(s): VITAMINB12, FOLATE, FERRITIN, TIBC, IRON, RETICCTPCT in the last 72 hours.  Coagulation profile  Recent Labs Lab 05/26/16 1630  INR 1.45    No results for input(s): DDIMER in the last 72 hours.  Cardiac Enzymes  Recent Labs Lab 05/26/16 1535  TROPONINI <0.03   ------------------------------------------------------------------------------------------------------------------ No results found for: BNP  Denton Brick Carol Mckee M.D on 05/31/2016 at 4:01 PM  Between 7am to 7pm - Pager - [phone]  After 7pm go to [email or web address] - password [email or web address]  [facility] -  Office  [phone]  Dragon dictation system was used to create this note, attempts have been made to correct errors, however presence of uncorrected errors is not a reflection quality of care provided

## 2016-05-31 NOTE — Progress Notes (Signed)
Physical Therapy Treatment Patient Details Name: Carol DresserLeola Mckee MRN: [id number] DOB: 1941-01-03 Today's Date: 05/31/2016    History of Present Illness 75 yo F admitted with AMS and speech problems. Dx: Severe sepsis from urinary source, metabolic encephalopathy. Head CT shows L frontal hypodensities which could represent acute or chronic ischemia. Awaiting MRI. PMH: Parkinson's, paralysis agitans, stiff shoulder, gait abnormality, spinal stenosis.    PT Comments    Pt received in bed, and was agreeable to PT tx.  Pt's dtr also present, and assisted throughout tx.  Pt requires Max A for supine<>sit, and max A for bed<>chair transfer with RW.  She continues to have difficulty with weight shifting right and left during transfer.  Pt and family continue to express that they desire to go home at discharge and she will have 24/7 supervision/assistance.  Continue to recommend HHPT upon d/c.   Follow Up Recommendations  Home health PT;Supervision/Assistance - 24 hour     Equipment Recommendations  None recommended by PT    Recommendations for Other Services       Precautions / Restrictions Precautions Precautions: Fall Precaution Comments: Pt reports she had a fall last december when she was in the bathroom.  Restrictions Weight Bearing Restrictions: No    Mobility  Bed Mobility   Bed Mobility: Supine to Sit     Supine to sit: Max assist;HOB elevated        Transfers   Equipment used: Rolling walker (2 wheeled) Transfers: Sit to/from [employer] CorporationStand;Stand Pivot Transfers Sit to Stand: Mod assist;+2 safety/equipment Stand pivot transfers: Max assist;+2 safety/equipment       General transfer comment: Dtr present and is very hands on with the care of her mother. Upon standing, pt expressed "You're going to need a mop" referring to the fact that she needed to urinate.  Bed pan obtained, and pt sat on bed pan on the EOB.  Pt successfully urinated, and required Max A for peri-hygiene.  Pt  then was able to take a few very slow and labored steps to the chair.   PT assisted R foot for stepping, and assisted with weight shifting each direction.  Pt demonstrates a very flexed fwd posture with a posterior lean, therefore, assistance required to maintain pt's balance over her center of gravity.   Ambulation/Gait                 Stairs            Wheelchair Mobility    Modified Rankin (Stroke Patients Only)       Balance   Sitting-balance support: Bilateral upper extremity supported Sitting balance-Leahy Scale: Fair Sitting balance - Comments: Posterior lean   Standing balance support: Bilateral upper extremity supported Standing balance-Leahy Scale: Fair Standing balance comment: Posterior lean with fwd flexed trunk.                    Cognition Arousal/Alertness: Awake/alert Behavior During Therapy: WFL for tasks assessed/performed Overall Cognitive Status: Within Functional Limits for tasks assessed                      Exercises      General Comments General comments (skin integrity, edema, etc.): Noted indentations from pt's cooling/warming blanket that has been underneath her, also noted indentation from pulse oximeter, and opened area on her R scapula      Pertinent Vitals/Pain Pain Assessment: No/denies pain    Home Living  Prior Function            PT Goals (current goals can now be found in the care plan section) Acute Rehab PT Goals Patient Stated Goal: Pt wants to go home. PT Goal Formulation: With patient/family Time For Goal Achievement: 06/04/16 Potential to Achieve Goals: Good Progress towards PT goals: Progressing toward goals    Frequency  Min 5X/week    PT Plan      Co-evaluation             End of Session Equipment Utilized During Treatment: Gait belt Activity Tolerance: Patient tolerated treatment well;No increased pain Patient left: in chair;with family/visitor  present     Time: 1102-1130 PT Time Calculation (min) (ACUTE ONLY): 28 min  Charges:  $Therapeutic Activity: 23-37 mins                    G Codes:      Carol Mckee 05/31/2016, 12:23 PM

## 2016-05-31 NOTE — Care Management Important Message (Signed)
Important Message  Patient Details  Name: Carol DresserLeola Mckee MRN: [id number] Date of Birth: 1941/10/24   Medicare Important Message Given:  Yes    [name], Bradyn Vassey Demske, RN 05/31/2016, 9:15 AM

## 2016-06-01 ENCOUNTER — Inpatient Hospital Stay (HOSPITAL_COMMUNITY): Payer: Medicare Other

## 2016-06-01 DIAGNOSIS — G20 Parkinson's disease: Secondary | ICD-10-CM

## 2016-06-01 DIAGNOSIS — A419 Sepsis, unspecified organism: Secondary | ICD-10-CM

## 2016-06-01 DIAGNOSIS — N390 Urinary tract infection, site not specified: Secondary | ICD-10-CM

## 2016-06-01 DIAGNOSIS — G9340 Encephalopathy, unspecified: Secondary | ICD-10-CM

## 2016-06-01 DIAGNOSIS — A4150 Gram-negative sepsis, unspecified: Secondary | ICD-10-CM

## 2016-06-01 DIAGNOSIS — R6520 Severe sepsis without septic shock: Secondary | ICD-10-CM

## 2016-06-01 NOTE — Progress Notes (Signed)
Speech Language Pathology Treatment: Dysphagia  Patient Details Name: Carol DresserLeola Finkbiner MRN: [id number] DOB: [birth year] Today's Date: 06/01/2016 Time: [id number] SLP Time Calculation (min) (ACUTE ONLY): 18 min  Assessment / Plan / Recommendation Clinical Impression  SLP continued follow up for dysphagia intervention. Pts daughter present for safe swallow education. Nursing reports improved family compliance with current diet recommendations. Pt without overt signs or symptoms of aspiration with thin liquids this date though swallow function felt to greatly fluctuate. Anticipate low endurance and fatigue with thin liquids across a meal.  Given hx of episodic aspiration, progression of PD, and decreased respiratory strength with poor ability to initiate a cough, recommend continuing conservative diet of dysphagia 2 (chopped) and nectar thick liquids.  Recommend [employer] Water Protocol (water in between meals following oral care). Family in agreement with goals of care. ST to continue to monitor.     HPI HPI: Carol DresserLeola Hamidi is a 75 yo woman who      SLP Plan  Continue with current plan of care     Recommendations  Diet recommendations: Dysphagia 2 (chopped) Nectar-thick liquid;Other(comment) Bascom Levels(Frazier Water Protocol (water in between meals after oral care) Liquids provided via: Cup;Straw Medication Administration: Whole meds with puree Supervision: Full supervision/cueing for compensatory strategies;Staff to assist with self feeding Compensations: Slow rate;Small sips/bites;Multiple dry swallows after each bite/sip Postural Changes and/or Swallow Maneuvers: Seated upright 90 degrees;Upright 30-60 min after meal             Oral Care Recommendations: Oral care BID;Oral care prior to ice chip/H20 Follow up Recommendations: Skilled Nursing facility;24 hour supervision/assistance Plan: Continue with current plan of care     GO               Marcene Duoshelsea Sumney MA, CCC-SLP Acute Care Speech Language  Pathologist    Kennieth RadSumney, Naida Escalante E 06/01/2016, 3:35 PM

## 2016-06-01 NOTE — Progress Notes (Signed)
Patient has order for continuous pulse ox.  Dr. Kerry HoughMemon gave order to discontinue continuous pulse ox.

## 2016-06-01 NOTE — Progress Notes (Signed)
PT Cancellation Note  Patient Details Name: Carol Mckee MRN: [id number] DOB: [birth year]   Cancelled Treatment:    Reason Eval/Treat Not Completed: Fatigue/lethargy limiting ability to participate (Attempted to see pt this PM, however upon entering, pt was sleeping, and dtr stated that she had a busy day today, and was not able to get much rest. Will check back tomorrow as schedule allows)   Carollee HerterBeth Melvenia Favela, PT, DPT X: [phone]

## 2016-06-01 NOTE — Progress Notes (Signed)
Patient Demographics:    Carol Mckee, is a 75 y.o. female, DOB - [birth year], BEE:[id number]  Admit date - 05/26/2016   Admitting Physician Courage Denton Brick, MD  Outpatient Primary MD for the patient is [name], MD  LOS - 5   Chief Complaint  Patient presents with  . Fatigue        Subjective:    Carol Mckee today has fevers over 102,  no emesis,  No chest pain,  No sob, has slight dry cough, no v/d, had BM, daughter at bedside   Assessment  & Plan :    Principal Problem:   Severe sepsis (Carol Mckee) Active Problems:   Parkinson's disease (Carol Mckee)   Encephalopathy, metabolic   Encephalopathy acute   UTI (urinary tract infection) with Sepsis   Gram negative septicemia (Carol Mckee)   Acute encephalopathy   1)Severe Klebsiella Sepsis Presumably from urinary source- initial urine sample from 05/26/16 was with multiple growths, blood cultures from 05/26/2016 with Klebsiella pneumonia sensitive to Rocephin . Repeat urine cx from 05/30/16 is neg and repeat Blood cx from 05/30/16 is neg so far.  Renal ultrasound w/o  perinephric abscesses or pyelonephritis, and no Obstructive uropathy.  Patient met sepsis criteria on admission, case discussed with on-call infection disease specialist Dr. Michel Bickers on 05/30/2016, he advised continuing IV Rocephin based on previous blood culture results from 05/26/16. Chest xray shows left sided infiltrate. Clinically she is improving, so will not make any changes in treatment. She will need repeat chest xray in 3-4 weeks to ensure resolution.  2)Metabolic Encephalopathy-most likely secondary to #1 above, more awake and more interactive today, pt and her daughter decline brain MRI as she is very claustrophobic.  CT head on admission had possible left frontal findings however pictures with motion degraded.   3)FEN/Dysphagia- speech pathology evaluation appreciated, she is currently on a  modified diet.  4)Parkinson's Disease- worsening motor functions c/n Sinemet and baclofen, physical therapy evaluation appreciated  5)Thrombocytopenia- Patient has severe Thrombocytopenia with no active bleeding noted. Suspect low platelet count is related to underlying sepsis , currently off heparin products, platelet count trending back up. No bleeding noted   6)Disposition- discussed with patient's daughter at bedside. She is ready to take patient home when she is medically stable   Disposition Plan  :  Home with home health   Consults  :  Phone consult with ID physician   DVT Prophylaxis  : SCDs   Lab Results  Component Value Date   PLT 66* 05/31/2016    Inpatient Medications  Scheduled Meds: . baclofen  10 mg Oral Daily  . carbidopa-levodopa  1.5 tablet Oral 6 X Daily  . cefTRIAXone (ROCEPHIN)  IV  2 g Intravenous Q24H  . cholecalciferol  5,000 Units Oral Daily  . DULoxetine  30 mg Oral Daily  . magnesium oxide  400 mg Oral Daily  . multivitamin with minerals  1 tablet Oral Daily  . nystatin   Topical BID  . polyethylene glycol  17 g Oral Daily  . senna  1 tablet Oral BID  . sodium chloride  500 mL Intravenous Once  . sodium chloride flush  3 mL Intravenous Q12H  . sodium chloride flush  3 mL Intravenous Q12H  . vitamin  E  400 Units Oral Daily   Continuous Infusions:   PRN Meds:.sodium chloride, acetaminophen **OR** acetaminophen, albuterol, LORazepam, morphine injection, naphazoline-glycerin, ondansetron **OR** ondansetron (ZOFRAN) IV, oxyCODONE, polyethylene glycol, RESOURCE THICKENUP CLEAR, sodium chloride flush, traZODone    Anti-infectives    Start     Dose/Rate Route Frequency Ordered Stop   05/29/16 0941  cefTRIAXone (ROCEPHIN) 2 g in dextrose 5 % 50 mL IVPB     2 g 100 mL/hr over 30 Minutes Intravenous Every 24 hours 05/28/16 1419     05/27/16 2200  cefTRIAXone (ROCEPHIN) 1 g in dextrose 5 % 50 mL IVPB  Status:  Discontinued     1 g 100 mL/hr over 30  Minutes Intravenous Every 12 hours 05/27/16 1223 05/28/16 1419   05/27/16 2000  vancomycin (VANCOCIN) 1,250 mg in sodium chloride 0.9 % 250 mL IVPB  Status:  Discontinued     1,250 mg 166.7 mL/hr over 90 Minutes Intravenous Every 24 hours 05/26/16 1845 05/26/16 2048   05/27/16 0600  cefTRIAXone (ROCEPHIN) 1 g in dextrose 5 % 50 mL IVPB  Status:  Discontinued     1 g 100 mL/hr over 30 Minutes Intravenous Every 24 hours 05/27/16 0206 05/27/16 1223   05/27/16 0300  piperacillin-tazobactam (ZOSYN) IVPB 3.375 g  Status:  Discontinued     3.375 g 12.5 mL/hr over 240 Minutes Intravenous Every 8 hours 05/26/16 1844 05/26/16 2048   05/26/16 2100  cefTRIAXone (ROCEPHIN) 1 g in dextrose 5 % 50 mL IVPB  Status:  Discontinued     1 g 100 mL/hr over 30 Minutes Intravenous Every 24 hours 05/26/16 1920 05/27/16 0206   05/26/16 1845  vancomycin (VANCOCIN) 1,500 mg in sodium chloride 0.9 % 500 mL IVPB  Status:  Discontinued     1,500 mg 250 mL/hr over 120 Minutes Intravenous  Once 05/26/16 1838 05/27/16 1005   05/26/16 1830  piperacillin-tazobactam (ZOSYN) IVPB 3.375 g     3.375 g 100 mL/hr over 30 Minutes Intravenous  Once 05/26/16 1820 05/26/16 1936   05/26/16 1830  vancomycin (VANCOCIN) IVPB 1000 mg/200 mL premix  Status:  Discontinued     1,000 mg 200 mL/hr over 60 Minutes Intravenous  Once 05/26/16 1820 05/27/16 0906        Objective:   Filed Vitals:   05/31/16 2227 06/01/16 0643 06/01/16 1416 06/01/16 1420  BP:  138/47 168/54   Pulse:  81 82   Temp:  97.5 F (36.4 C) 98.7 F (37.1 C)   TempSrc:  Rectal Oral   Resp: '19 20 20   '$ Height:      Weight:      SpO2: 97% 96% 84% 100%    Wt Readings from Last 3 Encounters:  05/26/16 97 kg (213 lb 13.5 oz)  02/25/16 85.73 kg (189 lb)  08/28/15 87.998 kg (194 lb)     Intake/Output Summary (Last 24 hours) at 06/01/16 1522 Last data filed at 06/01/16 1417  Gross per 24 hour  Intake 2954.5 ml  Output    700 ml  Net 2254.5 ml     Physical  Exam  Gen:- Awake Alert,  In no apparent distress , more awake and more interactive today HEENT:- [place].AT, No sclera icterus Neck-Supple Neck,No JVD,.  Lungs-  Diminished breath sounds at bases CV- S1, S2 normal Abd-  +ve B.Sounds, Abd Soft, No tenderness,   No CVA tenderness Extremity/Skin:- No pedal edema,  She does have 1+ edema in arms , SCDs on Neuropsych-Parkinsonian tremors especially on  the right, no new focal neurological deficits at this time, altered mental status appears to be improving.     Data Review:   Micro Results Recent Results (from the past 240 hour(s))  Urine culture     Status: Abnormal   Collection Time: 05/26/16  5:02 PM  Result Value Ref Range Status   Specimen Description URINE, CLEAN CATCH  Final   Special Requests NONE  Final   Culture MULTIPLE SPECIES PRESENT, SUGGEST RECOLLECTION (A)  Final   Report Status 05/28/2016 FINAL  Final  Blood culture (routine x 2)     Status: Abnormal   Collection Time: 05/26/16  5:50 PM  Result Value Ref Range Status   Specimen Description BLOOD LEFT ARM  Final   Special Requests BOTTLES DRAWN AEROBIC AND ANAEROBIC 6CC EACH  Final   Culture  Setup Time   Final    GRAM NEGATIVE RODS RECOVERED FROM BOTH BOTTLES Gram Stain Report Called to,Read Back By and Verified With: DICKERSON,M. AT 4098 ON 05/27/2016 BY BAUGHAM,M. Performed at [place], READ BACK BY AND VERIFIED WITH: L. SEAY, [place] AT [place] ON 05/27/16 BY C. JESSUP, MLT. Performed at [place] (A)  Final   Report Status 05/29/2016 FINAL  Final   Organism ID, Bacteria KLEBSIELLA PNEUMONIAE  Final      Susceptibility   Klebsiella pneumoniae - MIC*    AMPICILLIN >=32 RESISTANT Resistant     CEFAZOLIN <=4 SENSITIVE Sensitive     CEFEPIME <=1 SENSITIVE Sensitive     CEFTAZIDIME <=1 SENSITIVE Sensitive     CEFTRIAXONE <=1 SENSITIVE Sensitive     CIPROFLOXACIN <=0.25 SENSITIVE Sensitive      GENTAMICIN <=1 SENSITIVE Sensitive     IMIPENEM <=0.25 SENSITIVE Sensitive     TRIMETH/SULFA <=20 SENSITIVE Sensitive     AMPICILLIN/SULBACTAM >=32 RESISTANT Resistant     PIP/TAZO 32 INTERMEDIATE Intermediate     * KLEBSIELLA PNEUMONIAE  Blood Culture ID Panel (Reflexed)     Status: Abnormal   Collection Time: 05/26/16  5:50 PM  Result Value Ref Range Status   Enterococcus species NOT DETECTED NOT DETECTED Final   Vancomycin resistance NOT DETECTED NOT DETECTED Final   Listeria monocytogenes NOT DETECTED NOT DETECTED Final   Staphylococcus species NOT DETECTED NOT DETECTED Final   Staphylococcus aureus NOT DETECTED NOT DETECTED Final   Methicillin resistance NOT DETECTED NOT DETECTED Final   Streptococcus species NOT DETECTED NOT DETECTED Final   Streptococcus agalactiae NOT DETECTED NOT DETECTED Final   Streptococcus pneumoniae NOT DETECTED NOT DETECTED Final   Streptococcus pyogenes NOT DETECTED NOT DETECTED Final   Acinetobacter baumannii NOT DETECTED NOT DETECTED Final   Enterobacteriaceae species DETECTED (A) NOT DETECTED Final    Comment: CRITICAL RESULT CALLED TO, READ BACK BY AND VERIFIED WITH: L. SEAY, RPH AT 1420 ON 05/27/16 BY C. JESSUP, MLT.    Enterobacter cloacae complex NOT DETECTED NOT DETECTED Final   Escherichia coli NOT DETECTED NOT DETECTED Final   Klebsiella oxytoca NOT DETECTED NOT DETECTED Final   Klebsiella pneumoniae DETECTED (A) NOT DETECTED Final    Comment: CRITICAL RESULT CALLED TO, READ BACK BY AND VERIFIED WITH: L. SEAY, RPH AT 1420 ON 05/27/16 BY C. JESSUP, MLT.    Proteus species NOT DETECTED NOT DETECTED Final   Serratia marcescens NOT DETECTED NOT DETECTED Final   Carbapenem resistance NOT DETECTED NOT DETECTED Final   Haemophilus influenzae NOT DETECTED NOT DETECTED Final  Neisseria meningitidis NOT DETECTED NOT DETECTED Final   Pseudomonas aeruginosa NOT DETECTED NOT DETECTED Final   Candida albicans NOT DETECTED NOT DETECTED Final   Candida  glabrata NOT DETECTED NOT DETECTED Final   Candida krusei NOT DETECTED NOT DETECTED Final   Candida parapsilosis NOT DETECTED NOT DETECTED Final   Candida tropicalis NOT DETECTED NOT DETECTED Final    Comment: Performed at [facility]  Blood culture (routine x 2)     Status: Abnormal   Collection Time: 05/26/16  5:56 PM  Result Value Ref Range Status   Specimen Description BLOOD LEFT HAND  Final   Special Requests   Final    BOTTLES DRAWN AEROBIC AND ANAEROBIC AEB=6CC ANA=4CC   Culture  Setup Time   Final    GRAM NEGATIVE RODS RECOVERED FROM BOTH BOTTLES Gram Stain Report Called to,Read Back By and Verified With: DICKERSON,M. AT 1194 ON 05/27/2016 BY BAUGHAM,M. Performed at [facility]    Culture (A)  Final    KLEBSIELLA PNEUMONIAE SUSCEPTIBILITIES PERFORMED ON PREVIOUS CULTURE WITHIN THE LAST 5 DAYS. Performed at [facility]    Report Status 05/29/2016 FINAL  Final  Culture, blood (Routine X 2) w Reflex to ID Panel     Status: None (Preliminary result)   Collection Time: 05/30/16  8:53 AM  Result Value Ref Range Status   Specimen Description BLOOD BLOOD RIGHT HAND  Final   Special Requests BOTTLES DRAWN AEROBIC ONLY 4CC  Final   Culture NO GROWTH 1 DAY  Final   Report Status PENDING  Incomplete  Culture, Urine     Status: None   Collection Time: 05/30/16  9:10 AM  Result Value Ref Range Status   Specimen Description URINE, CLEAN CATCH  Final   Special Requests Normal  Final   Culture NO GROWTH Performed at [facility]   Final   Report Status 05/31/2016 FINAL  Final  Culture, blood (single)     Status: None (Preliminary result)   Collection Time: 05/30/16  7:55 PM  Result Value Ref Range Status   Specimen Description BLOOD LEFT ARM  Final   Special Requests BOTTLES DRAWN AEROBIC ONLY 4CC ONLY  Final   Culture NO GROWTH < 24 HOURS  Final   Report Status PENDING  Incomplete    Radiology Reports US Renal  05/31/2016  CLINICAL DATA:   Urinary tract infection and bacteremia. EXAM: RENAL / URINARY TRACT ULTRASOUND COMPLETE COMPARISON:  None. FINDINGS: Right Kidney: Length: 11.8 cm. Echogenicity within normal limits. No renal mass, abscess or hydronephrosis visualized. No evidence of perinephric fluid. Left Kidney: Length: 12.4 cm. Echogenicity within normal limits. No renal mass, abscess, or hydronephrosis visualized. No evidence of perinephric fluid. Bladder: Appears normal for degree of bladder distention. IMPRESSION: Negative. No evidence of renal parenchymal abnormality or hydronephrosis. Electronically Signed   By: Earle Gell M.D.   On: 05/31/2016 11:43   Dg Chest Port 1 View  06/01/2016  CLINICAL DATA:  Shortness of breath. EXAM: PORTABLE CHEST 1 VIEW COMPARISON:  06/31/2017 . FINDINGS: Mediastinum and hilar structures are normal. Cardiomegaly. Mild left lower lobe and left apical infiltrate. Small left-sided pleural effusion cannot be excluded . IMPRESSION: Mild left upper lobe and left lower lobe infiltrate. Small left pleural effusion. Follow-up chest x-rays to demonstrate clearing suggested. Electronically Signed   By: [place]   On: 06/01/2016 09:25   Dg Chest Port 1 View  05/26/2016  CLINICAL DATA:  Generalized weakness. EXAM: PORTABLE CHEST 1  VIEW COMPARISON:  None. FINDINGS: Low lung volumes. Heart is upper limits normal in size. No confluent opacities or effusions. No acute bony abnormality. IMPRESSION: Low lung volumes.  No active disease. Electronically Signed   By: Rolm Baptise M.D.   On: 05/26/2016 17:10   Ct Head Code Stroke W/o Cm  05/26/2016  CLINICAL DATA:  Parkinson's. Stuttering speech. Difficulty walking. EXAM: CT HEAD WITHOUT CONTRAST TECHNIQUE: Contiguous axial images were obtained from the base of the skull through the vertex without intravenous contrast. COMPARISON:  CT 04/19/2012 FINDINGS: Image quality degraded by motion Ill-defined hypodensity left posterior frontal lobe. This is consistent with  artifact of indeterminate age. This was not present previously. Hypodensity in the left anterior frontal lobe also consistent with ischemia, of indeterminate age. There is chronic ischemia in the left basal ganglia. Generalized atrophy. Mild chronic ischemic change in the right frontal white matter. Negative for hemorrhage or mass.  No shift of the midline structures No skull lesion identified. IMPRESSION: Image quality degraded by motion Left frontal hypodensities could represent acute or chronic ischemia. Correlate with symptoms and MRI. Progressive chronic ischemic changes in the left basal ganglia. These results were called by telephone at the time of interpretation on 05/26/2016 at 4:23 pm to Dr. Ezequiel Essex , who verbally acknowledged these results. Electronically Signed   By: Franchot Gallo M.D.   On: 05/26/2016 16:23     CBC  Recent Labs Lab 05/26/16 1535 05/26/16 1606 05/27/16 0207 05/28/16 0505 05/30/16 0858 05/31/16 0437  WBC 9.8  --  8.9 7.0 7.3 8.5  HGB 12.3 13.3 11.8* 12.4 13.3 10.7*  HCT 37.2 39.0 35.5* 38.3 39.7 31.9*  PLT 91*  --  61* 38* 40* 66*  MCV 101.1*  --  100.9* 101.1* 98.5 97.9  MCH 33.4  --  33.5 32.7 33.0 32.8  MCHC 33.1  --  33.2 32.4 33.5 33.5  RDW 13.3  --  13.5 13.7 13.3 13.3  LYMPHSABS 0.3*  --   --   --   --  1.0  MONOABS 0.7  --   --   --   --  1.3*  EOSABS 0.0  --   --   --   --  0.0  BASOSABS 0.0  --   --   --   --  0.0    Chemistries   Recent Labs Lab 05/26/16 1535 05/26/16 1606 05/27/16 0207 05/28/16 0505 05/30/16 0858 05/31/16 0436  NA 135 139 139 141 140 139  K 3.7 4.0 4.2 4.1 3.9 3.3*  CL 104 101 110 112* 108 108  CO2 19*  --  22 21* 24 25  GLUCOSE 172* 167* 123* 125* 112* 157*  BUN 28* 29* 25* 23* 20 19  CREATININE 1.57* 1.50* 1.05* 0.85 0.73 0.73  CALCIUM 8.3*  --  7.5* 7.9* 8.2* 7.9*  AST 34  --   --   --   --   --   ALT <5*  --   --   --   --   --   ALKPHOS 47  --   --   --   --   --   BILITOT 0.8  --   --   --   --   --     ------------------------------------------------------------------------------------------------------------------ No results for input(s): CHOL, HDL, LDLCALC, TRIG, CHOLHDL, LDLDIRECT in the last 72 hours.  No results found for: HGBA1C ------------------------------------------------------------------------------------------------------------------ No results for input(s): TSH, T4TOTAL, T3FREE, THYROIDAB in the last 72 hours.  Invalid input(s): FREET3 ------------------------------------------------------------------------------------------------------------------ No results for input(s): VITAMINB12, FOLATE, FERRITIN, TIBC, IRON, RETICCTPCT in the last 72 hours.  Coagulation profile  Recent Labs Lab 05/26/16 1630  INR 1.45    No results for input(s): DDIMER in the last 72 hours.  Cardiac Enzymes  Recent Labs Lab 05/26/16 1535  TROPONINI <0.03   ------------------------------------------------------------------------------------------------------------------ No results found for: BNP   Iliyah Bui M.D on 06/01/2016 at 3:22 PM  Between 7am to 7pm - Pager - [phone]  After 7pm go to [email or web address] - password [facility]  [facility] -  Office  [phone]

## 2016-06-01 NOTE — Care Management Note (Signed)
Case Management Note  Patient Details  Name: Carol Mckee MRN: [id number] Date of Birth: 1941/03/14  Expected Discharge Date:    06/04/2016              Expected Discharge Plan:  Home w Home Health Services (vs SNF)  In-House Referral:  NA  Discharge planning Services  CM Consult  Post Acute Care Choice:  Home Health Choice offered to:  Patient  DME Arranged:    DME Agency:     HH Arranged:  RN, PT, OT, Nurse's Aide, Speech Therapy HH Agency:  [facility]  Status of Service:  In process, will continue to follow  If discussed at Long Length of Stay Meetings, dates discussed:  06/01/2016  Additional Comments:  [name], [name], RN 06/01/2016, 1:25 PM

## 2016-06-02 LAB — BASIC METABOLIC PANEL
Anion gap: 6 (ref 5–15)
BUN: 16 mg/dL (ref 6–20)
CHLORIDE: 104 mmol/L (ref 101–111)
CO2: 30 mmol/L (ref 22–32)
Calcium: 7.6 mg/dL — ABNORMAL LOW (ref 8.9–10.3)
Creatinine, Ser: 0.76 mg/dL (ref 0.44–1.00)
GFR calc Af Amer: >60 mL/min (ref >60)
GFR calc non Af Amer: >60 mL/min (ref >60)
GLUCOSE: 119 mg/dL — AB (ref 65–99)
POTASSIUM: 3 mmol/L — AB (ref 3.5–5.1)
Sodium: 140 mmol/L (ref 135–145)

## 2016-06-02 LAB — CBC
HEMATOCRIT: 28.7 % — AB (ref 36.0–46.0)
Hemoglobin: 9.5 g/dL — ABNORMAL LOW (ref 12.0–15.0)
MCH: 32.8 pg (ref 26.0–34.0)
MCHC: 33.1 g/dL (ref 30.0–36.0)
MCV: 99 fL (ref 78.0–100.0)
Platelets: 121 10*3/uL — ABNORMAL LOW (ref 150–400)
RBC: 2.9 MIL/uL — ABNORMAL LOW (ref 3.87–5.11)
RDW: 14 % (ref 11.5–15.5)
WBC: 9.1 10*3/uL (ref 4.0–10.5)

## 2016-06-02 MED ORDER — CEPHALEXIN 500 MG PO CAPS
500.0000 mg | ORAL_CAPSULE | Freq: Three times a day (TID) | ORAL | Status: DC
  Administered 2016-06-02: 500 mg via ORAL
  Filled 2016-06-02: qty 1

## 2016-06-02 MED ORDER — CEPHALEXIN 500 MG PO CAPS: 500.0000 mg | ORAL_CAPSULE | Freq: Three times a day (TID) | ORAL | Status: DC

## 2016-06-02 MED ORDER — POTASSIUM CHLORIDE CRYS ER 20 MEQ PO TBCR
40.0000 meq | EXTENDED_RELEASE_TABLET | Freq: Once | ORAL | Status: AC
  Administered 2016-06-02: 40 meq via ORAL
  Filled 2016-06-02: qty 2

## 2016-06-02 NOTE — Progress Notes (Signed)
Patient discharged home with family, IVs removed and intact, with personal belongings, and prescriptions.

## 2016-06-02 NOTE — Care Management Important Message (Signed)
Important Message  Patient Details  Name: Carol Mckee MRN: [id number] Date of Birth: 21-Apr-1941   Medicare Important Message Given:  Yes    [name], Mallerie Blok Demske, RN 06/02/2016, 12:24 PM

## 2016-06-02 NOTE — Discharge Summary (Addendum)
Physician Discharge Summary  Carol DresserLeola Mckee [id number] DOB: December 12, 1940 DOA: 05/26/2016  PCP: Selinda FlavinHOWARD, KEVIN, MD  Admit date: 05/26/2016 Discharge date: 06/02/2016  Admitted From: home Disposition:  Home with home health  Recommendations for Outpatient Follow-up:  1. Follow up with PCP in 1-2 weeks 2. Please obtain BMP/CBC in one week 3. Repeat CXR in 2-4 weeks to ensure resolution of pneumonia. [facility]. [facility] RN, Aid, supervision, and assistance.  Home Health: YES Equipment/Devices: none  Discharge Condition: improved CODE STATUS: full Diet recommendation: dysphagia 2 with nectar thick liquids  Brief/Interim Summary: Patient presented with complaints of altered mental status and was found to have severe klebsiella sepsis presumably from a urinary source. Upon admission, patient was found to meet sepsis criteria for which she was started on IV Rocehpin and IVFs. Blood and urine cultures were collected and sent for further testing. Blood cultures returned with Klebsiella pneumonia, so case was discussed with infectious disease who recommended continuing IV Rocephin. Repeat urine and blood cultures have been negative. Renal ultrasound was performed to assess whether source of infection was renal related, however, imaging showed no perinephric abscesses or pyelonephritis. CXR indicated left sided infiltrates, which may possibly be related to aspiration since she does have a hx of dysphagia. She has clinically improved with Rocephin, and after discussing with ID, will be transitioned to Keflex to complete her abx course.     Metabolic encephalopathy, most likely secondary to acute infection. CT head from admission showed possible left frontal findings, but was motion degraded. Further interrogation was limited since the patient and her daughter declined a brain MRI. She began to improve clinically with treatment for her sepsis, and now appears more awake and interactive.  Parkinson's,  for which she was treated with Sinemet and Baclofen. She was treated with Sinemet and Baclofen. PT evaluated the patient and recommended home health with PT. She plans to follow with a neurologist in [place].   Thrombocytopenia. Noted to have severe thrombocytopenia, but with no signs of active bleeding. Her low platelet count was suspected to be related to her underlying sepsis. Heparin products were held. With treatment of her sepsis, her platelet count has improved.    Patient also had acute kidney injury related to sepsis. This resolved with administration of IV fluids.  Discharge Diagnoses:  Principal Problem:   Severe sepsis (HCC) Active Problems:   Parkinson's disease (HCC)   Encephalopathy, metabolic   Encephalopathy acute   UTI (urinary tract infection) with Sepsis   Gram negative septicemia (HCC)   Acute encephalopathy Acute kidney Injury  Discharge Instructions      Discharge Instructions    Diet - low sodium heart healthy    Complete by:  As directed      Increase activity slowly    Complete by:  As directed             Medication List    TAKE these medications        baclofen 10 MG tablet  Commonly known as:  LIORESAL  Take 10 mg by mouth daily.     carbidopa-levodopa 25-100 MG tablet  Commonly known as:  SINEMET IR  Take 1.5 tablets by mouth 6 (six) times daily.     CENTRUM SILVER PO  Take 1 tablet by mouth daily.     cephALEXin 500 MG capsule  Commonly known as:  KEFLEX  Take 1 capsule (500 mg total) by mouth 3 (three) times daily.     diazepam 5 MG  tablet  Commonly known as:  VALIUM  Take 0.5 tablets (2.5 mg total) by mouth daily.     DULoxetine 30 MG capsule  Commonly known as:  CYMBALTA  Take 30 mg by mouth daily.     HYDROcodone-acetaminophen 5-325 MG tablet  Commonly known as:  NORCO/VICODIN  Take 0.5-1 tablets by mouth 6 (six) times daily. TAKES ONE TABLET IN THE MORNING AND TAKES ONE-HALF TABLET 5 TIMES DAILY     KRILL OIL PO  Take 1  capsule by mouth daily.     magnesium oxide 400 MG tablet  Commonly known as:  MAG-OX  Take 400 mg by mouth daily.     polyethylene glycol packet  Commonly known as:  MIRALAX / GLYCOLAX  Take 17 g by mouth daily.     Vitamin D3 5000 units Caps  Take 1 capsule by mouth daily.     vitamin E 400 UNIT capsule  Take 400 Units by mouth daily.     VOLTAREN 1 % Gel  Generic drug:  diclofenac sodium  Apply 1 application topically daily as needed (for pain).       Follow-up Information    Follow up with [facility].   Contact information:   [address] [place] [id number] [phone]      Allergies  Allergen Reactions  . Azithromycin Diarrhea  . Lyrica [Pregabalin]     Drops blood pressure  . Tizanidine Other (See Comments)    hypotension  . Erythromycin Diarrhea    Consultations:  PT   Procedures/Studies: [place] Renal  05/31/2016  CLINICAL DATA:  Urinary tract infection and bacteremia. EXAM: RENAL / URINARY TRACT ULTRASOUND COMPLETE COMPARISON:  None. FINDINGS: Right Kidney: Length: 11.8 cm. Echogenicity within normal limits. No renal mass, abscess or hydronephrosis visualized. No evidence of perinephric fluid. Left Kidney: Length: 12.4 cm. Echogenicity within normal limits. No renal mass, abscess, or hydronephrosis visualized. No evidence of perinephric fluid. Bladder: Appears normal for degree of bladder distention. IMPRESSION: Negative. No evidence of renal parenchymal abnormality or hydronephrosis. Electronically Signed   By: Myles RosenthalJohn  Stahl M.D.   On: 05/31/2016 11:43   Dg Chest Port 1 View  06/01/2016  CLINICAL DATA:  Shortness of breath. EXAM: PORTABLE CHEST 1 VIEW COMPARISON:  06/31/2017 . FINDINGS: Mediastinum and hilar structures are normal. Cardiomegaly. Mild left lower lobe and left apical infiltrate. Small left-sided pleural effusion cannot be excluded . IMPRESSION: Mild left upper lobe and left lower lobe infiltrate. Small left pleural  effusion. Follow-up chest x-rays to demonstrate clearing suggested. Electronically Signed   By: Maisie Fushomas  Register   On: 06/01/2016 09:25   Dg Chest Port 1 View  05/26/2016  CLINICAL DATA:  Generalized weakness. EXAM: PORTABLE CHEST 1 VIEW COMPARISON:  None. FINDINGS: Low lung volumes. Heart is upper limits normal in size. No confluent opacities or effusions. No acute bony abnormality. IMPRESSION: Low lung volumes.  No active disease. Electronically Signed   By: Charlett NoseKevin  Dover M.D.   On: 05/26/2016 17:10   Ct Head Code Stroke W/o Cm  05/26/2016  CLINICAL DATA:  Parkinson's. Stuttering speech. Difficulty walking. EXAM: CT HEAD WITHOUT CONTRAST TECHNIQUE: Contiguous axial images were obtained from the base of the skull through the vertex without intravenous contrast. COMPARISON:  CT 04/19/2012 FINDINGS: Image quality degraded by motion Ill-defined hypodensity left posterior frontal lobe. This is consistent with artifact of indeterminate age. This was not present previously. Hypodensity in the left anterior frontal lobe also consistent with ischemia, of indeterminate  age. There is chronic ischemia in the left basal ganglia. Generalized atrophy. Mild chronic ischemic change in the right frontal white matter. Negative for hemorrhage or mass.  No shift of the midline structures No skull lesion identified. IMPRESSION: Image quality degraded by motion Left frontal hypodensities could represent acute or chronic ischemia. Correlate with symptoms and MRI. Progressive chronic ischemic changes in the left basal ganglia. These results were called by telephone at the time of interpretation on 05/26/2016 at 4:23 pm to Dr. Glynn Octave , who verbally acknowledged these results. Electronically Signed   By: Marlan Palau M.D.   On: 05/26/2016 16:23     Subjective: Feel improved today. No new complaints  Discharge Exam: Filed Vitals:   06/01/16 2300 06/02/16 0631  BP: 147/56 126/47  Pulse: 89 71  Temp: 98 F (36.7 C)  98.4 F (36.9 C)  Resp: 20 18   Filed Vitals:   06/01/16 1420 06/01/16 2240 06/01/16 2300 06/02/16 0631  BP:   147/56 126/47  Pulse:  85 89 71  Temp:   98 F (36.7 C) 98.4 F (36.9 C)  TempSrc:   Oral Oral  Resp:  18 20 18   Height:      Weight:      SpO2: 100% 99% 93% 90%   Examination:  General exam: Appears calm and comfortable  Respiratory system: Clear to auscultation. Respiratory effort normal. Cardiovascular system: S1 & S2 heard, RRR. No JVD, murmurs, rubs, gallops or clicks. Trace edema bilaterally. Gastrointestinal system: Abdomen is nondistended, soft and nontender. No organomegaly or masses felt. Normal bowel sounds heard. Central nervous system: Alert and oriented. No focal neurological deficits. Extremities: Symmetric 5 x 5 power. Skin: No rashes, lesions or ulcers Psychiatry: Judgement and insight appear normal. Mood & affect appropriate.   The results of significant diagnostics from this hospitalization (including imaging, microbiology, ancillary and laboratory) are listed below for reference.     Microbiology: Recent Results (from the past 240 hour(s))  Urine culture     Status: Abnormal   Collection Time: 05/26/16  5:02 PM  Result Value Ref Range Status   Specimen Description URINE, CLEAN CATCH  Final   Special Requests NONE  Final   Culture MULTIPLE SPECIES PRESENT, SUGGEST RECOLLECTION (A)  Final   Report Status 05/28/2016 FINAL  Final  Blood culture (routine x 2)     Status: Abnormal   Collection Time: 05/26/16  5:50 PM  Result Value Ref Range Status   Specimen Description BLOOD LEFT ARM  Final   Special Requests BOTTLES DRAWN AEROBIC AND ANAEROBIC 6CC EACH  Final   Culture  Setup Time   Final    GRAM NEGATIVE RODS RECOVERED FROM BOTH BOTTLES Gram Stain Report Called to,Read Back By and Verified With: DICKERSON,M. AT 1059 ON 05/27/2016 BY BAUGHAM,M. Performed at [facility] CRITICAL RESULT CALLED TO, READ BACK BY AND VERIFIED WITH: L. SEAY,  RPH AT 1429 ON 05/27/16 BY C. JESSUP, MLT. Performed at [facility]    Culture KLEBSIELLA PNEUMONIAE (A)  Final   Report Status 05/29/2016 FINAL  Final   Organism ID, Bacteria KLEBSIELLA PNEUMONIAE  Final      Susceptibility   Klebsiella pneumoniae - MIC*    AMPICILLIN >=32 RESISTANT Resistant     CEFAZOLIN <=4 SENSITIVE Sensitive     CEFEPIME <=1 SENSITIVE Sensitive     CEFTAZIDIME <=1 SENSITIVE Sensitive     CEFTRIAXONE <=1 SENSITIVE Sensitive     CIPROFLOXACIN <=0.25 SENSITIVE Sensitive  GENTAMICIN <=1 SENSITIVE Sensitive     IMIPENEM <=0.25 SENSITIVE Sensitive     TRIMETH/SULFA <=20 SENSITIVE Sensitive     AMPICILLIN/SULBACTAM >=32 RESISTANT Resistant     PIP/TAZO 32 INTERMEDIATE Intermediate     * KLEBSIELLA PNEUMONIAE  Blood Culture ID Panel (Reflexed)     Status: Abnormal   Collection Time: 05/26/16  5:50 PM  Result Value Ref Range Status   Enterococcus species NOT DETECTED NOT DETECTED Final   Vancomycin resistance NOT DETECTED NOT DETECTED Final   Listeria monocytogenes NOT DETECTED NOT DETECTED Final   Staphylococcus species NOT DETECTED NOT DETECTED Final   Staphylococcus aureus NOT DETECTED NOT DETECTED Final   Methicillin resistance NOT DETECTED NOT DETECTED Final   Streptococcus species NOT DETECTED NOT DETECTED Final   Streptococcus agalactiae NOT DETECTED NOT DETECTED Final   Streptococcus pneumoniae NOT DETECTED NOT DETECTED Final   Streptococcus pyogenes NOT DETECTED NOT DETECTED Final   Acinetobacter baumannii NOT DETECTED NOT DETECTED Final   Enterobacteriaceae species DETECTED (A) NOT DETECTED Final    Comment: CRITICAL RESULT CALLED TO, READ BACK BY AND VERIFIED WITH: L. SEAY, RPH AT 1420 ON 05/27/16 BY C. JESSUP, MLT.    Enterobacter cloacae complex NOT DETECTED NOT DETECTED Final   Escherichia coli NOT DETECTED NOT DETECTED Final   Klebsiella oxytoca NOT DETECTED NOT DETECTED Final   Klebsiella pneumoniae DETECTED (A) NOT DETECTED Final     Comment: CRITICAL RESULT CALLED TO, READ BACK BY AND VERIFIED WITH: L. SEAY, RPH AT 1420 ON 05/27/16 BY C. JESSUP, MLT.    Proteus species NOT DETECTED NOT DETECTED Final   Serratia marcescens NOT DETECTED NOT DETECTED Final   Carbapenem resistance NOT DETECTED NOT DETECTED Final   Haemophilus influenzae NOT DETECTED NOT DETECTED Final   Neisseria meningitidis NOT DETECTED NOT DETECTED Final   Pseudomonas aeruginosa NOT DETECTED NOT DETECTED Final   Candida albicans NOT DETECTED NOT DETECTED Final   Candida glabrata NOT DETECTED NOT DETECTED Final   Candida krusei NOT DETECTED NOT DETECTED Final   Candida parapsilosis NOT DETECTED NOT DETECTED Final   Candida tropicalis NOT DETECTED NOT DETECTED Final    Comment: Performed at [facility]  Blood culture (routine x 2)     Status: Abnormal   Collection Time: 05/26/16  5:56 PM  Result Value Ref Range Status   Specimen Description BLOOD LEFT HAND  Final   Special Requests   Final    BOTTLES DRAWN AEROBIC AND ANAEROBIC AEB=6CC ANA=4CC   Culture  Setup Time   Final    GRAM NEGATIVE RODS RECOVERED FROM BOTH BOTTLES Gram Stain Report Called to,Read Back By and Verified With: DICKERSON,M. AT 1059 ON 05/27/2016 BY BAUGHAM,M. Performed at [facility]    Culture (A)  Final    KLEBSIELLA PNEUMONIAE SUSCEPTIBILITIES PERFORMED ON PREVIOUS CULTURE WITHIN THE LAST 5 DAYS. Performed at [facility]    Report Status 05/29/2016 FINAL  Final  Culture, blood (Routine X 2) w Reflex to ID Panel     Status: None (Preliminary result)   Collection Time: 05/30/16  8:53 AM  Result Value Ref Range Status   Specimen Description BLOOD RIGHT HAND  Final   Special Requests BOTTLES DRAWN AEROBIC ONLY 4CC  Final   Culture NO GROWTH 3 DAYS  Final   Report Status PENDING  Incomplete  Culture, Urine     Status: None   Collection Time: 05/30/16  9:10 AM  Result Value Ref Range Status   Specimen Description  URINE, CLEAN CATCH  Final   Special  Requests Normal  Final   Culture NO GROWTH Performed at [facility]   Final   Report Status 05/31/2016 FINAL  Final  Culture, blood (single)     Status: None (Preliminary result)   Collection Time: 05/30/16  7:55 PM  Result Value Ref Range Status   Specimen Description BLOOD LEFT ARM  Final   Special Requests BOTTLES DRAWN AEROBIC ONLY 4CC ONLY  Final   Culture NO GROWTH 3 DAYS  Final   Report Status PENDING  Incomplete     Labs: BNP (last 3 results) No results for input(s): BNP in the last 8760 hours. Basic Metabolic Panel:  Recent Labs Lab 05/27/16 0207 05/28/16 0505 05/30/16 0858 05/31/16 0436 06/02/16 0513  NA 139 141 140 139 140  K 4.2 4.1 3.9 3.3* 3.0*  CL 110 112* 108 108 104  CO2 22 21* 24 25 30   GLUCOSE 123* 125* 112* 157* 119*  BUN 25* 23* 20 19 16   CREATININE 1.05* 0.85 0.73 0.73 0.76  CALCIUM 7.5* 7.9* 8.2* 7.9* 7.6*   Liver Function Tests:  Recent Labs Lab 05/26/16 1535  AST 34  ALT <5*  ALKPHOS 47  BILITOT 0.8  PROT 6.1*  ALBUMIN 3.4*   CBC:  Recent Labs Lab 05/26/16 1535  05/27/16 0207 05/28/16 0505 05/30/16 0858 05/31/16 0437 06/02/16 0513  WBC 9.8  --  8.9 7.0 7.3 8.5 9.1  NEUTROABS 8.8*  --   --   --   --  6.1  --   HGB 12.3  < > 11.8* 12.4 13.3 10.7* 9.5*  HCT 37.2  < > 35.5* 38.3 39.7 31.9* 28.7*  MCV 101.1*  --  100.9* 101.1* 98.5 97.9 99.0  PLT 91*  --  61* 38* 40* 66* 121*  < > = values in this interval not displayed. Cardiac Enzymes:  Recent Labs Lab 05/26/16 1535 05/26/16 1630  CKTOTAL  --  46  TROPONINI <0.03  --    Urinalysis    Component Value Date/Time   COLORURINE YELLOW 05/26/2016 1702   APPEARANCEUR HAZY* 05/26/2016 1702   LABSPEC 1.025 05/26/2016 1702   PHURINE 5.5 05/26/2016 1702   GLUCOSEU NEGATIVE 05/26/2016 1702   HGBUR LARGE* 05/26/2016 1702   BILIRUBINUR NEGATIVE 05/26/2016 1702   KETONESUR TRACE* 05/26/2016 1702   PROTEINUR 100* 05/26/2016 1702   UROBILINOGEN 0.2 04/19/2012 1532    NITRITE POSITIVE* 05/26/2016 1702   LEUKOCYTESUR MODERATE* 05/26/2016 1702   Sepsis Labs Invalid input(s): PROCALCITONIN,  WBC,  LACTICIDVEN Microbiology Recent Results (from the past 240 hour(s))  Urine culture     Status: Abnormal   Collection Time: 05/26/16  5:02 PM  Result Value Ref Range Status   Specimen Description URINE, CLEAN CATCH  Final   Special Requests NONE  Final   Culture MULTIPLE SPECIES PRESENT, SUGGEST RECOLLECTION (A)  Final   Report Status 05/28/2016 FINAL  Final  Blood culture (routine x 2)     Status: Abnormal   Collection Time: 05/26/16  5:50 PM  Result Value Ref Range Status   Specimen Description BLOOD LEFT ARM  Final   Special Requests BOTTLES DRAWN AEROBIC AND ANAEROBIC 6CC EACH  Final   Culture  Setup Time   Final    GRAM NEGATIVE RODS RECOVERED FROM BOTH BOTTLES Gram Stain Report Called to,Read Back By and Verified With: DICKERSON,M. AT 1059 ON 05/27/2016 BY BAUGHAM,M. Performed at [facility] CRITICAL RESULT CALLED TO, READ BACK BY AND  VERIFIED WITH: L. SEAY, RPH AT 1429 ON 05/27/16 BY C. JESSUP, MLT. Performed at [facility]    Culture KLEBSIELLA PNEUMONIAE (A)  Final   Report Status 05/29/2016 FINAL  Final   Organism ID, Bacteria KLEBSIELLA PNEUMONIAE  Final      Susceptibility   Klebsiella pneumoniae - MIC*    AMPICILLIN >=32 RESISTANT Resistant     CEFAZOLIN <=4 SENSITIVE Sensitive     CEFEPIME <=1 SENSITIVE Sensitive     CEFTAZIDIME <=1 SENSITIVE Sensitive     CEFTRIAXONE <=1 SENSITIVE Sensitive     CIPROFLOXACIN <=0.25 SENSITIVE Sensitive     GENTAMICIN <=1 SENSITIVE Sensitive     IMIPENEM <=0.25 SENSITIVE Sensitive     TRIMETH/SULFA <=20 SENSITIVE Sensitive     AMPICILLIN/SULBACTAM >=32 RESISTANT Resistant     PIP/TAZO 32 INTERMEDIATE Intermediate     * KLEBSIELLA PNEUMONIAE  Blood Culture ID Panel (Reflexed)     Status: Abnormal   Collection Time: 05/26/16  5:50 PM  Result Value Ref Range Status   Enterococcus species  NOT DETECTED NOT DETECTED Final   Vancomycin resistance NOT DETECTED NOT DETECTED Final   Listeria monocytogenes NOT DETECTED NOT DETECTED Final   Staphylococcus species NOT DETECTED NOT DETECTED Final   Staphylococcus aureus NOT DETECTED NOT DETECTED Final   Methicillin resistance NOT DETECTED NOT DETECTED Final   Streptococcus species NOT DETECTED NOT DETECTED Final   Streptococcus agalactiae NOT DETECTED NOT DETECTED Final   Streptococcus pneumoniae NOT DETECTED NOT DETECTED Final   Streptococcus pyogenes NOT DETECTED NOT DETECTED Final   Acinetobacter baumannii NOT DETECTED NOT DETECTED Final   Enterobacteriaceae species DETECTED (A) NOT DETECTED Final    Comment: CRITICAL RESULT CALLED TO, READ BACK BY AND VERIFIED WITH: L. SEAY, RPH AT 1420 ON 05/27/16 BY C. JESSUP, MLT.    Enterobacter cloacae complex NOT DETECTED NOT DETECTED Final   Escherichia coli NOT DETECTED NOT DETECTED Final   Klebsiella oxytoca NOT DETECTED NOT DETECTED Final   Klebsiella pneumoniae DETECTED (A) NOT DETECTED Final    Comment: CRITICAL RESULT CALLED TO, READ BACK BY AND VERIFIED WITH: L. SEAY, RPH AT 1420 ON 05/27/16 BY C. JESSUP, MLT.    Proteus species NOT DETECTED NOT DETECTED Final   Serratia marcescens NOT DETECTED NOT DETECTED Final   Carbapenem resistance NOT DETECTED NOT DETECTED Final   Haemophilus influenzae NOT DETECTED NOT DETECTED Final   Neisseria meningitidis NOT DETECTED NOT DETECTED Final   Pseudomonas aeruginosa NOT DETECTED NOT DETECTED Final   Candida albicans NOT DETECTED NOT DETECTED Final   Candida glabrata NOT DETECTED NOT DETECTED Final   Candida krusei NOT DETECTED NOT DETECTED Final   Candida parapsilosis NOT DETECTED NOT DETECTED Final   Candida tropicalis NOT DETECTED NOT DETECTED Final    Comment: Performed at [facility]  Blood culture (routine x 2)     Status: Abnormal   Collection Time: 05/26/16  5:56 PM  Result Value Ref Range Status   Specimen Description  BLOOD LEFT HAND  Final   Special Requests   Final    BOTTLES DRAWN AEROBIC AND ANAEROBIC AEB=6CC ANA=4CC   Culture  Setup Time   Final    GRAM NEGATIVE RODS RECOVERED FROM BOTH BOTTLES Gram Stain Report Called to,Read Back By and Verified With: DICKERSON,M. AT 1059 ON 05/27/2016 BY BAUGHAM,M. Performed at [facility]    Culture (A)  Final    KLEBSIELLA PNEUMONIAE SUSCEPTIBILITIES PERFORMED ON PREVIOUS CULTURE WITHIN THE LAST 5 DAYS. Performed at  [facility]    Report Status 05/29/2016 FINAL  Final  Culture, blood (Routine X 2) w Reflex to ID Panel     Status: None (Preliminary result)   Collection Time: 05/30/16  8:53 AM  Result Value Ref Range Status   Specimen Description BLOOD RIGHT HAND  Final   Special Requests BOTTLES DRAWN AEROBIC ONLY 4CC  Final   Culture NO GROWTH 3 DAYS  Final   Report Status PENDING  Incomplete  Culture, Urine     Status: None   Collection Time: 05/30/16  9:10 AM  Result Value Ref Range Status   Specimen Description URINE, CLEAN CATCH  Final   Special Requests Normal  Final   Culture NO GROWTH Performed at [facility]   Final   Report Status 05/31/2016 FINAL  Final  Culture, blood (single)     Status: None (Preliminary result)   Collection Time: 05/30/16  7:55 PM  Result Value Ref Range Status   Specimen Description BLOOD LEFT ARM  Final   Special Requests BOTTLES DRAWN AEROBIC ONLY 4CC ONLY  Final   Culture NO GROWTH 3 DAYS  Final   Report Status PENDING  Incomplete     Time coordinating discharge: Over 30 minutes  SIGNED:  Erick Blinks,  MD  [facility] 06/02/2016, 2:46 PM Pager [phone]  If 7PM-7AM, please contact night-coverage [email or web address] Password [email or web address]  By signing my name below, I, Adron Bene, attest that this documentation has been prepared under the direction and in the presence of Erick Blinks, MD. Electronically Signed: Adron Bene 06/02/2016 2:15pm  I, Dr. Erick Blinks,  personally performed the services described in this documentaiton. All medical record entries made by the scribe were at my direction and in my presence. I have reviewed the chart and agree that the record reflects my personal performance and is accurate and complete  Erick Blinks, MD, 06/02/2016 2:46 PM

## 2016-06-02 NOTE — Care Management Note (Addendum)
Case Management Note  Patient Details  Name: Carol DresserLeola Mckee MRN: [id number] Date of Birth: 08-02-1941  Expected Discharge Date:      06/02/2016            Expected Discharge Plan:  Home w Home Health Services (vs SNF)  In-House Referral:  NA  Discharge planning Services  CM Consult  Post Acute Care Choice:  Home Health Choice offered to:  Patient  DME Arranged:    DME Agency:     HH Arranged:  RN, PT, OT, Nurse's Aide, Speech Therapy HH Agency:  [facility]  Status of Service:  Completed, signed off  If discussed at Long Length of Stay Meetings, dates discussed:    Additional Comments: [facility] Husband as bedside. Pt/husband understand [facility] has 48 hours to initiate services. Alroy BailiffLinda Lothian, of [facility], made aware of DC and will obtain pt info from chart. Pt's husband plans to transport pt via wheelchair Zenaida Niecevan himself.   [name], Markale Birdsell Demske, RN 06/02/2016, 12:24 PM

## 2016-06-02 NOTE — Progress Notes (Signed)
Physical Therapy Treatment Patient Details Name: Carol DresserLeola Burnstein MRN: [id number] DOB: 03/16/1941 Today's Date: 06/02/2016    History of Present Illness 75 yo F admitted with AMS and speech problems. Dx: Severe sepsis from urinary source, metabolic encephalopathy. Head CT shows L frontal hypodensities which could represent acute or chronic ischemia. Awaiting MRI. PMH: Parkinson's, paralysis agitans, stiff shoulder, gait abnormality, spinal stenosis.    PT Comments    Pt received in bed, much more alert today, multiple family/friends present, and pt is agreeable to PT tx.  Pt required tactile cues to perform exercises in reciprocal pattern.  Exercises performed prior to gait due for increased carry over with reciprocal gait pattern.  Pt was able to ambulate 774ft with Mod A and RW.  She continues to demonstrate posterior lean, and therefore PT required to place pressure down through RW.  She continues to require 24/7 supervision/assistance, and would benefit from HHPT.   Follow Up Recommendations  Home health PT;Supervision/Assistance - 24 hour     Equipment Recommendations  None recommended by PT    Recommendations for Other Services       Precautions / Restrictions Precautions Precautions: Fall Precaution Comments: Pt reports she had a fall last december when she was in the bathroom.  Restrictions Weight Bearing Restrictions: No    Mobility  Bed Mobility Overal bed mobility: Needs Assistance Bed Mobility: Supine to Sit     Supine to sit: Mod assist;HOB elevated        Transfers Overall transfer level: Needs assistance Equipment used: Rolling walker (2 wheeled) Transfers: Sit to/from Stand Sit to Stand: Mod assist;From elevated surface;+2 safety/equipment Stand pivot transfers: Mod assist       General transfer comment: Pt requires incraesed time for all functional mobilities.   Ambulation/Gait Ambulation/Gait assistance: Mod assist Ambulation Distance (Feet): 4  Feet Assistive device: Rolling walker (2 wheeled) Gait Pattern/deviations: Step-to pattern;Shuffle;Trunk flexed     General Gait Details: Pt demonstrated improved weight shifting today, but has a posterior lean, and has a tendancy to pull RW backwards.  PT applied pressure down through the RW for pt to gain her balance.    Stairs            Wheelchair Mobility    Modified Rankin (Stroke Patients Only)       Balance Overall balance assessment: Needs assistance Sitting-balance support: Bilateral upper extremity supported Sitting balance-Leahy Scale: Fair     Standing balance support: Bilateral upper extremity supported Standing balance-Leahy Scale: Fair Standing balance comment: posterior lean with fwd trunk flexion.                     Cognition Arousal/Alertness: Awake/alert Behavior During Therapy: WFL for tasks assessed/performed Overall Cognitive Status: History of cognitive impairments - at baseline                      Exercises General Exercises - Lower Extremity Ankle Circles/Pumps: AROM;Both;10 reps;Supine;Limitations Ankle Circles/Pumps Limitations: tc's required for reciprocal pattern.  Heel Slides: AROM;Both;10 reps;Supine;Limitations Heel Slides Limitations: tc's required for reciprocal pattern.  Hip Flexion/Marching: AROM;Both;10 reps;Supine Hip Flexion/Marching Limitations: tc's required for reciprocal pattern.  Other Exercises Other Exercises: Lumbar rotation in hooklying x 10 reps with Min A due to pt loosing focus and talking to visitors in the room about random topics.     General Comments        Pertinent Vitals/Pain Pain Assessment: No/denies pain    Home Living  Prior Function            PT Goals (current goals can now be found in the care plan section) Acute Rehab PT Goals Patient Stated Goal: Pt wants to go home. PT Goal Formulation: With patient/family Time For Goal Achievement:  06/04/16 Potential to Achieve Goals: Good Progress towards PT goals: Progressing toward goals    Frequency  Min 5X/week    PT Plan Current plan remains appropriate    Co-evaluation             End of Session Equipment Utilized During Treatment: Gait belt Activity Tolerance: Patient tolerated treatment well;No increased pain Patient left: in chair;with family/visitor present     Time: [id number] PT Time Calculation (min) (ACUTE ONLY): 23 min  Charges:  $Gait Training: 8-22 mins $Therapeutic Exercise: 8-22 mins                    G Codes:      Beth Luara Faye, PT, DPT X: [phone]

## 2016-06-04 ENCOUNTER — Other Ambulatory Visit (HOSPITAL_COMMUNITY)
Admission: RE | Admit: 2016-06-04 | Discharge: 2016-06-04 | Disposition: A | Discharge status: Home / Self Care | Payer: Medicare Other | Source: Other Acute Inpatient Hospital | Attending: Family Medicine | Admitting: Family Medicine

## 2016-06-04 DIAGNOSIS — A4189 Other specified sepsis: Secondary | ICD-10-CM | POA: Diagnosis present

## 2016-06-04 LAB — COMPREHENSIVE METABOLIC PANEL
ALBUMIN: 2.4 g/dL — AB (ref 3.5–5.0)
ALT: 6 U/L — ABNORMAL LOW (ref 14–54)
ANION GAP: 6 (ref 5–15)
AST: 25 U/L (ref 15–41)
Alkaline Phosphatase: 71 U/L (ref 38–126)
BILIRUBIN TOTAL: 0.5 mg/dL (ref 0.3–1.2)
BUN: 15 mg/dL (ref 6–20)
CO2: 31 mmol/L (ref 22–32)
Calcium: 7.9 mg/dL — ABNORMAL LOW (ref 8.9–10.3)
Chloride: 103 mmol/L (ref 101–111)
Creatinine, Ser: 0.67 mg/dL (ref 0.44–1.00)
GFR calc Af Amer: >60 mL/min (ref >60)
GFR calc non Af Amer: >60 mL/min (ref >60)
GLUCOSE: 111 mg/dL — AB (ref 65–99)
POTASSIUM: 3.6 mmol/L (ref 3.5–5.1)
Sodium: 140 mmol/L (ref 135–145)
TOTAL PROTEIN: 6.1 g/dL — AB (ref 6.5–8.1)

## 2016-06-04 LAB — CULTURE, BLOOD (SINGLE): CULTURE: NO GROWTH

## 2016-06-04 LAB — CBC
HEMATOCRIT: 30.4 % — AB (ref 36.0–46.0)
Hemoglobin: 9.6 g/dL — ABNORMAL LOW (ref 12.0–15.0)
MCH: 31.7 pg (ref 26.0–34.0)
MCHC: 31.6 g/dL (ref 30.0–36.0)
MCV: 100.3 fL — ABNORMAL HIGH (ref 78.0–100.0)
PLATELETS: 188 10*3/uL (ref 150–400)
RBC: 3.03 MIL/uL — ABNORMAL LOW (ref 3.87–5.11)
RDW: 13.8 % (ref 11.5–15.5)
WBC: 8.4 10*3/uL (ref 4.0–10.5)

## 2016-06-04 LAB — CULTURE, BLOOD (ROUTINE X 2): Culture: NO GROWTH

## 2016-06-05 DIAGNOSIS — N12 Tubulo-interstitial nephritis, not specified as acute or chronic: Secondary | ICD-10-CM

## 2016-06-05 HISTORY — DX: Tubulo-interstitial nephritis, not specified as acute or chronic: N12

## 2016-06-28 ENCOUNTER — Ambulatory Visit (INDEPENDENT_AMBULATORY_CARE_PROVIDER_SITE_OTHER): Payer: Medicare Other | Admitting: Neurology

## 2016-06-28 ENCOUNTER — Encounter: Payer: Self-pay | Admitting: Neurology

## 2016-06-28 VITALS — BP 130/68 | HR 78 | Resp 16

## 2016-06-28 DIAGNOSIS — G20 Parkinson's disease: Secondary | ICD-10-CM

## 2016-06-28 DIAGNOSIS — M545 Low back pain: Secondary | ICD-10-CM

## 2016-06-28 DIAGNOSIS — Z87898 Personal history of other specified conditions: Secondary | ICD-10-CM

## 2016-06-28 DIAGNOSIS — G4733 Obstructive sleep apnea (adult) (pediatric): Secondary | ICD-10-CM | POA: Diagnosis not present

## 2016-06-28 DIAGNOSIS — Z9989 Dependence on other enabling machines and devices: Secondary | ICD-10-CM

## 2016-06-28 DIAGNOSIS — Z9289 Personal history of other medical treatment: Secondary | ICD-10-CM

## 2016-06-28 DIAGNOSIS — G8929 Other chronic pain: Secondary | ICD-10-CM

## 2016-06-28 NOTE — Patient Instructions (Signed)
We will continue with your medications.  Follow up in 4 months.  Drink plenty of water.

## 2016-06-28 NOTE — Progress Notes (Signed)
Subjective:    Patient ID: Carol Mckee is a 75 y.o. female.  HPI     Interim history:   [name] is a very pleasant 75 year old ambidextrous female with an underlying medical history of lumbar spinal stenosis, hip pain, status post hiatal hernia surgery, kidney stone surgery, bladder surgery, hysterectomy, breast biopsy, tonsillectomy and adenoidectomy, who presents for followup consultation of her advanced, right-sided predominant Parkinson's disease, complicated by memory loss, LBP, bladder incontinence and gait disorder and OSA, on CPAP. She is accompanied by her husband today. I last saw her on 02/25/2016, at which time she was compliant with CPAP therapy. She reported pain while turning in bed, nocturia, no recent UTI. She sees a Dealer in [place]. Memory was stable. She had no recent fall. She needed new CPAP supplies. Her MMSE was 29/30, CDT: 4/4, AFT: 10/min at the time. I suggested we try to increase Sinemet to 1-1/2 pills 6 times a day, every 3 hours, starting at 5 AM.  Today, 06/28/2016: I reviewed her CPAP compliance data from 05/25/2016 through 06/23/2016 which is a total of 30 days during which time she used her machine 23 days with percent used days greater than 4 hours at 63%, indicating suboptimal compliance because of a gap of one week in June. Average usage of 5 hours and 8 minutes, residual AHI borderline at 6.7, leak at times high with the 95th percentile at 29.2 L/m at a pressure of 15 cm with EPR of 3.  Today, 06/28/2016: She reports ongoing back pain, take hydrocodone, 1 pill in AM and HS and 1/2 pill qid in between, has a new Pain managment doctor, Dr. Myles Rosenthal, with appointment pending in August. Unfortunately, she had an interim hospitalization secondary to altered mental status in the context of urinary tract infection. I reviewed the hospital records. She was admitted on 05/26/2016 and discharged on 06/02/2016, was found to have urosepsis, treated with anti-biotics  IV. She had a head CT without contrast upon admission on 05/26/2016 and I reviewed the findings: IMPRESSION: Image quality degraded by motion  Left frontal hypodensities could represent acute or chronic ischemia. Correlate with symptoms and MRI. Progressive chronic ischemic changes in the left basal ganglia.  Patient and her family declined an MRI brain as I understand.  Finished HH therapy last week. PCP appointment.   Previously:   I saw her on 08/28/2015, at which time she reported ongoing issues with low back pain. She also had right shoulder pain. She had severe limitation in her range of motion. She was using a 2 wheeled walker. She was sleeping and in hospital bed. In the early morning she would shift into a lift chair. She would have to get up multiple times at night for bathroom breaks, up to 6 times per night, her husband was having more health issues himself of the past 1 year. She was trying to be compliant with CPAP therapy but AHI and leak were both high. I suggested she continue with Sinemet at the current dose, 1-1/2 pills 5 times a day.  I reviewed her CPAP compliance data from 01/25/2016 through 02/23/2016 which is a total of 30 days during which time she used her machine every night with 100% compliance, average usage of 9 hours and 43 minutes, residual AHI 26.7 per hour, leaked high at 49.8 L/m for the 95th percentile on a pressure of 14 cm with EPR of 3. Residual apneas appeared to be obstructive in nature.  I saw her on 02/25/2015, at which  time she reported that she was continuing to struggle with the CPAP mask. She had trouble closing her mouth. She was using a fullface mask with a chinstrap. Unfortunately, she had fallen from her recliner in December. She had been in her lift chair and fell asleep and accidentally pushed the control button. She fell forward onto her face. She did not have any LOC or residual headaches. She was on 7 1/2 pills of Sinemet daily. She was having  ongoing issues with low back pain. In the past she had injection treatment with variable results, under Dr. Francesco Runner for this. She was on hydrocodone 3 times a day. She had nocturia multiple times a night which was very bothersome to her. She felt her memory and mood were stable.   I reviewed her CPAP compliance data from 07/27/2015 through 08/25/2015 which is a total of 30 days during which time she used her machine every night, with percent used days greater than 4 hours at 100%, indicating superb compliance with an average usage of 10 hours and 27 minutes, residual AHI high at 27.1 per hour, leak I with the 95th percentile at 54.5 L/m on a pressure of 14 cm with EPR of 3. Looking at her AHI breakdown, it looks like her residual sleep disordered breathing is primarily obstructive in nature. She was supposed to switch to AutoPap therapy but appears to be still on the same setting of 14 cm.  I saw her on 08/26/2014, at which time she had some residual snoring according to her husband. She was using a fullface mask. Parkinson's-wise, she felt stable. I changed her from AutoPap to a set pressure of 14 cm.   I reviewed her CPAP compliance data from 01/25/2015 through 02/23/2015 which is a total of 30 days during which time she used her machine every night with percent used days greater than 4 hours at 100%, indicating superb compliance on her behalf with an average usage of 9 hours and 34 minutes and pressure has now been changed to 14 cm with EPR of 3 but AHI was suboptimal at 14.4, probably due to very high leak with the 95th percentile of leak at 78.6 L/m.   I saw her on 05/02/2014, at which time I talked her about her recent sleep study results and her CPAP treatment. She had done well with that. I changed her from AutoPap to CPAP of 14 cm. She changed from a nasal mask to a full face mask. I asked her to not drive anymore. I asked her to continue with her Parkinson's medications. I changed the timing to 6,  10, 2, 6 PM and 10 PM and I encouraged her to continue using CPAP regularly.  I reviewed her compliance data from 07/24/2014 through 08/22/2014 which is a total of 30 days during which time she was 100% compliant. Her residual AHI was unfortunately high at 23.5. 95th percentile pressure was 12.8 but leak was very high at times. She is supposed to be on CPAP but is still on AutoPap from 8-13 with EPR of 3.  I saw her on 02/21/2014, at which time we talked about her signs and symptoms concerning for sleep apnea and I suggested a sleep study. I also advised her to try to reduce sedating medications including her Valium as well as her hydrocodone. She was advised to use her walker at all times. She felt that physical therapy was helpful. She was asking if she could drive. She had been to the [facility] office,  and was discouraged from driving. She sees Dr. Francesco Runner in [place], [place] for pain. She had been taking norco 1 pill tid and 1/2 pill bid, alternating. She is taking C/L 1 1/2 pills 5 times a day.   She has sleep study on 02/27/2014 and went over her test results with her in detail today. Her baseline sleep efficiency was 100% with 100% of stage II sleep only. She had no significant EKG changes or periodic leg movements of sleep. Her total AHI was high at 54.5 per hour. Baseline oxygen saturation was 94%, nadir was 86%. She was titrated on CPAP from 5-8 centimeters of water pressure. She was noted to have central apneas. She was noted to have mouth opening. On a pressure of 8 cm a residual AHI was 6.5 per hour. She had no REM sleep. I placed the patient on CPAP of 9 cm. 03/20/2014 to 04/08/2014, which is a total of 21 days, during which time the patient used CPAP every day. The average usage for all days was 10 hours and 10 minutes. The percent used days greater than 4 hours was 100 %, indicating superb compliance. The residual AHI was unfortunately high at 22.4 per hour, indicating an inadequate treatment pressure of 9  cwp with EPR of 3. Her residual AHI is primarily obstructive in nature per AHI breakdown. Air leak from the mask was also elevated with 38.1 L per minute at the 95th percentile. Earlier this month based on these results I placed her on AutoPap as opposed to CPAP.   I reviewed the patient's PAP compliance data from 04/16/2014 to 05/01/2014, which is a total of 16 days, during which time the patient used CPAP every day. The average usage for all days was 10 hours and 12 minutes. The percent used days greater than 4 hours was 100 %, indicating superb compliance. The residual AHI was unfortunately elevated at 14.9 per hour. The pressure setting is from 8-13 cm. 95th percentile pressure was 12.9. Her leak was at times high.   I saw her on 10/04/2013, at which time I did not make changes to her medications but made a referral to physical therapy and she wanted to stay locally in [place], [place], for this. We talked about potentially starting her on Exelon for memory loss.   I first met her on 05/01/2013, which time I suggested that she increase her Sinemet to 1-1/2 pills 5 times a day. I also reinforced the importance of using her walker at all times as she was at fall risk. She called back in August 2014 and reported an improvement in her symptoms.   She previously followed with Dr. Morene Antu and was last seen by him on 01/01/2013, at which time he felt that she would benefit from physical therapy due to her lumbar spine stenosis with chronic lower back pain. He also increased her carbidopa-levodopa to 1-1/2 pills at 5 AM, one pill at 9 AM, one pill at 1 PM, 1 1/2 pills at 5 PM and one pill at 9 PM. Her falls assessment tool score at the time was 18.   She has an approximately 12-year history of Parkinson's disease treated with carbidopa-levodopa 25-100 mg strength 5 times daily. She developed lower back pain and was treated with hydrocodone, oxycodone and fentanyl patch. She also received epidural steroid injections.  She had an EEG in May 2011, which did not show any epileptiform discharges. MRI L-spine in March 2013 showed scoliosis, and severe degenerative joint disease. She has  had jerking movements in her legs. This does not occur while lying down but mostly with sitting or standing. She has developed bladder incontinence. In January 2014 her MMSE was 22, clock drawing was 4, animal fluency was 8.   She has a hospital bed with rails up at night. He helps her to use a bed pan. She has to urinate 2-5 times per night. She can walk about 15 feet at a time with 2 wheeled walker. She denies VH or AH and has not had dyskinesias. She lives close to [place] and had PT there. She had epidural injection at [facility] in the past, and at [facility] with good relief.    Her Past Medical History Is Significant For: Past Medical History:  Diagnosis Date  . Abnormality of gait   . Edema   . Fatigue   . Other chronic pulmonary heart diseases   . Paralysis agitans ([place])   . Parkinson disease ([place])   . Parkinson's disease ([place]) 10/04/2013  . Spinal stenosis   . Stiffness of joint, not elsewhere classified,  shoulder region     Her Past Surgical History Is Significant For: Past Surgical History:  Procedure Laterality Date  . ABDOMINAL HYSTERECTOMY      Her Family History Is Significant For: No family history on file.  Her Social History Is Significant For: Social History   Social History  . Marital status: Married    Spouse name: Pilar Plate  . Number of children: 3  . Years of education: 14   Occupational History  . RETIRED    Social History Main Topics  . Smoking status: Never Smoker  . Smokeless tobacco: Never Used  . Alcohol use No  . Drug use: No  . Sexual activity: Not Asked   Other Topics Concern  . None   Social History Narrative   Patient is right handed.resides in a home with husband    Her Allergies Are:  Allergies  Allergen Reactions  . Azithromycin Diarrhea  . Lyrica [Pregabalin]     Drops  blood pressure  . Tizanidine Other (See Comments)    hypotension  . Erythromycin Diarrhea  :   Her Current Medications Are:  Outpatient Encounter Prescriptions as of 06/28/2016  Medication Sig  . baclofen (LIORESAL) 10 MG tablet Take 10 mg by mouth daily.   . carbidopa-levodopa (SINEMET IR) 25-100 MG tablet Take 1.5 tablets by mouth 6 (six) times daily.  . cephALEXin (KEFLEX) 500 MG capsule Take 1 capsule (500 mg total) by mouth 3 (three) times daily.  . Cholecalciferol (VITAMIN D3) 5000 UNITS CAPS Take 1 capsule by mouth daily.  . diazepam (VALIUM) 5 MG tablet Take 0.5 tablets (2.5 mg total) by mouth daily.  . DULoxetine (CYMBALTA) 30 MG capsule Take 30 mg by mouth daily.   . ferrous sulfate 325 (65 FE) MG tablet Take 325 mg by mouth daily with breakfast.  . HYDROcodone-acetaminophen (NORCO/VICODIN) 5-325 MG per tablet Take 0.5-1 tablets by mouth 6 (six) times daily. TAKES ONE TABLET IN THE MORNING AND TAKES ONE-HALF TABLET 5 TIMES DAILY  . KRILL OIL PO Take 1 capsule by mouth daily.  . magnesium oxide (MAG-OX) 400 MG tablet Take 400 mg by mouth daily.  . Multiple Vitamins-Minerals (CENTRUM SILVER PO) Take 1 tablet by mouth daily.  . polyethylene glycol (MIRALAX / GLYCOLAX) packet Take 17 g by mouth daily.  . vitamin E 400 UNIT capsule Take 400 Units by mouth daily.  . VOLTAREN 1 % GEL Apply 1 application  topically daily as needed (for pain).    No facility-administered encounter medications on file as of 06/28/2016.   :  Review of Systems:  Out of a complete 14 point review of systems, all are reviewed and negative with the exception of these symptoms as listed below: Review of Systems  Neurological:       Patient has had some trouble with swallowing. Patient was recently in [facility] for 7 days.     Objective:  Neurologic Exam  Physical Exam Physical Examination:   Vitals:   06/28/16 1405  BP: 130/68  Pulse: 78  Resp: 16   General Examination: The patient is a very  pleasant 75 y.o. female in no acute distress. She is situated in a WC. She reports right sided back pain, unchanged.    HEENT: Normocephalic, atraumatic, pupils are equal, round and reactive to light and accommodation. Funduscopy is normal. Mild b/l cataracts are noted. She has corrective eye glasses. Extraocular tracking shows moderate saccadic breakdown without nystagmus noted. There is limitation to upper gaze. There is moderate decrease in eye blink rate. Hearing is mildly impaired. Face is symmetric with moderate facial masking and normal facial sensation. There is a mild lower lip tremor. Neck is moderately to severely rigid with intact passive ROM. There are no carotid bruits on auscultation. Oropharynx exam reveals moderate mouth dryness. No significant airway crowding is noted but she does have a floppy appearing soft palate. Mallampati is class II. Tongue protrudes centrally and palate elevates symmetrically. There is mild drooling.   Chest: is clear to auscultation without wheezing, rhonchi or crackles noted.  Heart: sounds are regular and normal without murmurs, rubs or gallops noted.   Abdomen: is soft, non-tender and non-distended with normal bowel sounds appreciated on auscultation.  Extremities: There is 1-2+ pitting edema in the distal lower extremities bilaterally, right more than left. Chronic stasis-like changes are noted in the distal legs bilaterally. There are no varicose veins, there is distal redness.  Skin: is warm and dry with no trophic changes noted. Age-related changes are noted on the skin.    Musculoskeletal: exam reveals no obvious joint deformities, tenderness, joint swelling or erythema, except severe decrease in ROM in R shoulder.  Neurologically:  Mental status: The patient is awake and alert, paying fair attention. She is able to partially provide the history. Her husband provides most of the history. She is oriented to: person, place, situation, day of week,  month of year and year, not date. Her memory, attention, language and knowledge are mildly impaired. There is no aphasia, agnosia, apraxia or anomia. There is a mild degree of bradyphrenia.   On 08/26/2014: MMSE 25/30, CDT 2/4, AFT: 9.   On 02/25/2015: MMSE: 27/30, CDT: 3/4, AFT: 8/min, GDS: 2/15.  On 08/28/2015: MMSE: 26/30, CDT: 3/4, AFT: 5/min.   On 02/25/2016: MMSE: 29/30, CDT: 4/4, AFT: 10/min.  Speech is mild to moderately hypophonic with mild to moderate dysarthria noted. Mood is congruent and affect is normal.  Cranial nerves are as described above under HEENT exam. In addition, shoulder shrug is normal with equal shoulder height noted.  Motor exam: Normal bulk, and strength is 4/5, with limitation in ROM in the R shoulder, unchanged. There are no dyskinesias noted.  Tone is moderately rigid with presence of cogwheeling in the right upper extremity. There is overall moderate bradykinesia. There is no drift or rebound.  There is an intermittent mild to moderate resting tremor in the right upper extremity and a mild  resting tremor in the left upper extremity. The tremor is intermittent. There is a slight intermittent resting tremor in both legs.  Romberg is not testable.   Reflexes are 1+ in the upper extremities and trace in the lower extremities. Fine motor skills exam: Finger taps are moderately to severely impaired on the right and moderately impaired on the left. Hand movements are moderately impaired on the right and moderately impaired on the left. RAP (rapid alternating patting) is moderately to severely impaired on the right and moderately impaired on the left. Foot taps are severely impaired on the right and moderately impaired on the left. Foot agility (in the form of heel stomping) is severely impaired on the right and moderately impaired on the left.    Cerebellar testing shows no dysmetria or intention tremor on finger to nose testing. There is no truncal or gait ataxia.    Sensory exam is intact to light touch in the upper and lower extremities.   Gait, station and balance: not testable safely, as she did not bring her 2 wheeled walker. She does stand briefly, bends over, due to pain.   Assessment and Plan:   In summary, Myangel Summons is a very pleasant 75 year old female with a history of advanced R sided predominant Parkinson's disease, complicated by memory loss, gait disorder, and chronic degenerative back d/s, with need for chronic narcotic pain medication and OSA on CPAP. She was recently hospitalized for UTI and AMS, is better, was tried on a bladder medication per PCP, but had severe mouth dryness and sore throat and stopped it after a few days. She is on Lasix for swelling of her legs. She is back on CPAP, since her D/C from the hospital. We increased her pressure 15 cm last time and AHI is down from 14/h, now at 6.7/hour. The increase in Sinemet to 1-1/2 pills 6 times a day, at 3 hourly intervals starting at 5 AM. She is advised to continue with this, did not need a refill. She was able to reduce hydrocodone by half in the daytime. She is at risk for balance problems, constipation, mouth dryness, and altered mental status, hallucinations from being on a narcotic type pain medication in conjunction with having Parkinson's disease. I will see her back in 4 months, sooner if needed.  I answered all their questions and the patient and her husband were in agreement.  I spent 25 minutes in total face-to-face time with the patient, more than 50% of which was spent in counseling and coordination of care, reviewing test results, reviewing medication and discussing or reviewing the diagnosis of PD and OSA, the prognosis and treatment options

## 2016-06-30 ENCOUNTER — Emergency Department (HOSPITAL_COMMUNITY): Payer: Medicare Other

## 2016-06-30 ENCOUNTER — Inpatient Hospital Stay (HOSPITAL_COMMUNITY)
Admission: EM | Admit: 2016-06-30 | Discharge: 2016-07-05 | DRG: 872 | Disposition: A | Discharge status: Home Health | Payer: Medicare Other | Attending: Internal Medicine | Admitting: Internal Medicine

## 2016-06-30 ENCOUNTER — Encounter (HOSPITAL_COMMUNITY): Payer: Self-pay | Admitting: Emergency Medicine

## 2016-06-30 DIAGNOSIS — A4159 Other Gram-negative sepsis: Principal | ICD-10-CM | POA: Diagnosis present

## 2016-06-30 DIAGNOSIS — Z820 Family history of epilepsy and other diseases of the nervous system: Secondary | ICD-10-CM

## 2016-06-30 DIAGNOSIS — N390 Urinary tract infection, site not specified: Secondary | ICD-10-CM

## 2016-06-30 DIAGNOSIS — B961 Klebsiella pneumoniae [K. pneumoniae] as the cause of diseases classified elsewhere: Secondary | ICD-10-CM | POA: Diagnosis present

## 2016-06-30 DIAGNOSIS — N179 Acute kidney failure, unspecified: Secondary | ICD-10-CM | POA: Diagnosis present

## 2016-06-30 DIAGNOSIS — D638 Anemia in other chronic diseases classified elsewhere: Secondary | ICD-10-CM | POA: Diagnosis present

## 2016-06-30 DIAGNOSIS — N136 Pyonephrosis: Secondary | ICD-10-CM | POA: Diagnosis present

## 2016-06-30 DIAGNOSIS — Z79891 Long term (current) use of opiate analgesic: Secondary | ICD-10-CM

## 2016-06-30 DIAGNOSIS — R509 Fever, unspecified: Secondary | ICD-10-CM

## 2016-06-30 DIAGNOSIS — D72829 Elevated white blood cell count, unspecified: Secondary | ICD-10-CM | POA: Diagnosis not present

## 2016-06-30 DIAGNOSIS — G20 Parkinson's disease: Secondary | ICD-10-CM | POA: Diagnosis present

## 2016-06-30 DIAGNOSIS — Z87442 Personal history of urinary calculi: Secondary | ICD-10-CM

## 2016-06-30 DIAGNOSIS — Z87440 Personal history of urinary (tract) infections: Secondary | ICD-10-CM

## 2016-06-30 DIAGNOSIS — G8929 Other chronic pain: Secondary | ICD-10-CM | POA: Diagnosis present

## 2016-06-30 DIAGNOSIS — Z881 Allergy status to other antibiotic agents status: Secondary | ICD-10-CM

## 2016-06-30 DIAGNOSIS — A419 Sepsis, unspecified organism: Secondary | ICD-10-CM | POA: Diagnosis present

## 2016-06-30 DIAGNOSIS — G4733 Obstructive sleep apnea (adult) (pediatric): Secondary | ICD-10-CM | POA: Diagnosis present

## 2016-06-30 DIAGNOSIS — I279 Pulmonary heart disease, unspecified: Secondary | ICD-10-CM | POA: Diagnosis present

## 2016-06-30 DIAGNOSIS — N10 Acute tubulo-interstitial nephritis: Secondary | ICD-10-CM | POA: Diagnosis present

## 2016-06-30 DIAGNOSIS — Z79899 Other long term (current) drug therapy: Secondary | ICD-10-CM

## 2016-06-30 DIAGNOSIS — N201 Calculus of ureter: Secondary | ICD-10-CM | POA: Diagnosis present

## 2016-06-30 DIAGNOSIS — Z888 Allergy status to other drugs, medicaments and biological substances status: Secondary | ICD-10-CM

## 2016-06-30 DIAGNOSIS — M549 Dorsalgia, unspecified: Secondary | ICD-10-CM | POA: Diagnosis present

## 2016-06-30 DIAGNOSIS — N3000 Acute cystitis without hematuria: Secondary | ICD-10-CM

## 2016-06-30 DIAGNOSIS — R7881 Bacteremia: Secondary | ICD-10-CM

## 2016-06-30 LAB — URINE MICROSCOPIC-ADD ON: RBC / HPF: NONE SEEN RBC/hpf (ref 0–5)

## 2016-06-30 LAB — CBC WITH DIFFERENTIAL/PLATELET
Basophils Absolute: 0 10*3/uL (ref 0.0–0.1)
Basophils Relative: 0 %
EOS PCT: 0 %
Eosinophils Absolute: 0 10*3/uL (ref 0.0–0.7)
HEMATOCRIT: 27.1 % — AB (ref 36.0–46.0)
Hemoglobin: 8.7 g/dL — ABNORMAL LOW (ref 12.0–15.0)
LYMPHS ABS: 0.9 10*3/uL (ref 0.7–4.0)
LYMPHS PCT: 5 %
MCH: 30.5 pg (ref 26.0–34.0)
MCHC: 32.1 g/dL (ref 30.0–36.0)
MCV: 95.1 fL (ref 78.0–100.0)
MONO ABS: 1.9 10*3/uL — AB (ref 0.1–1.0)
Monocytes Relative: 11 %
NEUTROS ABS: 14 10*3/uL — AB (ref 1.7–7.7)
Neutrophils Relative %: 84 %
PLATELETS: 422 10*3/uL — AB (ref 150–400)
RBC: 2.85 MIL/uL — ABNORMAL LOW (ref 3.87–5.11)
RDW: 14.5 % (ref 11.5–15.5)
WBC: 16.8 10*3/uL — ABNORMAL HIGH (ref 4.0–10.5)

## 2016-06-30 LAB — URINALYSIS, ROUTINE W REFLEX MICROSCOPIC
BILIRUBIN URINE: NEGATIVE
GLUCOSE, UA: NEGATIVE mg/dL
HGB URINE DIPSTICK: NEGATIVE
Ketones, ur: NEGATIVE mg/dL
Nitrite: POSITIVE — AB
SPECIFIC GRAVITY, URINE: 1.01 (ref 1.005–1.030)
pH: 7 (ref 5.0–8.0)

## 2016-06-30 LAB — I-STAT CG4 LACTIC ACID, ED: LACTIC ACID, VENOUS: 1.68 mmol/L (ref 0.5–1.9)

## 2016-06-30 LAB — COMPREHENSIVE METABOLIC PANEL
ALBUMIN: 2.7 g/dL — AB (ref 3.5–5.0)
ALT: <5 U/L — ABNORMAL LOW (ref 14–54)
AST: 18 U/L (ref 15–41)
Alkaline Phosphatase: 72 U/L (ref 38–126)
Anion gap: 9 (ref 5–15)
BUN: 16 mg/dL (ref 6–20)
CHLORIDE: 99 mmol/L — AB (ref 101–111)
CO2: 27 mmol/L (ref 22–32)
Calcium: 8.3 mg/dL — ABNORMAL LOW (ref 8.9–10.3)
Creatinine, Ser: 1.12 mg/dL — ABNORMAL HIGH (ref 0.44–1.00)
GFR calc Af Amer: 53 mL/min — ABNORMAL LOW (ref >60)
GFR calc non Af Amer: 46 mL/min — ABNORMAL LOW (ref >60)
GLUCOSE: 146 mg/dL — AB (ref 65–99)
POTASSIUM: 4.6 mmol/L (ref 3.5–5.1)
SODIUM: 135 mmol/L (ref 135–145)
Total Bilirubin: 0.6 mg/dL (ref 0.3–1.2)
Total Protein: 7.4 g/dL (ref 6.5–8.1)

## 2016-06-30 LAB — TROPONIN I

## 2016-06-30 MED ORDER — FERROUS SULFATE 325 (65 FE) MG PO TABS
325.0000 mg | ORAL_TABLET | Freq: Every day | ORAL | Status: DC
  Administered 2016-07-01 – 2016-07-05 (×5): 325 mg via ORAL
  Filled 2016-06-30 (×5): qty 1

## 2016-06-30 MED ORDER — SODIUM CHLORIDE 0.9 % IV BOLUS (SEPSIS)
1000.0000 mL | Freq: Once | INTRAVENOUS | Status: AC
  Administered 2016-06-30: 1000 mL via INTRAVENOUS

## 2016-06-30 MED ORDER — BACLOFEN 10 MG PO TABS
10.0000 mg | ORAL_TABLET | Freq: Every day | ORAL | Status: DC
  Administered 2016-06-30 – 2016-07-05 (×6): 10 mg via ORAL
  Filled 2016-06-30 (×6): qty 1

## 2016-06-30 MED ORDER — ENOXAPARIN SODIUM 40 MG/0.4ML ~~LOC~~ SOLN
40.0000 mg | SUBCUTANEOUS | Status: DC
  Administered 2016-06-30 – 2016-07-04 (×3): 40 mg via SUBCUTANEOUS
  Filled 2016-06-30 (×5): qty 0.4

## 2016-06-30 MED ORDER — ONDANSETRON HCL 4 MG PO TABS: 4.0000 mg | ORAL_TABLET | Freq: Four times a day (QID) | ORAL | Status: DC | PRN

## 2016-06-30 MED ORDER — HYDROCODONE-ACETAMINOPHEN 5-325 MG PO TABS
0.5000 | ORAL_TABLET | ORAL | Status: DC
  Administered 2016-06-30 – 2016-07-05 (×22): 0.5 via ORAL
  Filled 2016-06-30 (×22): qty 1

## 2016-06-30 MED ORDER — DEXTROSE 5 % IV SOLN
1.0000 g | INTRAVENOUS | Status: DC
  Filled 2016-06-30 (×2): qty 10

## 2016-06-30 MED ORDER — SODIUM CHLORIDE 0.9 % IV BOLUS (SEPSIS)
1000.0000 mL | Freq: Once | INTRAVENOUS | Status: AC
Start: 2016-06-30 — End: 2016-06-30
  Administered 2016-06-30: 1000 mL via INTRAVENOUS

## 2016-06-30 MED ORDER — ONDANSETRON HCL 4 MG/2ML IJ SOLN: 4.0000 mg | Freq: Four times a day (QID) | INTRAMUSCULAR | Status: DC | PRN

## 2016-06-30 MED ORDER — MAGNESIUM OXIDE 400 MG PO TABS
400.0000 mg | ORAL_TABLET | Freq: Every day | ORAL | Status: DC
  Filled 2016-06-30 (×3): qty 1

## 2016-06-30 MED ORDER — ACETAMINOPHEN 650 MG RE SUPP
RECTAL | Status: AC
  Administered 2016-06-30: 07:00:00
  Filled 2016-06-30: qty 1

## 2016-06-30 MED ORDER — MAGNESIUM OXIDE 400 (241.3 MG) MG PO TABS
400.0000 mg | ORAL_TABLET | Freq: Every day | ORAL | Status: DC
  Administered 2016-06-30 – 2016-07-05 (×6): 400 mg via ORAL
  Filled 2016-06-30 (×6): qty 1

## 2016-06-30 MED ORDER — ACETAMINOPHEN 325 MG PO TABS
650.0000 mg | ORAL_TABLET | Freq: Four times a day (QID) | ORAL | Status: DC | PRN
  Administered 2016-07-01 – 2016-07-04 (×2): 650 mg via ORAL
  Filled 2016-06-30 (×2): qty 2

## 2016-06-30 MED ORDER — HYDROCODONE-ACETAMINOPHEN 5-325 MG PO TABS
1.0000 | ORAL_TABLET | Freq: Every day | ORAL | Status: DC
  Administered 2016-07-01 – 2016-07-05 (×5): 1 via ORAL
  Filled 2016-06-30 (×7): qty 1

## 2016-06-30 MED ORDER — POLYETHYLENE GLYCOL 3350 17 G PO PACK
17.0000 g | PACK | Freq: Every day | ORAL | Status: DC
  Administered 2016-06-30 – 2016-07-05 (×6): 17 g via ORAL
  Filled 2016-06-30 (×6): qty 1

## 2016-06-30 MED ORDER — MORPHINE SULFATE (PF) 2 MG/ML IV SOLN
1.0000 mg | INTRAVENOUS | Status: DC | PRN
Start: 2016-06-30 — End: 2016-06-30
  Administered 2016-06-30: 1 mg via INTRAVENOUS
  Filled 2016-06-30: qty 1

## 2016-06-30 MED ORDER — SODIUM CHLORIDE 0.9 % IV SOLN: INTRAVENOUS | Status: AC

## 2016-06-30 MED ORDER — CETYLPYRIDINIUM CHLORIDE 0.05 % MT LIQD
7.0000 mL | Freq: Two times a day (BID) | OROMUCOSAL | Status: DC
  Administered 2016-06-30 – 2016-07-05 (×10): 7 mL via OROMUCOSAL

## 2016-06-30 MED ORDER — DULOXETINE HCL 30 MG PO CPEP
30.0000 mg | ORAL_CAPSULE | Freq: Every day | ORAL | Status: DC
  Administered 2016-06-30 – 2016-07-05 (×6): 30 mg via ORAL
  Filled 2016-06-30 (×6): qty 1

## 2016-06-30 MED ORDER — SODIUM CHLORIDE 0.9 % IV SOLN
INTRAVENOUS | Status: DC
  Administered 2016-06-30 (×2): via INTRAVENOUS

## 2016-06-30 MED ORDER — DEXTROSE 5 % IV SOLN
1.0000 g | INTRAVENOUS | Status: DC
  Administered 2016-06-30: 1 g via INTRAVENOUS
  Filled 2016-06-30 (×2): qty 10

## 2016-06-30 MED ORDER — DIAZEPAM 5 MG PO TABS
2.5000 mg | ORAL_TABLET | Freq: Every day | ORAL | Status: DC
  Administered 2016-06-30 – 2016-07-05 (×6): 2.5 mg via ORAL
  Filled 2016-06-30 (×7): qty 1

## 2016-06-30 MED ORDER — SODIUM CHLORIDE 0.9 % IV SOLN
INTRAVENOUS | Status: DC
  Administered 2016-06-30 – 2016-07-04 (×6): via INTRAVENOUS

## 2016-06-30 MED ORDER — ACETAMINOPHEN 650 MG RE SUPP
650.0000 mg | Freq: Four times a day (QID) | RECTAL | Status: DC | PRN
  Administered 2016-06-30: 650 mg via RECTAL
  Filled 2016-06-30: qty 1

## 2016-06-30 MED ORDER — SENNOSIDES-DOCUSATE SODIUM 8.6-50 MG PO TABS: 1.0000 | ORAL_TABLET | Freq: Every evening | ORAL | Status: DC | PRN

## 2016-06-30 MED ORDER — CARBIDOPA-LEVODOPA 25-100 MG PO TABS
1.5000 | ORAL_TABLET | Freq: Every day | ORAL | Status: DC
  Administered 2016-06-30 – 2016-07-03 (×17): 1.5 via ORAL
  Administered 2016-07-03: 1 via ORAL
  Administered 2016-07-03 – 2016-07-05 (×11): 1.5 via ORAL
  Filled 2016-06-30 (×2): qty 2
  Filled 2016-06-30: qty 1
  Filled 2016-06-30 (×10): qty 2
  Filled 2016-06-30: qty 1
  Filled 2016-06-30 (×10): qty 2
  Filled 2016-06-30: qty 1
  Filled 2016-06-30: qty 1.5
  Filled 2016-06-30 (×2): qty 2
  Filled 2016-06-30: qty 1

## 2016-06-30 NOTE — ED Notes (Signed)
Pt BP decreased to 95/44, pt saline infusion opens to 1062ml/hr. MD made aware.

## 2016-06-30 NOTE — ED Provider Notes (Signed)
[facility]-EMER[facility]ENCY DEPT Provider Note   CSN: [id number] Arrival date & time: 06/30/16  4098  First Provider Contact:  0715      History   Chief Complaint Chief Complaint  Patient presents with  . Fever    HPI Carol Mckee is a 75 y.o. female.  HPI  Pt was seen at 0715. Per pt and her family: c/o gradual onset and persistence of constant "feeling warm" since yesterday. Pt's husband states pt would not take her meds this morning. Pt's daughter states pt has hx of fevers and sepsis due to UTI. Denies cough/SOB, no CP, no abd pain, no N/V/D.   Past Medical History:  Diagnosis Date  . Abnormality of gait   . Edema   . Fatigue   . Other chronic pulmonary heart diseases   . Paralysis agitans (HCC)   . Parkinson disease (HCC)   . Parkinson's disease (HCC) 10/04/2013  . Spinal stenosis   . Stiffness of joint, not elsewhere classified,  shoulder region     Patient Active Problem List   Diagnosis Date Noted  . Gram negative septicemia (HCC) 05/27/2016  . Acute encephalopathy 05/27/2016  . Encephalopathy, metabolic 05/26/2016  . Encephalopathy acute 05/26/2016  . Severe sepsis (HCC) 05/26/2016  . UTI (urinary tract infection) with Sepsis 05/26/2016  . Parkinson's disease (HCC) 10/04/2013  . Bradycardia 01/18/2012  . Edema 01/18/2012    Past Surgical History:  Procedure Laterality Date  . ABDOMINAL HYSTERECTOMY         Home Medications    Prior to Admission medications   Medication Sig Start Date End Date Taking? Authorizing Provider  baclofen (LIORESAL) 10 MG tablet Take 10 mg by mouth daily.  04/17/13   Historical Provider, MD  carbidopa-levodopa (SINEMET IR) 25-100 MG tablet Take 1.5 tablets by mouth 6 (six) times daily. 02/25/16   Huston Foley, MD  cephALEXin (KEFLEX) 500 MG capsule Take 1 capsule (500 mg total) by mouth 3 (three) times daily. 06/02/16   Erick Blinks, MD  Cholecalciferol (VITAMIN D3) 5000 UNITS CAPS Take 1 capsule by mouth daily.    Historical  Provider, MD  diazepam (VALIUM) 5 MG tablet Take 0.5 tablets (2.5 mg total) by mouth daily. 03/18/16   Asa Lente, MD  DULoxetine (CYMBALTA) 30 MG capsule Take 30 mg by mouth daily.  04/05/13   Historical Provider, MD  ferrous sulfate 325 (65 FE) MG tablet Take 325 mg by mouth daily with breakfast.    Historical Provider, MD  HYDROcodone-acetaminophen (NORCO/VICODIN) 5-325 MG per tablet Take 0.5-1 tablets by mouth 6 (six) times daily. TAKES ONE TABLET IN THE MORNING AND TAKES ONE-HALF TABLET 5 TIMES DAILY    Historical Provider, MD  KRILL OIL PO Take 1 capsule by mouth daily.    Historical Provider, MD  magnesium oxide (MAG-OX) 400 MG tablet Take 400 mg by mouth daily.    Historical Provider, MD  Multiple Vitamins-Minerals (CENTRUM SILVER PO) Take 1 tablet by mouth daily.    Historical Provider, MD  polyethylene glycol (MIRALAX / GLYCOLAX) packet Take 17 g by mouth daily.    Historical Provider, MD  vitamin E 400 UNIT capsule Take 400 Units by mouth daily.    Historical Provider, MD  VOLTAREN 1 % GEL Apply 1 application topically daily as needed (for pain).  08/30/13   Historical Provider, MD    Family History   Social History Social History  Substance Use Topics  . Smoking status: Never Smoker  . Smokeless tobacco: Never Used  .  Alcohol use No     Allergies   Azithromycin; Lyrica [pregabalin]; Tizanidine; and Erythromycin   Review of Systems Review of Systems ROS: Statement: All systems negative except as marked or noted in the HPI; Constitutional: +fever and chills. ; ; Eyes: Negative for eye pain, redness and discharge. ; ; ENMT: Negative for ear pain, hoarseness, nasal congestion, sinus pressure and sore throat. ; ; Cardiovascular: Negative for chest pain, palpitations, diaphoresis, dyspnea and peripheral edema. ; ; Respiratory: Negative for cough, wheezing and stridor. ; ; Gastrointestinal: Negative for nausea, vomiting, diarrhea, abdominal pain, blood in stool, hematemesis,  jaundice and rectal bleeding. . ; ; Genitourinary: Negative for dysuria, flank pain and hematuria. ; ; Musculoskeletal: Negative for back pain and neck pain. Negative for swelling and trauma.; ; Skin: Negative for pruritus, rash, abrasions, blisters, bruising and skin lesion.; ; Neuro: Negative for headache, lightheadedness and neck stiffness. Negative for weakness, altered level of consciousness, altered mental status, extremity weakness, paresthesias, involuntary movement, seizure and syncope.       Physical Exam Updated Vital Signs BP (!) 109/49   Pulse 99   Temp 100.9 F (38.3 C) (Rectal)   Resp 17   Wt 210 lb (95.3 kg)   SpO2 90%   BMI 32.89 kg/m    Patient Vitals for the past 24 hrs:  BP Temp Temp src Pulse Resp SpO2 Weight  06/30/16 0732 - 100.9 F (38.3 C) Rectal - - - -  06/30/16 0730 (!) 109/49 - - 99 17 90 % -  06/30/16 0700 (!) 128/44 - - - 23 93 % -  06/30/16 0637 (!) 124/52 102.1 F (38.9 C) Rectal 100 22 91 % 210 lb (95.3 kg)     Physical Exam 0720: Physical examination:  Nursing notes reviewed; Vital signs and O2 SAT reviewed; +febrile.;; Constitutional: Well developed, Well nourished, In no acute distress; Head:  Normocephalic, atraumatic; Eyes: EOMI, PERRL, No scleral icterus; ENMT: Mouth and pharynx normal, Mucous membranes dry; Neck: Supple, Full range of motion, No lymphadenopathy; Cardiovascular: Tachycardic rate and rhythm, No gallop; Respiratory: Breath sounds clear & equal bilaterally, No wheezes. Normal respiratory effort/excursion; Chest: Nontender, Movement normal; Abdomen: Soft, Nontender, Nondistended, Normal bowel sounds; Genitourinary: No CVA tenderness; Extremities: Pulses normal, No tenderness, No edema, +1 RLE calf edema with asymmetry (chronic per family).; Neuro: Awake, alert, vague historian. No facial droop. Speech minimal. Moves extremities on stretcher.; Skin: Color normal, Warm, Dry.   ED Treatments / Results  Labs (all labs ordered are  listed, but only abnormal results are displayed)   EKG  EKG Interpretation  Date/Time:  Wednesday June 30 2016 06:37:38 EDT Ventricular Rate:  97 PR Interval:    QRS Duration: 93 QT Interval:  309 QTC Calculation: 393 R Axis:   44 Text Interpretation:  Sinus  tachycardia Baseline wander in lead(s) V5 Artifact No significant change since last tracing 26 May 2016 Confirmed by KNAPP  MD-I, IVA ([id number]) on 06/30/2016 6:40:31 AM       Radiology   Procedures Procedures (including critical care time)  Medications Ordered in ED Medications  0.9 %  sodium chloride infusion (not administered)  acetaminophen (TYLENOL) 650 MG suppository (  Given 06/30/16 0648)  sodium chloride 0.9 % bolus 1,000 mL (1,000 mLs Intravenous New Bag/Given 06/30/16 0733)     Initial Impression / Assessment and Plan / ED Course  I have reviewed the triage vital signs and the nursing notes.  Pertinent labs & imaging results that were available during my care  of the patient were reviewed by me and considered in my medical decision making (see chart for details).  MDM Reviewed: previous chart, nursing note and vitals Reviewed previous: labs and ECG Interpretation: labs, ECG and x-ray   Results for orders placed or performed during the hospital encounter of 06/30/16  Comprehensive metabolic panel  Result Value Ref Range   Sodium 135 135 - 145 mmol/L   Potassium 4.6 3.5 - 5.1 mmol/L   Chloride 99 (L) 101 - 111 mmol/L   CO2 27 22 - 32 mmol/L   Glucose, Bld 146 (H) 65 - 99 mg/dL   BUN 16 6 - 20 mg/dL   Creatinine, Ser 2.13 (H) 0.44 - 1.00 mg/dL   Calcium 8.3 (L) 8.9 - 10.3 mg/dL   Total Protein 7.4 6.5 - 8.1 g/dL   Albumin 2.7 (L) 3.5 - 5.0 g/dL   AST 18 15 - 41 U/L   ALT <5 (L) 14 - 54 U/L   Alkaline Phosphatase 72 38 - 126 U/L   Total Bilirubin 0.6 0.3 - 1.2 mg/dL   GFR calc non Af Amer 46 (L) >60 mL/min   GFR calc Af Amer 53 (L) >60 mL/min   Anion gap 9 5 - 15  CBC WITH DIFFERENTIAL  Result  Value Ref Range   WBC 16.8 (H) 4.0 - 10.5 K/uL   RBC 2.85 (L) 3.87 - 5.11 MIL/uL   Hemoglobin 8.7 (L) 12.0 - 15.0 g/dL   HCT 08.6 (L) 57.8 - 46.9 %   MCV 95.1 78.0 - 100.0 fL   MCH 30.5 26.0 - 34.0 pg   MCHC 32.1 30.0 - 36.0 g/dL   RDW 62.9 52.8 - 41.3 %   Platelets 422 (H) 150 - 400 K/uL   Neutrophils Relative % 84 %   Neutro Abs 14.0 (H) 1.7 - 7.7 K/uL   Lymphocytes Relative 5 %   Lymphs Abs 0.9 0.7 - 4.0 K/uL   Monocytes Relative 11 %   Monocytes Absolute 1.9 (H) 0.1 - 1.0 K/uL   Eosinophils Relative 0 %   Eosinophils Absolute 0.0 0.0 - 0.7 K/uL   Basophils Relative 0 %   Basophils Absolute 0.0 0.0 - 0.1 K/uL  Urinalysis, Routine w reflex microscopic (not at [facility])  Result Value Ref Range   Color, Urine YELLOW YELLOW   APPearance CLEAR CLEAR   Specific Gravity, Urine 1.010 1.005 - 1.030   pH 7.0 5.0 - 8.0   Glucose, UA NEGATIVE NEGATIVE mg/dL   Hgb urine dipstick NEGATIVE NEGATIVE   Bilirubin Urine NEGATIVE NEGATIVE   Ketones, ur NEGATIVE NEGATIVE mg/dL   Protein, ur TRACE (A) NEGATIVE mg/dL   Nitrite POSITIVE (A) NEGATIVE   Leukocytes, UA TRACE (A) NEGATIVE  Troponin I  Result Value Ref Range   Troponin I <0.03 <0.03 ng/mL  Urine microscopic-add on  Result Value Ref Range   Squamous Epithelial / LPF 0-5 (A) NONE SEEN   WBC, UA 0-5 0 - 5 WBC/hpf   RBC / HPF NONE SEEN 0 - 5 RBC/hpf   Bacteria, UA MANY (A) NONE SEEN  I-Stat CG4 Lactic Acid, ED  Result Value Ref Range   Lactic Acid, Venous 1.68 0.5 - 1.9 mmol/L    Dg Chest Port 1 View Result Date: 06/30/2016 CLINICAL DATA:  Fever EXAM: PORTABLE CHEST 1 VIEW COMPARISON:  June 29, 2016 FINDINGS: There is no edema or consolidation. Heart size and pulmonary vascularity are within normal limits. There is atherosclerotic calcification in the aorta. No adenopathy. There  is degenerative change in each shoulder and in the thoracic spine. IMPRESSION: There is no edema or consolidation. There is aortic atherosclerosis.  Electronically Signed   By: Bretta Bang III M.D.   On: 06/30/2016 07:09   0830:  APAP given for fever. Pt requesting pain meds (takes chronic narcotics due to chronic pain); IV morphine given. +UTI, UC and BC pending; IV rocephin ordered.  Dx and testing d/w pt and family.  Questions answered.  Verb understanding, agreeable to admit.  T/C to [facility] Ardyth Harps, case discussed, including:  HPI, pertinent PM/SHx, VS/PE, dx testing, ED course and treatment:  Agreeable to admit, requests to write temporary orders, obtain observation bed to team APAdmits.      Final Clinical Impressions(s) / ED Diagnoses   Final diagnoses:  Fever, unspecified fever cause  Urinary tract infection without hematuria, site unspecified    New Prescriptions New Prescriptions   No medications on file     Samuel Jester, DO 07/03/16 1219

## 2016-06-30 NOTE — ED Triage Notes (Signed)
Per husband pt would not swallow meds this am and was running a fever.

## 2016-06-30 NOTE — H&P (Signed)
History and Physical    Carol Mckee HVF:[id number] DOB: [birth year] DOA: 06/30/2016  Referring MD/NP/PA: [name] Emms, EDP PCP: Selinda Flavin, MD  Patient coming from: Home  Chief Complaint: Fever  HPI: [name] is a 75 y.o. female with history of Parkinson'Carol disease with a recent prolonged hospitalization due to Klebsiella UTI and sepsis. She presents to the hospital today with a 12 hour history of fever up to 103 at home, chills, dysuria. In the ED she was found to have a urinalysis that was consistent with a UTI, a temp of 102.1. No clinical indication of sepsis. Admission was requested for further evaluation and management.  Past Medical/Surgical History: Past Medical History:  Diagnosis Date  . Abnormality of gait   . Edema   . Fatigue   . Other chronic pulmonary heart diseases   . Paralysis agitans (HCC)   . Parkinson disease (HCC)   . Parkinson'Carol disease (HCC) 10/04/2013  . Spinal stenosis   . Stiffness of joint, not elsewhere classified,  shoulder region     Past Surgical History:  Procedure Laterality Date  . ABDOMINAL HYSTERECTOMY      Social History:  reports that she has never smoked. She has never used smokeless tobacco. She reports that she does not drink alcohol or use drugs.  Allergies: Allergies  Allergen Reactions  . Azithromycin Diarrhea  . Lyrica [Pregabalin]     Drops blood pressure  . Tizanidine Other (See Comments)    hypotension  . Erythromycin Diarrhea    Family History:  Parkinson'Carol disease in father  Prior to Admission medications   Medication Sig Start Date End Date Taking? Authorizing Provider  baclofen (LIORESAL) 10 MG tablet Take 10 mg by mouth daily.  04/17/13  Yes Historical Provider, MD  carbidopa-levodopa (SINEMET IR) 25-100 MG tablet Take 1.5 tablets by mouth 6 (six) times daily. 02/25/16  Yes Huston Foley, MD  Cholecalciferol (VITAMIN D3) 5000 UNITS CAPS Take 1 capsule by mouth daily.   Yes Historical Provider, MD  diazepam  (VALIUM) 5 MG tablet Take 0.5 tablets (2.5 mg total) by mouth daily. 03/18/16  Yes Asa Lente, MD  DULoxetine (CYMBALTA) 30 MG capsule Take 30 mg by mouth daily.  04/05/13  Yes Historical Provider, MD  ferrous sulfate 325 (65 FE) MG tablet Take 325 mg by mouth daily with breakfast.   Yes Historical Provider, MD  HYDROcodone-acetaminophen (NORCO/VICODIN) 5-325 MG per tablet Take 0.5-1 tablets by mouth 6 (six) times daily. TAKES ONE TABLET IN THE MORNING AND TAKES ONE-HALF TABLET 5 TIMES DAILY   Yes Historical Provider, MD  KRILL OIL PO Take 1 capsule by mouth daily.   Yes Historical Provider, MD  magnesium oxide (MAG-OX) 400 MG tablet Take 400 mg by mouth daily.   Yes Historical Provider, MD  Multiple Vitamins-Minerals (CENTRUM SILVER PO) Take 1 tablet by mouth daily.   Yes Historical Provider, MD  polyethylene glycol (MIRALAX / GLYCOLAX) packet Take 17 g by mouth daily.   Yes Historical Provider, MD  vitamin E 400 UNIT capsule Take 400 Units by mouth daily.   Yes Historical Provider, MD  cephALEXin (KEFLEX) 500 MG capsule Take 1 capsule (500 mg total) by mouth 3 (three) times daily. Patient not taking: Reported on 06/30/2016 06/02/16   Erick Blinks, MD  furosemide (LASIX) 20 MG tablet Take 20 mg by mouth daily as needed. 06/09/16   Historical Provider, MD  VOLTAREN 1 % GEL Apply 1 application topically daily as needed (for pain).  08/30/13  Historical Provider, MD    Review of Systems:  Constitutional: Denies  diaphoresis, appetite change and fatigue.  HEENT: Denies photophobia, eye pain, redness, hearing loss, ear pain, congestion, sore throat, rhinorrhea, sneezing, mouth sores, trouble swallowing, neck pain, neck stiffness and tinnitus.   Respiratory: Denies SOB, DOE, cough, chest tightness,  and wheezing.   Cardiovascular: Denies chest pain, palpitations and leg swelling.  Gastrointestinal: Denies nausea, vomiting, abdominal pain, diarrhea, constipation, blood in stool and abdominal  distention.  Genitourinary: Denies  hematuria, flank pain and difficulty urinating.  Endocrine: Denies: hot or cold intolerance, sweats, changes in hair or nails, polyuria, polydipsia. Musculoskeletal: Denies myalgias, back pain, joint swelling, arthralgias and gait problem.  Skin: Denies pallor, rash and wound.  Neurological: Denies dizziness, seizures, syncope, weakness, light-headedness, numbness and headaches.  Hematological: Denies adenopathy. Easy bruising, personal or family bleeding history  Psychiatric/Behavioral: Denies suicidal ideation, mood changes, confusion, nervousness, sleep disturbance and agitation    Physical Exam: Vitals:   06/30/16 0900 06/30/16 0934 06/30/16 0944 06/30/16 1035  BP: (!) 99/36 (!) 95/44 (!) 101/49 (!) 120/48  Pulse: 88 77 78 83  Resp: Temp:    99.3 F (37.4 C)  TempSrc:    Axillary  SpO2: 98% 98% 94% 95%  Weight:      Height:     (1.651 m)     Constitutional: NAD, calm, comfortable Eyes: PERRL, lids and conjunctivae normal ENMT: Mucous membranes are moist. Posterior pharynx clear of any exudate or lesions.Normal dentition.  Neck: normal, supple, no masses, no thyromegaly Respiratory: clear to auscultation bilaterally, no wheezing, no crackles. Normal respiratory effort. No accessory muscle use.  Cardiovascular: Regular rate and rhythm, no murmurs / rubs / gallops. No extremity edema. 2+ pedal pulses. No carotid bruits.  Abdomen: no tenderness, no masses palpated. No hepatosplenomegaly. Bowel sounds positive.  Musculoskeletal: no clubbing / cyanosis. No joint deformity upper and lower extremities. Good ROM, no contractures. Normal muscle tone.  Skin: no rashes, lesions, ulcers. No induration Neurologic: CN 2-12 grossly intact. Sensation intact, DTR normal. Strength 5/5 in all 4.  Psychiatric: Normal judgment and insight. Alert and oriented x 3. Normal mood.    Labs on Admission: I have personally reviewed the following labs  and imaging studies  CBC:  Recent Labs Lab 06/30/16 0653  WBC 16.8*  NEUTROABS 14.0*  HGB 8.7*  HCT 27.1*  MCV 95.1  PLT 422*   Basic Metabolic Panel:  Recent Labs Lab 06/30/16 0653  NA 135  K 4.6  CL 99*  CO2 27  GLUCOSE 146*  BUN 16  CREATININE 1.12*  CALCIUM 8.3*   GFR: Estimated Creatinine Clearance: 48 mL/min (by C-G formula based on SCr of 1.12 mg/dL). Liver Function Tests:  Recent Labs Lab 06/30/16 0653  AST 18  ALT <5*  ALKPHOS 72  BILITOT 0.6  PROT 7.4  ALBUMIN 2.7*   No results for input(Carol): LIPASE, AMYLASE in the last 168 hours. No results for input(Carol): AMMONIA in the last 168 hours. Coagulation Profile: No results for input(Carol): INR, PROTIME in the last 168 hours. Cardiac Enzymes:  Recent Labs Lab 06/30/16 0653  TROPONINI <0.03   BNP (last 3 results) No results for input(Carol): PROBNP in the last 8760 hours. HbA1C: No results for input(Carol): HGBA1C in the last 72 hours. CBG: No results for input(Carol): GLUCAP in the last 168 hours. Lipid Profile: No results for input(Carol): CHOL, HDL, LDLCALC, TRIG, CHOLHDL, LDLDIRECT in the last 72 hours. Thyroid Function  Tests: No results for input(Carol): TSH, T4TOTAL, FREET4, T3FREE, THYROIDAB in the last 72 hours. Anemia Panel: No results for input(Carol): VITAMINB12, FOLATE, FERRITIN, TIBC, IRON, RETICCTPCT in the last 72 hours. Urine analysis:    Component Value Date/Time   COLORURINE YELLOW 06/30/2016 0733   APPEARANCEUR CLEAR 06/30/2016 0733   LABSPEC 1.010 06/30/2016 0733   PHURINE 7.0 06/30/2016 0733   GLUCOSEU NEGATIVE 06/30/2016 0733   HGBUR NEGATIVE 06/30/2016 0733   BILIRUBINUR NEGATIVE 06/30/2016 0733   KETONESUR NEGATIVE 06/30/2016 0733   PROTEINUR TRACE (A) 06/30/2016 0733   UROBILINOGEN 0.2 04/19/2012 1532   NITRITE POSITIVE (A) 06/30/2016 0733   LEUKOCYTESUR TRACE (A) 06/30/2016 0733   Sepsis Labs: @LABRCNTIP (procalcitonin:4,lacticidven:4) ) Recent Results (from the past 240 hour(Carol))    Blood Culture (routine x 2)     Status: None (Preliminary result)   Collection Time: 06/30/16  6:53 AM  Result Value Ref Range Status   Specimen Description BLOOD  Final   Special Requests NONE  Final   Culture NO GROWTH < 12 HOURS  Final   Report Status PENDING  Incomplete  Blood Culture (routine x 2)     Status: None (Preliminary result)   Collection Time: 06/30/16  7:15 AM  Result Value Ref Range Status   Specimen Description BLOOD  Final   Special Requests NONE  Final   Culture NO GROWTH < 12 HOURS  Final   Report Status PENDING  Incomplete     Radiological Exams on Admission: Dg Chest Port 1 View  Result Date: 06/30/2016 CLINICAL DATA:  Fever EXAM: PORTABLE CHEST 1 VIEW COMPARISON:  June 29, 2016 FINDINGS: There is no edema or consolidation. Heart size and pulmonary vascularity are within normal limits. There is atherosclerotic calcification in the aorta. No adenopathy. There is degenerative change in each shoulder and in the thoracic spine. IMPRESSION: There is no edema or consolidation. There is aortic atherosclerosis. Electronically Signed   By: Bretta Bang III M.D.   On: 06/30/2016 07:09   EKG: Independently reviewed. Sinus tachycardia at a rate of 97, no acute ischemic abnormalities  Assessment/Plan Principal Problem:   Fever Active Problems:   UTI (urinary tract infection)   Parkinson'Carol disease (HCC)   Acute renal failure (ARF) (HCC)   Leukocytosis    Fever/UTI -Previous urinalysis with Klebsiella sensitive to Rocephin. -We'll continue IV Rocephin pending final culture data, will request blood cultures.  Acute renal failure -Give IV fluids, recheck renal function in a.m.  Leukocytosis -Likely due to UTI, monitor with antibiotics.  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 -Hemoglobin remains at baseline of around 9.   DVT prophylaxis: Lovenox  Code Status: Full code  Family Communication: Husband at bedside   Disposition Plan: To be determined  Consults called: None  Admission status: Observation    Time Spent: 75 minutes  Chaya Jan MD [facility] Pager [phone]  If 7PM-7AM, please contact night-coverage [email or web address] Password [facility]  06/30/2016, 12:19 PM

## 2016-06-30 NOTE — Progress Notes (Signed)
Pharmacy Antibiotic Note  Carol Mckee is a 75 y.o. female admitted on 06/30/2016 with UTI.  Pharmacy has been consulted for ROCEPHIN dosing.  Plan: Rocephin 1gm IV q24hrs Monitor labs, progress, c/s  Height: 5\' 5"  (165.1 cm) Weight: 210 lb (95.3 kg) IBW/kg (Calculated) : 57  Temp (24hrs), Avg:100.8 F (38.2 C), Min:99.3 F (37.4 C), Max:102.1 F (38.9 C)   Recent Labs Lab 06/30/16 0653 06/30/16 0659  WBC 16.8*  --   CREATININE 1.12*  --   LATICACIDVEN  --  1.68    Estimated Creatinine Clearance: 48 mL/min (by C-G formula based on SCr of 1.12 mg/dL).    Allergies  Allergen Reactions  . Azithromycin Diarrhea  . Lyrica [Pregabalin]     Drops blood pressure  . Tizanidine Other (See Comments)    hypotension  . Erythromycin Diarrhea   Anti-infectives    Start     Dose/Rate Route Frequency Ordered Stop   06/30/16 0900  cefTRIAXone (ROCEPHIN) 1 g in dextrose 5 % 50 mL IVPB     1 g 100 mL/hr over 30 Minutes Intravenous Every 24 hours 06/30/16 0846       Recent Results (from the past 240 hour(s))  Blood Culture (routine x 2)     Status: None (Preliminary result)   Collection Time: 06/30/16  6:53 AM  Result Value Ref Range Status   Specimen Description BLOOD  Final   Special Requests NONE  Final   Culture NO GROWTH < 12 HOURS  Final   Report Status PENDING  Incomplete  Blood Culture (routine x 2)     Status: None (Preliminary result)   Collection Time: 06/30/16  7:15 AM  Result Value Ref Range Status   Specimen Description BLOOD  Final   Special Requests NONE  Final   Culture NO GROWTH < 12 HOURS  Final   Report Status PENDING  Incomplete   Thank you for allowing pharmacy to be a part of this patient's care.  Valrie Hart A 06/30/2016 10:43 AM

## 2016-06-30 NOTE — Progress Notes (Signed)
1631 PRN Tylenol sup given as ordered for rectal temp 104.0, MD notified and made aware. Room temp dropped to coolest temp, blankets removed from patient and cooling blanket requested from [facility].

## 2016-07-01 DIAGNOSIS — A419 Sepsis, unspecified organism: Secondary | ICD-10-CM | POA: Diagnosis not present

## 2016-07-01 DIAGNOSIS — R509 Fever, unspecified: Secondary | ICD-10-CM | POA: Diagnosis not present

## 2016-07-01 DIAGNOSIS — N179 Acute kidney failure, unspecified: Secondary | ICD-10-CM | POA: Diagnosis not present

## 2016-07-01 DIAGNOSIS — N3000 Acute cystitis without hematuria: Secondary | ICD-10-CM | POA: Diagnosis not present

## 2016-07-01 LAB — CBC
HCT: 24.1 % — ABNORMAL LOW (ref 36.0–46.0)
Hemoglobin: 7.7 g/dL — ABNORMAL LOW (ref 12.0–15.0)
MCH: 30.4 pg (ref 26.0–34.0)
MCHC: 32 g/dL (ref 30.0–36.0)
MCV: 95.3 fL (ref 78.0–100.0)
Platelets: 350 10*3/uL (ref 150–400)
RBC: 2.53 MIL/uL — ABNORMAL LOW (ref 3.87–5.11)
RDW: 14.4 % (ref 11.5–15.5)
WBC: 12.9 10*3/uL — ABNORMAL HIGH (ref 4.0–10.5)

## 2016-07-01 LAB — BLOOD CULTURE ID PANEL (REFLEXED)
ACINETOBACTER BAUMANNII: NOT DETECTED
CARBAPENEM RESISTANCE: NOT DETECTED
Candida albicans: NOT DETECTED
Candida glabrata: NOT DETECTED
Candida krusei: NOT DETECTED
Candida parapsilosis: NOT DETECTED
Candida tropicalis: NOT DETECTED
ENTEROBACTER CLOACAE COMPLEX: NOT DETECTED
ENTEROBACTERIACEAE SPECIES: DETECTED — AB
ENTEROCOCCUS SPECIES: NOT DETECTED
Escherichia coli: NOT DETECTED
HAEMOPHILUS INFLUENZAE: NOT DETECTED
Klebsiella oxytoca: NOT DETECTED
Klebsiella pneumoniae: DETECTED — AB
Listeria monocytogenes: NOT DETECTED
METHICILLIN RESISTANCE: NOT DETECTED
NEISSERIA MENINGITIDIS: NOT DETECTED
PSEUDOMONAS AERUGINOSA: NOT DETECTED
Proteus species: NOT DETECTED
STAPHYLOCOCCUS AUREUS BCID: NOT DETECTED
STAPHYLOCOCCUS SPECIES: NOT DETECTED
STREPTOCOCCUS AGALACTIAE: NOT DETECTED
STREPTOCOCCUS SPECIES: NOT DETECTED
Serratia marcescens: NOT DETECTED
Streptococcus pneumoniae: NOT DETECTED
Streptococcus pyogenes: NOT DETECTED
VANCOMYCIN RESISTANCE: NOT DETECTED

## 2016-07-01 LAB — BASIC METABOLIC PANEL
ANION GAP: 8 (ref 5–15)
BUN: 16 mg/dL (ref 6–20)
CALCIUM: 8 mg/dL — AB (ref 8.9–10.3)
CHLORIDE: 106 mmol/L (ref 101–111)
CO2: 25 mmol/L (ref 22–32)
CREATININE: 0.9 mg/dL (ref 0.44–1.00)
GFR calc non Af Amer: >60 mL/min (ref >60)
Glucose, Bld: 123 mg/dL — ABNORMAL HIGH (ref 65–99)
Potassium: 4.1 mmol/L (ref 3.5–5.1)
SODIUM: 139 mmol/L (ref 135–145)

## 2016-07-01 MED ORDER — CARBIDOPA-LEVODOPA 25-100 MG PO TABS
ORAL_TABLET | ORAL | Status: AC
  Filled 2016-07-01: qty 2

## 2016-07-01 MED ORDER — NYSTATIN 100000 UNIT/GM EX POWD
Freq: Two times a day (BID) | CUTANEOUS | Status: DC
  Administered 2016-07-01 – 2016-07-05 (×8): via TOPICAL
  Filled 2016-07-01 (×3): qty 15

## 2016-07-01 MED ORDER — DEXTROSE 5 % IV SOLN
2.0000 g | INTRAVENOUS | Status: DC
  Filled 2016-07-01: qty 2

## 2016-07-01 MED ORDER — DEXTROSE 5 % IV SOLN
1.0000 g | Freq: Three times a day (TID) | INTRAVENOUS | Status: DC
  Administered 2016-07-01 – 2016-07-03 (×7): 1 g via INTRAVENOUS
  Filled 2016-07-01 (×14): qty 1

## 2016-07-01 MED ORDER — PIPERACILLIN-TAZOBACTAM 3.375 G IVPB
3.3750 g | Freq: Once | INTRAVENOUS | Status: DC
  Administered 2016-07-01: 3.375 g via INTRAVENOUS
  Filled 2016-07-01: qty 50

## 2016-07-01 NOTE — Progress Notes (Signed)
CRITICAL VALUE ALERT  Critical value received:  Blood cultures positive for gram negative rods  Date of notification:  06/30/16  Time of notification:  2315  Critical value read back:Yes.    Nurse who received alert:  C. Channell Quattrone, RN  MD notified (1st page):  Schorr  Time of first page:  2320  MD notified (2nd page):Lama  Time of second page:  Responding MD:  Sharl Ma  Time MD responded:

## 2016-07-01 NOTE — Care Management Note (Signed)
Case Management Note  Patient Details  Name: Mariela Kuzia MRN: [id number] Date of Birth: 04/25/1941  Subjective/Objective: Patient adm from home, recently finished home health services with [facility] which may need to be resumed at discharge. Patient with recurrent UTI, cultures pending. May be appropriate for palliative consult. Palliative services unavailable rest of this week.            Action/Plan: Anticipate DC home with home health services at minimal. Will follow.   Expected Discharge Date:                  Expected Discharge Plan:  Home w Home Health Services  In-House Referral:     Discharge planning Services  CM Consult  Post Acute Care Choice:  NA Choice offered to:  NA  DME Arranged:    DME Agency:     HH Arranged:    HH Agency:     Status of Service:  In process, will continue to follow  If discussed at Long Length of Stay Meetings, dates discussed:    Additional Comments:  Keosha Rossa, Chrystine Oiler, RN 07/01/2016, 11:03 AM

## 2016-07-01 NOTE — Progress Notes (Signed)
PROGRESS NOTE    Carol Mckee  RUE:[id number] DOB: 08/30/1941 DOA: 06/30/2016 PCP: Selinda Flavin, MD     Brief Narrative:  75 year old woman admitted on 7/26 from home with complaints of fever. Found to have a UTI. Blood cultures have subsequently been positive for Klebsiella.   Assessment & Plan:   Principal Problem:   Fever Active Problems:   UTI (urinary tract infection)   Parkinson's disease (HCC)   Sepsis (HCC)   Acute renal failure (ARF) (HCC)   Leukocytosis   Klebsiella septicemia due to Klebsiella UTI -Continue cefepime pending culture data. -Patient had similar presentation less than a month ago and was treated with Rocephin. -We'll discuss in a.m. with infectious diseases length of treatment and potential further workup given recurrence of Klebsiella bacteremia.  Acute renal failure -Resolved with IV fluids.  Parkinson's disease -Continue home medications.   DVT prophylaxis: Lovenox Code Status: Full code Family Communication: Husband at bedside updated on plan of care and all questions answered Disposition Plan: To be determined  Consultants:   None  Procedures:   None  Antimicrobials:   Cefepime    Subjective: No acute issues or complaints  Objective: Vitals:   07/01/16 0630 07/01/16 1338 07/01/16 1429 07/01/16 1630  BP: (!) 125/52 133/60    Pulse: 78 83    Resp: 15 18    Temp: 97.8 F (36.6 C) 100.1 F (37.8 C) 98.6 F (37 C) 99.4 F (37.4 C)  TempSrc: Oral Rectal Rectal Rectal  SpO2: 94% 95%    Weight:      Height:        Intake/Output Summary (Last 24 hours) at 07/01/16 1742 Last data filed at 07/01/16 0459  Gross per 24 hour  Intake               50 ml  Output                0 ml  Net               50 ml   Filed Weights   06/30/16 0637 06/30/16 0829  Weight: 95.3 kg (210 lb) 95.3 kg (210 lb)    Examination:  General exam: Alert, awake, oriented x 3 Respiratory system: Clear to auscultation. Respiratory effort  normal. Cardiovascular system:RRR. No murmurs, rubs, gallops. Gastrointestinal system: Abdomen is nondistended, soft and nontender. No organomegaly or masses felt. Normal bowel sounds heard. Central nervous system: Alert and oriented. No focal neurological deficits. Extremities: No C/C/E, +pedal pulses Skin: No rashes, lesions or ulcers    Data Reviewed: I have personally reviewed following labs and imaging studies  CBC:  Recent Labs Lab 06/30/16 0653 07/01/16 0441  WBC 16.8* 12.9*  NEUTROABS 14.0*  --   HGB 8.7* 7.7*  HCT 27.1* 24.1*  MCV 95.1 95.3  PLT 422* 350   Basic Metabolic Panel:  Recent Labs Lab 06/30/16 0653 07/01/16 0441  NA 135 139  K 4.6 4.1  CL 99* 106  CO2 27 25  GLUCOSE 146* 123*  BUN 16 16  CREATININE 1.12* 0.90  CALCIUM 8.3* 8.0*   GFR: Estimated Creatinine Clearance: 59.7 mL/min (by C-G formula based on SCr of 0.9 mg/dL). Liver Function Tests:  Recent Labs Lab 06/30/16 0653  AST 18  ALT <5*  ALKPHOS 72  BILITOT 0.6  PROT 7.4  ALBUMIN 2.7*   No results for input(s): LIPASE, AMYLASE in the last 168 hours. No results for input(s): AMMONIA in the last 168 hours. Coagulation Profile: No  results for input(s): INR, PROTIME in the last 168 hours. Cardiac Enzymes:  Recent Labs Lab 06/30/16 0653  TROPONINI <0.03   BNP (last 3 results) No results for input(s): PROBNP in the last 8760 hours. HbA1C: No results for input(s): HGBA1C in the last 72 hours. CBG: No results for input(s): GLUCAP in the last 168 hours. Lipid Profile: No results for input(s): CHOL, HDL, LDLCALC, TRIG, CHOLHDL, LDLDIRECT in the last 72 hours. Thyroid Function Tests: No results for input(s): TSH, T4TOTAL, FREET4, T3FREE, THYROIDAB in the last 72 hours. Anemia Panel: No results for input(s): VITAMINB12, FOLATE, FERRITIN, TIBC, IRON, RETICCTPCT in the last 72 hours. Urine analysis:    Component Value Date/Time   COLORURINE YELLOW 06/30/2016 0733   APPEARANCEUR  CLEAR 06/30/2016 0733   LABSPEC 1.010 06/30/2016 0733   PHURINE 7.0 06/30/2016 0733   GLUCOSEU NEGATIVE 06/30/2016 0733   HGBUR NEGATIVE 06/30/2016 0733   BILIRUBINUR NEGATIVE 06/30/2016 0733   KETONESUR NEGATIVE 06/30/2016 0733   PROTEINUR TRACE (A) 06/30/2016 0733   UROBILINOGEN 0.2 04/19/2012 1532   NITRITE POSITIVE (A) 06/30/2016 0733   LEUKOCYTESUR TRACE (A) 06/30/2016 0733   Sepsis Labs: @LABRCNTIP (procalcitonin:4,lacticidven:4)  ) Recent Results (from the past 240 hour(s))  Blood Culture (routine x 2)     Status: Abnormal (Preliminary result)   Collection Time: 06/30/16  6:53 AM  Result Value Ref Range Status   Specimen Description BLOOD RIGHT FOREARM DRAWN BY RN  Final   Special Requests   Final    BOTTLES DRAWN AEROBIC AND ANAEROBIC AEB=7CC ANA=5CC   Culture  Setup Time   Final    GRAM NEGATIVE RODS Gram Stain Report Called to,Read Back By and Verified With: BUSICK,C ON 06/30/16 AT 2315 BY LOY,C PERFORMED AT [facility] CRITICAL RESULT CALLED TO, READ BACK BY AND VERIFIED WITH: CHRISTY BUSICK,RN @0530  07/01/16 MKELLY IN BOTH AEROBIC AND ANAEROBIC BOTTLES Performed at [facility]    Culture KLEBSIELLA PNEUMONIAE (A)  Final   Report Status PENDING  Incomplete  Blood Culture ID Panel (Reflexed)     Status: Abnormal   Collection Time: 06/30/16  6:53 AM  Result Value Ref Range Status   Enterococcus species NOT DETECTED NOT DETECTED Final   Vancomycin resistance NOT DETECTED NOT DETECTED Final   Listeria monocytogenes NOT DETECTED NOT DETECTED Final   Staphylococcus species NOT DETECTED NOT DETECTED Final   Staphylococcus aureus NOT DETECTED NOT DETECTED Final   Methicillin resistance NOT DETECTED NOT DETECTED Final   Streptococcus species NOT DETECTED NOT DETECTED Final   Streptococcus agalactiae NOT DETECTED NOT DETECTED Final   Streptococcus pneumoniae NOT DETECTED NOT DETECTED Final   Streptococcus pyogenes NOT DETECTED NOT DETECTED Final   Acinetobacter baumannii  NOT DETECTED NOT DETECTED Final   Enterobacteriaceae species DETECTED (A) NOT DETECTED Final    Comment: CRITICAL RESULT CALLED TO, READ BACK BY AND VERIFIED WITH: C BUSICK,RN @0529  07/01/16 MKELLY    Enterobacter cloacae complex NOT DETECTED NOT DETECTED Final   Escherichia coli NOT DETECTED NOT DETECTED Final   Klebsiella oxytoca NOT DETECTED NOT DETECTED Final   Klebsiella pneumoniae DETECTED (A) NOT DETECTED Final    Comment: CRITICAL RESULT CALLED TO, READ BACK BY AND VERIFIED WITH: C BUSICK,RN @0529  07/01/16 MKELLY    Proteus species NOT DETECTED NOT DETECTED Final   Serratia marcescens NOT DETECTED NOT DETECTED Final   Carbapenem resistance NOT DETECTED NOT DETECTED Final   Haemophilus influenzae NOT DETECTED NOT DETECTED Final   Neisseria meningitidis NOT DETECTED NOT DETECTED Final  Pseudomonas aeruginosa NOT DETECTED NOT DETECTED Final   Candida albicans NOT DETECTED NOT DETECTED Final   Candida glabrata NOT DETECTED NOT DETECTED Final   Candida krusei NOT DETECTED NOT DETECTED Final   Candida parapsilosis NOT DETECTED NOT DETECTED Final   Candida tropicalis NOT DETECTED NOT DETECTED Final    Comment: Performed at [facility]  Blood Culture (routine x 2)     Status: None (Preliminary result)   Collection Time: 06/30/16  7:15 AM  Result Value Ref Range Status   Specimen Description BLOOD RIGHT FOREARM DRAWN BY RN LR  Final   Special Requests BOTTLES DRAWN AEROBIC AND ANAEROBIC 10CC EACH  Final   Culture  Setup Time   Final    GRAM NEGATIVE RODS Gram Stain Report Called to,Read Back By and Verified With: BUSICK,C  ON 06/30/16 AT 2315 BY LOY,C PERFORMED AT [facility] IN BOTH AEROBIC AND ANAEROBIC BOTTLES Performed at [facility]    Culture GRAM NEGATIVE RODS  Final   Report Status PENDING  Incomplete  Urine culture     Status: Abnormal (Preliminary result)   Collection Time: 06/30/16  7:30 AM  Result Value Ref Range Status   Specimen Description URINE,  CATHETERIZED  Final   Special Requests NONE  Final   Culture >=100,000 COLONIES/mL GRAM NEGATIVE RODS (A)  Final   Report Status PENDING  Incomplete         Radiology Studies: Dg Chest Port 1 View  Result Date: 06/30/2016 CLINICAL DATA:  Fever EXAM: PORTABLE CHEST 1 VIEW COMPARISON:  June 29, 2016 FINDINGS: There is no edema or consolidation. Heart size and pulmonary vascularity are within normal limits. There is atherosclerotic calcification in the aorta. No adenopathy. There is degenerative change in each shoulder and in the thoracic spine. IMPRESSION: There is no edema or consolidation. There is aortic atherosclerosis. Electronically Signed   By: Bretta Bang III M.D.   On: 06/30/2016 07:09       Scheduled Meds: . antiseptic oral rinse  7 mL Mouth Rinse BID  . baclofen  10 mg Oral Daily  . carbidopa-levodopa  1.5 tablet Oral 6 X Daily  . ceFEPime (MAXIPIME) IV  1 g Intravenous Q8H  . diazepam  2.5 mg Oral Daily  . DULoxetine  30 mg Oral Daily  . enoxaparin (LOVENOX) injection  40 mg Subcutaneous Q24H  . ferrous sulfate  325 mg Oral Q breakfast  . HYDROcodone-acetaminophen  0.5 tablet Oral 5 times per day  . HYDROcodone-acetaminophen  1 tablet Oral Q0600  . magnesium oxide  400 mg Oral Daily  . polyethylene glycol  17 g Oral Daily   Continuous Infusions: . sodium chloride 75 mL/hr at 06/30/16 1305     LOS: 0 days    Time spent: 25 minutes. Greater than 50% of this time was spent in direct contact with the patient coordinating care.     Chaya Jan, MD [facility] Pager [phone]  If 7PM-7AM, please contact night-coverage [email or web address] Password [email or web address] 07/01/2016, 5:42 PM

## 2016-07-01 NOTE — Progress Notes (Addendum)
ANTIBIOTIC CONSULT NOTE-Preliminary  Pharmacy Consult for Zosyn Indication: Fever  Allergies  Allergen Reactions  . Azithromycin Diarrhea  . Lyrica [Pregabalin]     Drops blood pressure  . Tizanidine Other (See Comments)    hypotension  . Erythromycin Diarrhea    Patient Measurements: Height: 5\' 5"  (165.1 cm) Weight: 210 lb (95.3 kg) IBW/kg (Calculated) : 57   Vital Signs: Temp: 102.7 F (39.3 C) (07/27 0347) Temp Source: Rectal (07/27 0347) BP: 144/53 (07/26 2153) Pulse Rate: 77 (07/26 2153)  Labs:  Recent Labs  06/30/16 0653  WBC 16.8*  HGB 8.7*  PLT 422*  CREATININE 1.12*    Estimated Creatinine Clearance: 48 mL/min (by C-G formula based on SCr of 1.12 mg/dL).  No results for input(s): VANCOTROUGH, VANCOPEAK, VANCORANDOM, GENTTROUGH, GENTPEAK, GENTRANDOM, TOBRATROUGH, TOBRAPEAK, TOBRARND, AMIKACINPEAK, AMIKACINTROU, AMIKACIN in the last 72 hours.   Microbiology: Recent Results (from the past 720 hour(s))  Blood Culture (routine x 2)     Status: None (Preliminary result)   Collection Time: 06/30/16  6:53 AM  Result Value Ref Range Status   Specimen Description BLOOD RIGHT FOREARM DRAWN BY RN  Final   Special Requests   Final    BOTTLES DRAWN AEROBIC AND ANAEROBIC AEB=7CC ANA=5CC   Culture  Setup Time   Final    GRAM NEGATIVE RODS Gram Stain Report Called to,Read Back By and Verified With: BUSICK,C ON 06/30/16 AT 2315 BY LOY,C PERFORMED AT [facility] Organism ID to follow Performed at [facility]    Culture NO GROWTH < 12 HOURS  Final   Report Status PENDING  Incomplete  Blood Culture (routine x 2)     Status: None (Preliminary result)   Collection Time: 06/30/16  7:15 AM  Result Value Ref Range Status   Specimen Description BLOOD RIGHT FOREARM DRAWN BY RN LR  Final   Special Requests BOTTLES DRAWN AEROBIC AND ANAEROBIC 10CC EACH  Final   Culture  Setup Time   Final    GRAM NEGATIVE RODS Gram Stain Report Called to,Read Back By and Verified With:  BUSICK,C  ON 06/30/16 AT 2315 BY LOY,C PERFORMED AT [facility]    Culture NO GROWTH < 12 HOURS  Final   Report Status PENDING  Incomplete    Medical History: Past Medical History:  Diagnosis Date  . Abnormality of gait   . Edema   . Fatigue   . Other chronic pulmonary heart diseases   . Paralysis agitans (HCC)   . Parkinson disease (HCC)   . Parkinson's disease (HCC) 10/04/2013  . Spinal stenosis   . Stiffness of joint, not elsewhere classified,  shoulder region     Medications:  Ceftriaxone 1 Gm IV every 24 hours>>7/26>>  Assessment: 75 yo female admitted 06/30/16 for UTI. Pt has hx of recent hospitalization due to Klebsiella UTI and sepsis. Positive blood culture report received at this time with gram negative rods. Zosyn ordered empirically.  Goal of Therapy:  Eradicated infection  Plan:  Preliminary review of pertinent patient information completed.  Protocol will be initiated with a one-time dose of Zosyn 3.375 Gm IV.  Jeani Hawking clinical pharmacist will complete review during morning rounds to assess patient and finalize treatment regimen.  Arelia Sneddon, [facility] 07/01/2016,4:38 AM

## 2016-07-01 NOTE — Plan of Care (Signed)
Dr. Informed of 7.7 hemoglobin.

## 2016-07-01 NOTE — Care Management Obs Status (Signed)
MEDICARE OBSERVATION STATUS NOTIFICATION   Patient Details  Name: Aile Goulette MRN: [id number] Date of Birth: 1941-02-14   Medicare Observation Status Notification Given:  Yes    Shaquoia Miers, Chrystine Oiler, RN 07/01/2016, 9:56 AM

## 2016-07-01 NOTE — Progress Notes (Signed)
PHARMACY - PHYSICIAN COMMUNICATION CRITICAL VALUE ALERT - BLOOD CULTURE IDENTIFICATION (BCID)  Results for orders placed or performed during the hospital encounter of 06/30/16  Blood Culture ID Panel (Reflexed) (Collected: 06/30/2016  6:53 AM)  Result Value Ref Range   Enterococcus species NOT DETECTED NOT DETECTED   Vancomycin resistance NOT DETECTED NOT DETECTED   Listeria monocytogenes NOT DETECTED NOT DETECTED   Staphylococcus species NOT DETECTED NOT DETECTED   Staphylococcus aureus NOT DETECTED NOT DETECTED   Methicillin resistance NOT DETECTED NOT DETECTED   Streptococcus species NOT DETECTED NOT DETECTED   Streptococcus agalactiae NOT DETECTED NOT DETECTED   Streptococcus pneumoniae NOT DETECTED NOT DETECTED   Streptococcus pyogenes NOT DETECTED NOT DETECTED   Acinetobacter baumannii NOT DETECTED NOT DETECTED   Enterobacteriaceae species DETECTED (A) NOT DETECTED   Enterobacter cloacae complex NOT DETECTED NOT DETECTED   Escherichia coli NOT DETECTED NOT DETECTED   Klebsiella oxytoca NOT DETECTED NOT DETECTED   Klebsiella pneumoniae DETECTED (A) NOT DETECTED   Proteus species NOT DETECTED NOT DETECTED   Serratia marcescens NOT DETECTED NOT DETECTED   Carbapenem resistance NOT DETECTED NOT DETECTED   Haemophilus influenzae NOT DETECTED NOT DETECTED   Neisseria meningitidis NOT DETECTED NOT DETECTED   Pseudomonas aeruginosa NOT DETECTED NOT DETECTED   Candida albicans NOT DETECTED NOT DETECTED   Candida glabrata NOT DETECTED NOT DETECTED   Candida krusei NOT DETECTED NOT DETECTED   Candida parapsilosis NOT DETECTED NOT DETECTED   Candida tropicalis NOT DETECTED NOT DETECTED    Name of physician (or Provider) Contacted: Dr Ardyth Harps  Changes to prescribed antibiotics required: D/C rocephin and zosyn.  Start cefepime  Carol Mckee 07/01/2016  8:06 AM

## 2016-07-02 ENCOUNTER — Observation Stay (HOSPITAL_COMMUNITY): Payer: Medicare Other

## 2016-07-02 DIAGNOSIS — N179 Acute kidney failure, unspecified: Secondary | ICD-10-CM | POA: Diagnosis present

## 2016-07-02 DIAGNOSIS — Z881 Allergy status to other antibiotic agents status: Secondary | ICD-10-CM | POA: Diagnosis not present

## 2016-07-02 DIAGNOSIS — R7881 Bacteremia: Secondary | ICD-10-CM | POA: Diagnosis not present

## 2016-07-02 DIAGNOSIS — Z820 Family history of epilepsy and other diseases of the nervous system: Secondary | ICD-10-CM | POA: Diagnosis not present

## 2016-07-02 DIAGNOSIS — G8929 Other chronic pain: Secondary | ICD-10-CM | POA: Diagnosis present

## 2016-07-02 DIAGNOSIS — D72829 Elevated white blood cell count, unspecified: Secondary | ICD-10-CM | POA: Diagnosis not present

## 2016-07-02 DIAGNOSIS — A4159 Other Gram-negative sepsis: Secondary | ICD-10-CM | POA: Diagnosis present

## 2016-07-02 DIAGNOSIS — Z87442 Personal history of urinary calculi: Secondary | ICD-10-CM | POA: Diagnosis not present

## 2016-07-02 DIAGNOSIS — Z888 Allergy status to other drugs, medicaments and biological substances status: Secondary | ICD-10-CM | POA: Diagnosis not present

## 2016-07-02 DIAGNOSIS — N3000 Acute cystitis without hematuria: Secondary | ICD-10-CM | POA: Diagnosis not present

## 2016-07-02 DIAGNOSIS — Z79891 Long term (current) use of opiate analgesic: Secondary | ICD-10-CM | POA: Diagnosis not present

## 2016-07-02 DIAGNOSIS — B961 Klebsiella pneumoniae [K. pneumoniae] as the cause of diseases classified elsewhere: Secondary | ICD-10-CM | POA: Diagnosis present

## 2016-07-02 DIAGNOSIS — Z79899 Other long term (current) drug therapy: Secondary | ICD-10-CM | POA: Diagnosis not present

## 2016-07-02 DIAGNOSIS — N10 Acute tubulo-interstitial nephritis: Secondary | ICD-10-CM | POA: Diagnosis not present

## 2016-07-02 DIAGNOSIS — N136 Pyonephrosis: Secondary | ICD-10-CM | POA: Diagnosis present

## 2016-07-02 DIAGNOSIS — G4733 Obstructive sleep apnea (adult) (pediatric): Secondary | ICD-10-CM | POA: Diagnosis present

## 2016-07-02 DIAGNOSIS — Z87440 Personal history of urinary (tract) infections: Secondary | ICD-10-CM | POA: Diagnosis not present

## 2016-07-02 DIAGNOSIS — G20 Parkinson's disease: Secondary | ICD-10-CM | POA: Diagnosis present

## 2016-07-02 DIAGNOSIS — M549 Dorsalgia, unspecified: Secondary | ICD-10-CM | POA: Diagnosis present

## 2016-07-02 DIAGNOSIS — R509 Fever, unspecified: Secondary | ICD-10-CM | POA: Diagnosis present

## 2016-07-02 DIAGNOSIS — D638 Anemia in other chronic diseases classified elsewhere: Secondary | ICD-10-CM | POA: Diagnosis present

## 2016-07-02 DIAGNOSIS — I279 Pulmonary heart disease, unspecified: Secondary | ICD-10-CM | POA: Diagnosis present

## 2016-07-02 DIAGNOSIS — N201 Calculus of ureter: Secondary | ICD-10-CM | POA: Diagnosis present

## 2016-07-02 DIAGNOSIS — A419 Sepsis, unspecified organism: Secondary | ICD-10-CM | POA: Diagnosis not present

## 2016-07-02 LAB — URINE CULTURE: Culture: >=100000 — AB

## 2016-07-02 NOTE — Care Management Note (Signed)
Case Management Note  Patient Details  Name: Carol Mckee MRN: [id number] Date of Birth: September 14, 1941    Additional Comments: Spoke with patient's husband about home health. He is agreeable to having home health re-established with [facility].  Therisa Doyne of [facility] notified and will obtain information from chart. Patient is aware [facility] has 48 hours to make first visit.   Regena Delucchi, Chrystine Oiler, RN 07/02/2016, 2:08 PM

## 2016-07-02 NOTE — Progress Notes (Signed)
PROGRESS NOTE    Carol Mckee  ZOX:[id number] DOB: [birth year] DOA: 06/30/2016 PCP: Selinda Flavin, MD     Brief Narrative:  75 year old woman admitted on 7/26 from home with complaints of fever. Found to have a UTI. Blood cultures have subsequently been positive for Klebsiella.   Assessment & Plan:   Principal Problem:   Fever Active Problems:   UTI (urinary tract infection)   Parkinson's disease (HCC)   Sepsis (HCC)   Acute renal failure (ARF) (HCC)   Leukocytosis   Klebsiella septicemia due to Klebsiella UTI -Continue cefepime pending culture data. -Patient had similar presentation less than a month ago and was treated with Rocephin. -Given rapid recurrence of Klebsiella UTI and bacteremia, I wonder if she has colonization of Klebsiella in her GU tract. Will request CT abdomen and pelvis to look for nephrolithiasis. -Case discussed via phone with infectious diseases, Dr. Jerolyn Center. Recommendation to continue cefepime pending sensitivities. -She seems to have defervesced.  Acute renal failure -Resolved with IV fluids.  Parkinson's disease -Continue home medications.   DVT prophylaxis: Lovenox Code Status: Full code Family Communication: Husband at bedside and daughter via phone updated on plan of care and all questions answered Disposition Plan: To be determined  Consultants:   None  Procedures:   None  Antimicrobials:   Cefepime    Subjective: No acute issues or complaints other than feeling cold. Objective: Vitals:   07/01/16 2141 07/02/16 0300 07/02/16 0429 07/02/16 1354  BP: (!) 137/51 (!) 160/80 (!) 160/80 (!) 105/52  Pulse: (!) 107  82 83  Resp: 18 20 20 15   Temp: 99.7 F (37.6 C) 100 F (37.8 C) 100 F (37.8 C) 98.8 F (37.1 C)  TempSrc: Rectal Rectal Rectal Rectal  SpO2: (!) 82%  97%   Weight:      Height:        Intake/Output Summary (Last 24 hours) at 07/02/16 1518 Last data filed at 07/02/16 1400  Gross per 24 hour  Intake              1875 ml  Output                0 ml  Net             1875 ml   Filed Weights   06/30/16 0637 06/30/16 0829  Weight: 95.3 kg (210 lb) 95.3 kg (210 lb)    Examination:  General exam: Alert, awake, oriented x 3 Respiratory system: Clear to auscultation. Respiratory effort normal. Cardiovascular system:RRR. No murmurs, rubs, gallops. Gastrointestinal system: Abdomen is nondistended, soft and nontender. No organomegaly or masses felt. Normal bowel sounds heard. Central nervous system: Alert, awake, nonfocal, nonintentional tremor of bilateral upper extremities. Extremities: No C/C/E, +pedal pulses Skin: No rashes, lesions or ulcers    Data Reviewed: I have personally reviewed following labs and imaging studies  CBC:  Recent Labs Lab 06/30/16 0653 07/01/16 0441  WBC 16.8* 12.9*  NEUTROABS 14.0*  --   HGB 8.7* 7.7*  HCT 27.1* 24.1*  MCV 95.1 95.3  PLT 422* 350   Basic Metabolic Panel:  Recent Labs Lab 06/30/16 0653 07/01/16 0441  NA 135 139  K 4.6 4.1  CL 99* 106  CO2 27 25  GLUCOSE 146* 123*  BUN 16 16  CREATININE 1.12* 0.90  CALCIUM 8.3* 8.0*   GFR: Estimated Creatinine Clearance: 59.7 mL/min (by C-G formula based on SCr of 0.9 mg/dL). Liver Function Tests:  Recent Labs Lab 06/30/16 0653  AST  18  ALT <5*  ALKPHOS 72  BILITOT 0.6  PROT 7.4  ALBUMIN 2.7*   No results for input(s): LIPASE, AMYLASE in the last 168 hours. No results for input(s): AMMONIA in the last 168 hours. Coagulation Profile: No results for input(s): INR, PROTIME in the last 168 hours. Cardiac Enzymes:  Recent Labs Lab 06/30/16 0653  TROPONINI <0.03   BNP (last 3 results) No results for input(s): PROBNP in the last 8760 hours. HbA1C: No results for input(s): HGBA1C in the last 72 hours. CBG: No results for input(s): GLUCAP in the last 168 hours. Lipid Profile: No results for input(s): CHOL, HDL, LDLCALC, TRIG, CHOLHDL, LDLDIRECT in the last 72 hours. Thyroid  Function Tests: No results for input(s): TSH, T4TOTAL, FREET4, T3FREE, THYROIDAB in the last 72 hours. Anemia Panel: No results for input(s): VITAMINB12, FOLATE, FERRITIN, TIBC, IRON, RETICCTPCT in the last 72 hours. Urine analysis:    Component Value Date/Time   COLORURINE YELLOW 06/30/2016 0733   APPEARANCEUR CLEAR 06/30/2016 0733   LABSPEC 1.010 06/30/2016 0733   PHURINE 7.0 06/30/2016 0733   GLUCOSEU NEGATIVE 06/30/2016 0733   HGBUR NEGATIVE 06/30/2016 0733   BILIRUBINUR NEGATIVE 06/30/2016 0733   KETONESUR NEGATIVE 06/30/2016 0733   PROTEINUR TRACE (A) 06/30/2016 0733   UROBILINOGEN 0.2 04/19/2012 1532   NITRITE POSITIVE (A) 06/30/2016 0733   LEUKOCYTESUR TRACE (A) 06/30/2016 0733   Sepsis Labs: @LABRCNTIP (procalcitonin:4,lacticidven:4)  ) Recent Results (from the past 240 hour(s))  Blood Culture (routine x 2)     Status: Abnormal (Preliminary result)   Collection Time: 06/30/16  6:53 AM  Result Value Ref Range Status   Specimen Description BLOOD RIGHT FOREARM DRAWN BY RN  Final   Special Requests   Final    BOTTLES DRAWN AEROBIC AND ANAEROBIC AEB=7CC ANA=5CC   Culture  Setup Time   Final    GRAM NEGATIVE RODS Gram Stain Report Called to,Read Back By and Verified With: BUSICK,C ON 06/30/16 AT 2315 BY LOY,C PERFORMED AT [facility] CRITICAL RESULT CALLED TO, READ BACK BY AND VERIFIED WITH: CHRISTY BUSICK,RN @0530  07/01/16 MKELLY IN BOTH AEROBIC AND ANAEROBIC BOTTLES Performed at [facility]    Culture KLEBSIELLA PNEUMONIAE (A)  Final   Report Status PENDING  Incomplete  Blood Culture ID Panel (Reflexed)     Status: Abnormal   Collection Time: 06/30/16  6:53 AM  Result Value Ref Range Status   Enterococcus species NOT DETECTED NOT DETECTED Final   Vancomycin resistance NOT DETECTED NOT DETECTED Final   Listeria monocytogenes NOT DETECTED NOT DETECTED Final   Staphylococcus species NOT DETECTED NOT DETECTED Final   Staphylococcus aureus NOT DETECTED NOT DETECTED Final    Methicillin resistance NOT DETECTED NOT DETECTED Final   Streptococcus species NOT DETECTED NOT DETECTED Final   Streptococcus agalactiae NOT DETECTED NOT DETECTED Final   Streptococcus pneumoniae NOT DETECTED NOT DETECTED Final   Streptococcus pyogenes NOT DETECTED NOT DETECTED Final   Acinetobacter baumannii NOT DETECTED NOT DETECTED Final   Enterobacteriaceae species DETECTED (A) NOT DETECTED Final    Comment: CRITICAL RESULT CALLED TO, READ BACK BY AND VERIFIED WITH: C BUSICK,RN @0529  07/01/16 MKELLY    Enterobacter cloacae complex NOT DETECTED NOT DETECTED Final   Escherichia coli NOT DETECTED NOT DETECTED Final   Klebsiella oxytoca NOT DETECTED NOT DETECTED Final   Klebsiella pneumoniae DETECTED (A) NOT DETECTED Final    Comment: CRITICAL RESULT CALLED TO, READ BACK BY AND VERIFIED WITH: C BUSICK,RN @0529  07/01/16 MKELLY    Proteus species  NOT DETECTED NOT DETECTED Final   Serratia marcescens NOT DETECTED NOT DETECTED Final   Carbapenem resistance NOT DETECTED NOT DETECTED Final   Haemophilus influenzae NOT DETECTED NOT DETECTED Final   Neisseria meningitidis NOT DETECTED NOT DETECTED Final   Pseudomonas aeruginosa NOT DETECTED NOT DETECTED Final   Candida albicans NOT DETECTED NOT DETECTED Final   Candida glabrata NOT DETECTED NOT DETECTED Final   Candida krusei NOT DETECTED NOT DETECTED Final   Candida parapsilosis NOT DETECTED NOT DETECTED Final   Candida tropicalis NOT DETECTED NOT DETECTED Final    Comment: Performed at [facility]  Blood Culture (routine x 2)     Status: None (Preliminary result)   Collection Time: 06/30/16  7:15 AM  Result Value Ref Range Status   Specimen Description BLOOD RIGHT FOREARM DRAWN BY RN LR  Final   Special Requests BOTTLES DRAWN AEROBIC AND ANAEROBIC 10CC EACH  Final   Culture  Setup Time   Final    GRAM NEGATIVE RODS Gram Stain Report Called to,Read Back By and Verified With: BUSICK,C  ON 06/30/16 AT 2315 BY LOY,C PERFORMED AT  [facility] IN BOTH AEROBIC AND ANAEROBIC BOTTLES Performed at [facility]    Culture GRAM NEGATIVE RODS  Final   Report Status PENDING  Incomplete  Urine culture     Status: Abnormal   Collection Time: 06/30/16  7:30 AM  Result Value Ref Range Status   Specimen Description URINE, CATHETERIZED  Final   Special Requests NONE  Final   Culture >=100,000 COLONIES/mL KLEBSIELLA PNEUMONIAE (A)  Final   Report Status 07/02/2016 FINAL  Final   Organism ID, Bacteria KLEBSIELLA PNEUMONIAE (A)  Final      Susceptibility   Klebsiella pneumoniae - MIC*    AMPICILLIN >=32 RESISTANT Resistant     CEFAZOLIN <=4 SENSITIVE Sensitive     CEFTRIAXONE <=1 SENSITIVE Sensitive     CIPROFLOXACIN <=0.25 SENSITIVE Sensitive     GENTAMICIN <=1 SENSITIVE Sensitive     IMIPENEM <=0.25 SENSITIVE Sensitive     NITROFURANTOIN 128 RESISTANT Resistant     TRIMETH/SULFA <=20 SENSITIVE Sensitive     AMPICILLIN/SULBACTAM >=32 RESISTANT Resistant     PIP/TAZO 64 INTERMEDIATE Intermediate     * >=100,000 COLONIES/mL KLEBSIELLA PNEUMONIAE         Radiology Studies: No results found.      Scheduled Meds: . antiseptic oral rinse  7 mL Mouth Rinse BID  . baclofen  10 mg Oral Daily  . carbidopa-levodopa  1.5 tablet Oral 6 X Daily  . ceFEPime (MAXIPIME) IV  1 g Intravenous Q8H  . diazepam  2.5 mg Oral Daily  . DULoxetine  30 mg Oral Daily  . enoxaparin (LOVENOX) injection  40 mg Subcutaneous Q24H  . ferrous sulfate  325 mg Oral Q breakfast  . HYDROcodone-acetaminophen  0.5 tablet Oral 5 times per day  . HYDROcodone-acetaminophen  1 tablet Oral Q0600  . magnesium oxide  400 mg Oral Daily  . nystatin   Topical BID  . polyethylene glycol  17 g Oral Daily   Continuous Infusions: . sodium chloride 75 mL/hr at 07/02/16 0828     LOS: 0 days    Time spent: 25 minutes. Greater than 50% of this time was spent in direct contact with the patient coordinating care.     Chaya Jan, MD [facility] Pager [phone]  If 7PM-7AM, please contact night-coverage [email or web address] Password [facility] 07/02/2016, 3:18 PM

## 2016-07-03 ENCOUNTER — Inpatient Hospital Stay (HOSPITAL_COMMUNITY): Payer: Medicare Other | Admitting: Certified Registered Nurse Anesthetist

## 2016-07-03 ENCOUNTER — Encounter (HOSPITAL_COMMUNITY): Payer: Self-pay | Admitting: Anesthesiology

## 2016-07-03 ENCOUNTER — Encounter (HOSPITAL_COMMUNITY)
Admission: EM | Disposition: A | Discharge status: Home Health | Payer: Self-pay | Source: Home / Self Care | Attending: Internal Medicine

## 2016-07-03 HISTORY — PX: CYSTOSCOPY WITH STENT PLACEMENT: SHX5790

## 2016-07-03 LAB — BASIC METABOLIC PANEL
ANION GAP: 3 — AB (ref 5–15)
BUN: 14 mg/dL (ref 6–20)
CALCIUM: 7.8 mg/dL — AB (ref 8.9–10.3)
CO2: 26 mmol/L (ref 22–32)
Chloride: 106 mmol/L (ref 101–111)
Creatinine, Ser: 0.69 mg/dL (ref 0.44–1.00)
Glucose, Bld: 122 mg/dL — ABNORMAL HIGH (ref 65–99)
POTASSIUM: 3.7 mmol/L (ref 3.5–5.1)
Sodium: 135 mmol/L (ref 135–145)

## 2016-07-03 LAB — CBC
HCT: 25.8 % — ABNORMAL LOW (ref 36.0–46.0)
HEMOGLOBIN: 8.3 g/dL — AB (ref 12.0–15.0)
MCH: 30 pg (ref 26.0–34.0)
MCHC: 32.2 g/dL (ref 30.0–36.0)
MCV: 93.1 fL (ref 78.0–100.0)
Platelets: 354 10*3/uL (ref 150–400)
RBC: 2.77 MIL/uL — AB (ref 3.87–5.11)
RDW: 14.5 % (ref 11.5–15.5)
WBC: 9.3 10*3/uL (ref 4.0–10.5)

## 2016-07-03 LAB — CULTURE, BLOOD (ROUTINE X 2)

## 2016-07-03 SURGERY — CYSTOSCOPY, WITH STENT INSERTION
Anesthesia: General | Laterality: Left

## 2016-07-03 MED ORDER — CEFTRIAXONE SODIUM IN DEXTROSE 40 MG/ML IV SOLN
1.0000 g | INTRAVENOUS | Status: DC
  Administered 2016-07-03 – 2016-07-04 (×2): 1 g via INTRAVENOUS
  Filled 2016-07-03 (×4): qty 50

## 2016-07-03 MED ORDER — FENTANYL CITRATE (PF) 100 MCG/2ML IJ SOLN
INTRAMUSCULAR | Status: DC | PRN
  Administered 2016-07-03 (×2): 50 ug via INTRAVENOUS

## 2016-07-03 MED ORDER — PROMETHAZINE HCL 25 MG/ML IJ SOLN: 6.2500 mg | INTRAMUSCULAR | Status: DC | PRN

## 2016-07-03 MED ORDER — ONDANSETRON HCL 4 MG/2ML IJ SOLN
INTRAMUSCULAR | Status: DC | PRN
  Administered 2016-07-03: 4 mg via INTRAVENOUS

## 2016-07-03 MED ORDER — LIDOCAINE HCL (CARDIAC) 20 MG/ML IV SOLN
INTRAVENOUS | Status: AC
  Filled 2016-07-03: qty 5

## 2016-07-03 MED ORDER — FENTANYL CITRATE (PF) 100 MCG/2ML IJ SOLN
INTRAMUSCULAR | Status: AC
  Filled 2016-07-03: qty 2

## 2016-07-03 MED ORDER — SUCCINYLCHOLINE CHLORIDE 200 MG/10ML IV SOSY
PREFILLED_SYRINGE | INTRAVENOUS | Status: DC | PRN
  Administered 2016-07-03: 100 mg via INTRAVENOUS

## 2016-07-03 MED ORDER — LIDOCAINE HCL (CARDIAC) 20 MG/ML IV SOLN
INTRAVENOUS | Status: DC | PRN
  Administered 2016-07-03: 50 mg via INTRAVENOUS

## 2016-07-03 MED ORDER — PROPOFOL 10 MG/ML IV BOLUS
INTRAVENOUS | Status: AC
  Filled 2016-07-03: qty 20

## 2016-07-03 MED ORDER — PHENYLEPHRINE 40 MCG/ML (10ML) SYRINGE FOR IV PUSH (FOR BLOOD PRESSURE SUPPORT)
PREFILLED_SYRINGE | INTRAVENOUS | Status: DC | PRN
  Administered 2016-07-03: 80 ug via INTRAVENOUS

## 2016-07-03 MED ORDER — SODIUM CHLORIDE 0.9 % IV SOLN
INTRAVENOUS | Status: DC | PRN
  Administered 2016-07-03: 7 mL

## 2016-07-03 MED ORDER — ONDANSETRON HCL 4 MG/2ML IJ SOLN
INTRAMUSCULAR | Status: AC
  Filled 2016-07-03: qty 2

## 2016-07-03 MED ORDER — SODIUM CHLORIDE 0.9 % IR SOLN
Status: DC | PRN
  Administered 2016-07-03: 1000 mL via INTRAVESICAL

## 2016-07-03 MED ORDER — PROPOFOL 10 MG/ML IV BOLUS
INTRAVENOUS | Status: DC | PRN
Start: 2016-07-03 — End: 2016-07-03
  Administered 2016-07-03: 130 mg via INTRAVENOUS

## 2016-07-03 MED ORDER — FENTANYL CITRATE (PF) 250 MCG/5ML IJ SOLN
INTRAMUSCULAR | Status: AC
  Filled 2016-07-03: qty 5

## 2016-07-03 MED ORDER — FENTANYL CITRATE (PF) 100 MCG/2ML IJ SOLN: 25.0000 ug | INTRAMUSCULAR | Status: DC | PRN

## 2016-07-03 MED ORDER — PHENYLEPHRINE 40 MCG/ML (10ML) SYRINGE FOR IV PUSH (FOR BLOOD PRESSURE SUPPORT)
PREFILLED_SYRINGE | INTRAVENOUS | Status: AC
  Filled 2016-07-03: qty 10

## 2016-07-03 MED ORDER — CARBIDOPA-LEVODOPA 25-100 MG PO TABS
ORAL_TABLET | ORAL | Status: AC
  Filled 2016-07-03: qty 2

## 2016-07-03 MED ORDER — MEPERIDINE HCL 50 MG/ML IJ SOLN: 6.2500 mg | INTRAMUSCULAR | Status: DC | PRN

## 2016-07-03 MED ORDER — SODIUM CHLORIDE 0.9 % IR SOLN
Status: DC | PRN
  Administered 2016-07-03: 3000 mL via INTRAVESICAL

## 2016-07-03 SURGICAL SUPPLY — 11 items
BAG URO CATCHER STRL LF (MISCELLANEOUS) ×3 IMPLANT
CATH INTERMIT  6FR 70CM (CATHETERS) ×3 IMPLANT
CLOTH BEACON ORANGE TIMEOUT ST (SAFETY) ×3 IMPLANT
GLOVE BIOGEL M STRL SZ7.5 (GLOVE) ×3 IMPLANT
GOWN STRL REUS W/TWL LRG LVL3 (GOWN DISPOSABLE) ×6 IMPLANT
GUIDEWIRE STR DUAL SENSOR (WIRE) ×3 IMPLANT
MANIFOLD NEPTUNE II (INSTRUMENTS) ×3 IMPLANT
PACK CYSTO (CUSTOM PROCEDURE TRAY) ×3 IMPLANT
STENT URET 6FRX24 CONTOUR (STENTS) ×2 IMPLANT
TUBING CONNECTING 10 (TUBING) ×2 IMPLANT
TUBING CONNECTING 10' (TUBING) ×1

## 2016-07-03 NOTE — Op Note (Signed)
  Preoperative diagnosis:  1. Left ureteral stone 2. UTI/Bacteremia   Postoperative diagnosis:  1. Left ureteral stone 2. UTI/Bacteremia   Procedure:  1. Cystoscopy 2. Left ureteral stent placement (6 x 24 - no string)  3. Left retrograde pyelography with interpretation   Surgeon: Moody Bruins. M.D.  Resident: Dr. Lincoln Brigham  Anesthesia: General  Complications: None  Intraoperative findings: L RPG was performed with a 6 Fr ureteral catheter and omnipaque contrast.  This demonstrated a filling defect in the proximal ureter consistent with the patient's known stone but no other obvious abnormalities.  The renal collecting system was not completely filled out to avoid significant back pressure in her infected renal collecting system.  EBL: Minimal  Specimens: None  Indication: Carol Mckee is a 75 y.o. patient with a 1.2 cm left UPJ stone and UTI with bacteremia. After reviewing the management options for treatment, he elected to proceed with the above surgical procedure(s). We have discussed the potential benefits and risks of the procedure, side effects of the proposed treatment, the likelihood of the patient achieving the goals of the procedure, and any potential problems that might occur during the procedure or recuperation. Informed consent has been obtained.  Description of procedure:  The patient was taken to the operating room and general anesthesia was induced.  The patient was placed in the dorsal lithotomy position, prepped and draped in the usual sterile fashion, and preoperative antibiotics were administered. A preoperative time-out was performed.   Cystourethroscopy was performed.  The patient's urethra was examined and was normal. The bladder was then systematically examined in its entirety. There was no evidence for any bladder tumors, stones, or other mucosal pathology.    Attention then turned to the left ureteral orifice and a ureteral catheter was used to  intubate the ureteral orifice.  Omnipaque contrast was injected through the ureteral catheter and a retrograde pyelogram was performed with findings as dictated above.  A 0.38 sensor guidewire was then advanced up the left ureter into the renal pelvis under fluoroscopic guidance.  The wire was then backloaded through the cystoscope and a ureteral stent was advance over the wire using Seldinger technique.  The stent was positioned appropriately under fluoroscopic and cystoscopic guidance.  The wire was then removed with an adequate stent curl noted in the renal pelvis as well as in the bladder.  The bladder was then emptied and the procedure ended.  The patient appeared to tolerate the procedure well and without complications.  The patient was able to be awakened and transferred to the recovery unit in satisfactory condition.    Moody Bruins MD

## 2016-07-03 NOTE — Consult Note (Signed)
Urology Consult   Physician requesting consult: Dr. Peggye Pitt  Reason for consult: left UPJ stone with UTI  History of Present Illness: Carol Mckee is a 75 y.o. female with history of Parkinson's disease who presented on 06/30/16 a fever of 103 at home, chills, dysuria. In the ED she was found to have a urinalysis that was consistent with a UTI and a temp of 102.1. The patient had a WBC of 17K and creatinine of 1.1 from baseline of 0.7. Her WBC has downtrended to 9.3 with Cefepime-->Rocephin and creatinine has normalized with hydration. She had a CT scan yesterday which showed a 12 mm stone at the left UPJ with moderate left-sided hydronephrosis. Urine and blood cultures are both growing Klebsiella. Of note she did have a long hospitalization in June for Klebsiella UTI but had a renal ultrasound at that time that did not show any hydronephrosis. She had one prior kidney stone 40 year ago removed with a basket in addition to a "bladder tack" via vaginal approach  20 years ago. Currently afebrile and denies nausea, vomiting. Feels much better than on admission. Does endorse chronic back pain secondary to spinal stenosis so it is difficult to differentiate from any kidney stone pain. Has mild dysuria, no hematuria. Last ate at noon.   Past Medical History:  Diagnosis Date  . Abnormality of gait   . Edema   . Fatigue   . Other chronic pulmonary heart diseases   . Paralysis agitans (HCC)   . Parkinson disease (HCC)   . Parkinson's disease (HCC) 10/04/2013  . Spinal stenosis   . Stiffness of joint, not elsewhere classified,  shoulder region     Past Surgical History:  Procedure Laterality Date  . ABDOMINAL HYSTERECTOMY       Current Hospital Medications:  Home meds:    Medication List    ASK your doctor about these medications   baclofen 10 MG tablet Commonly known as:  LIORESAL Take 10 mg by mouth daily.   carbidopa-levodopa 25-100 MG tablet Commonly known as:  SINEMET  IR Take 1.5 tablets by mouth 6 (six) times daily.   CENTRUM SILVER PO Take 1 tablet by mouth daily.   cephALEXin 500 MG capsule Commonly known as:  KEFLEX Take 1 capsule (500 mg total) by mouth 3 (three) times daily.   diazepam 5 MG tablet Commonly known as:  VALIUM Take 0.5 tablets (2.5 mg total) by mouth daily.   DULoxetine 30 MG capsule Commonly known as:  CYMBALTA Take 30 mg by mouth daily.   ferrous sulfate 325 (65 FE) MG tablet Take 325 mg by mouth daily with breakfast.   furosemide 20 MG tablet Commonly known as:  LASIX Take 20 mg by mouth daily as needed.   HYDROcodone-acetaminophen 5-325 MG tablet Commonly known as:  NORCO/VICODIN Take 0.5-1 tablets by mouth 6 (six) times daily. TAKES ONE TABLET IN THE MORNING AND TAKES ONE-HALF TABLET 5 TIMES DAILY   KRILL OIL PO Take 1 capsule by mouth daily.   magnesium oxide 400 MG tablet Commonly known as:  MAG-OX Take 400 mg by mouth daily.   polyethylene glycol packet Commonly known as:  MIRALAX / GLYCOLAX Take 17 g by mouth daily.   Vitamin D3 5000 units Caps Take 1 capsule by mouth daily.   vitamin E 400 UNIT capsule Take 400 Units by mouth daily.   VOLTAREN 1 % Gel Generic drug:  diclofenac sodium Apply 1 application topically daily as needed (for pain).  Scheduled Meds: . antiseptic oral rinse  7 mL Mouth Rinse BID  . baclofen  10 mg Oral Daily  . carbidopa-levodopa  1.5 tablet Oral 6 X Daily  . cefTRIAXone (ROCEPHIN)  IV  1 g Intravenous Q24H  . diazepam  2.5 mg Oral Daily  . DULoxetine  30 mg Oral Daily  . enoxaparin (LOVENOX) injection  40 mg Subcutaneous Q24H  . ferrous sulfate  325 mg Oral Q breakfast  . HYDROcodone-acetaminophen  0.5 tablet Oral 5 times per day  . HYDROcodone-acetaminophen  1 tablet Oral Q0600  . magnesium oxide  400 mg Oral Daily  . nystatin   Topical BID  . polyethylene glycol  17 g Oral Daily   Continuous Infusions: . sodium chloride 75 mL/hr at 07/03/16 0932    PRN Meds:.acetaminophen **OR** acetaminophen, ondansetron **OR** ondansetron (ZOFRAN) IV, senna-docusate  Allergies:  Allergies  Allergen Reactions  . Azithromycin Diarrhea  . Lyrica [Pregabalin]     Drops blood pressure  . Tizanidine Other (See Comments)    hypotension  . Erythromycin Diarrhea    History reviewed. No pertinent family history.  Social History:  reports that she has never smoked. She has never used smokeless tobacco. She reports that she does not drink alcohol or use drugs.  ROS: A complete review of systems was performed.  All systems are negative except for pertinent findings as noted.  Physical Exam:  Vital signs in last 24 hours: Temp:  [98.6 F (37 C)-99.2 F (37.3 C)] 99.2 F (37.3 C) (07/29 1335) Pulse Rate:  [67-86] 71 (07/29 1335) Resp:  [16-20] 20 (07/29 1335) BP: (107-131)/(54-88) 131/54 (07/29 1335) SpO2:  [90 %-94 %] 90 % (07/29 1335) Constitutional:  Alert and oriented, No acute distress Cardiovascular: normal peripheral perfusion Respiratory: Normal respiratory effort  GI: Abdomen is soft, nontender, nondistended, no abdominal masses GU: No CVA tenderness  Laboratory Data:   Recent Labs  07/01/16 0441 07/03/16 0700  WBC 12.9* 9.3  HGB 7.7* 8.3*  HCT 24.1* 25.8*  PLT 350 354     Recent Labs  07/01/16 0441 07/03/16 0700  NA 139 135  K 4.1 3.7  CL 106 106  GLUCOSE 123* 122*  BUN 16 14  CALCIUM 8.0* 7.8*  CREATININE 0.90 0.69     Results for orders placed or performed during the hospital encounter of 06/30/16 (from the past 24 hour(s))  Basic metabolic panel     Status: Abnormal   Collection Time: 07/03/16  7:00 AM  Result Value Ref Range   Sodium 135 135 - 145 mmol/L   Potassium 3.7 3.5 - 5.1 mmol/L   Chloride 106 101 - 111 mmol/L   CO2 26 22 - 32 mmol/L   Glucose, Bld 122 (H) 65 - 99 mg/dL   BUN 14 6 - 20 mg/dL   Creatinine, Ser 4.09 0.44 - 1.00 mg/dL   Calcium 7.8 (L) 8.9 - 10.3 mg/dL   GFR calc non Af Amer  >60 >60 mL/min   GFR calc Af Amer >60 >60 mL/min   Anion gap 3 (L) 5 - 15  CBC     Status: Abnormal   Collection Time: 07/03/16  7:00 AM  Result Value Ref Range   WBC 9.3 4.0 - 10.5 K/uL   RBC 2.77 (L) 3.87 - 5.11 MIL/uL   Hemoglobin 8.3 (L) 12.0 - 15.0 g/dL   HCT 81.1 (L) 91.4 - 78.2 %   MCV 93.1 78.0 - 100.0 fL   MCH 30.0 26.0 - 34.0 pg  MCHC 32.2 30.0 - 36.0 g/dL   RDW 16.1 09.6 - 04.5 %   Platelets 354 150 - 400 K/uL   Recent Results (from the past 240 hour(s))  Blood Culture (routine x 2)     Status: Abnormal   Collection Time: 06/30/16  6:53 AM  Result Value Ref Range Status   Specimen Description BLOOD RIGHT FOREARM DRAWN BY RN  Final   Special Requests   Final    BOTTLES DRAWN AEROBIC AND ANAEROBIC AEB=7CC ANA=5CC   Culture  Setup Time   Final    GRAM NEGATIVE RODS Gram Stain Report Called to,Read Back By and Verified With: BUSICK,C ON 06/30/16 AT 2315 BY LOY,C PERFORMED AT [facility] CRITICAL RESULT CALLED TO, READ BACK BY AND VERIFIED WITH: CHRISTY BUSICK,RN @0530  07/01/16 MKELLY IN BOTH AEROBIC AND ANAEROBIC BOTTLES Performed at [facility]    Culture KLEBSIELLA PNEUMONIAE (A)  Final   Report Status 07/03/2016 FINAL  Final   Organism ID, Bacteria KLEBSIELLA PNEUMONIAE  Final      Susceptibility   Klebsiella pneumoniae - MIC*    AMPICILLIN >=32 RESISTANT Resistant     CEFAZOLIN <=4 SENSITIVE Sensitive     CEFEPIME <=1 SENSITIVE Sensitive     CEFTAZIDIME <=1 SENSITIVE Sensitive     CEFTRIAXONE <=1 SENSITIVE Sensitive     CIPROFLOXACIN <=0.25 SENSITIVE Sensitive     GENTAMICIN <=1 SENSITIVE Sensitive     IMIPENEM <=0.25 SENSITIVE Sensitive     TRIMETH/SULFA <=20 SENSITIVE Sensitive     AMPICILLIN/SULBACTAM >=32 RESISTANT Resistant     PIP/TAZO 32 INTERMEDIATE Intermediate     * KLEBSIELLA PNEUMONIAE  Blood Culture ID Panel (Reflexed)     Status: Abnormal   Collection Time: 06/30/16  6:53 AM  Result Value Ref Range Status   Enterococcus species NOT DETECTED  NOT DETECTED Final   Vancomycin resistance NOT DETECTED NOT DETECTED Final   Listeria monocytogenes NOT DETECTED NOT DETECTED Final   Staphylococcus species NOT DETECTED NOT DETECTED Final   Staphylococcus aureus NOT DETECTED NOT DETECTED Final   Methicillin resistance NOT DETECTED NOT DETECTED Final   Streptococcus species NOT DETECTED NOT DETECTED Final   Streptococcus agalactiae NOT DETECTED NOT DETECTED Final   Streptococcus pneumoniae NOT DETECTED NOT DETECTED Final   Streptococcus pyogenes NOT DETECTED NOT DETECTED Final   Acinetobacter baumannii NOT DETECTED NOT DETECTED Final   Enterobacteriaceae species DETECTED (A) NOT DETECTED Final    Comment: CRITICAL RESULT CALLED TO, READ BACK BY AND VERIFIED WITH: C BUSICK,RN @0529  07/01/16 MKELLY    Enterobacter cloacae complex NOT DETECTED NOT DETECTED Final   Escherichia coli NOT DETECTED NOT DETECTED Final   Klebsiella oxytoca NOT DETECTED NOT DETECTED Final   Klebsiella pneumoniae DETECTED (A) NOT DETECTED Final    Comment: CRITICAL RESULT CALLED TO, READ BACK BY AND VERIFIED WITH: C BUSICK,RN @0529  07/01/16 MKELLY    Proteus species NOT DETECTED NOT DETECTED Final   Serratia marcescens NOT DETECTED NOT DETECTED Final   Carbapenem resistance NOT DETECTED NOT DETECTED Final   Haemophilus influenzae NOT DETECTED NOT DETECTED Final   Neisseria meningitidis NOT DETECTED NOT DETECTED Final   Pseudomonas aeruginosa NOT DETECTED NOT DETECTED Final   Candida albicans NOT DETECTED NOT DETECTED Final   Candida glabrata NOT DETECTED NOT DETECTED Final   Candida krusei NOT DETECTED NOT DETECTED Final   Candida parapsilosis NOT DETECTED NOT DETECTED Final   Candida tropicalis NOT DETECTED NOT DETECTED Final    Comment: Performed at [facility]  Blood Culture (routine x 2)     Status: Abnormal   Collection Time: 06/30/16  7:15 AM  Result Value Ref Range Status   Specimen Description BLOOD RIGHT FOREARM DRAWN BY RN LR  Final    Special Requests BOTTLES DRAWN AEROBIC AND ANAEROBIC 10CC EACH  Final   Culture  Setup Time   Final    GRAM NEGATIVE RODS Gram Stain Report Called to,Read Back By and Verified With: BUSICK,C  ON 06/30/16 AT 2315 BY LOY,C PERFORMED AT [facility] IN BOTH AEROBIC AND ANAEROBIC BOTTLES    Culture (A)  Final    KLEBSIELLA PNEUMONIAE SUSCEPTIBILITIES PERFORMED ON PREVIOUS CULTURE WITHIN THE LAST 5 DAYS. Performed at [facility]    Report Status 07/03/2016 FINAL  Final  Urine culture     Status: Abnormal   Collection Time: 06/30/16  7:30 AM  Result Value Ref Range Status   Specimen Description URINE, CATHETERIZED  Final   Special Requests NONE  Final   Culture >=100,000 COLONIES/mL KLEBSIELLA PNEUMONIAE (A)  Final   Report Status 07/02/2016 FINAL  Final   Organism ID, Bacteria KLEBSIELLA PNEUMONIAE (A)  Final      Susceptibility   Klebsiella pneumoniae - MIC*    AMPICILLIN >=32 RESISTANT Resistant     CEFAZOLIN <=4 SENSITIVE Sensitive     CEFTRIAXONE <=1 SENSITIVE Sensitive     CIPROFLOXACIN <=0.25 SENSITIVE Sensitive     GENTAMICIN <=1 SENSITIVE Sensitive     IMIPENEM <=0.25 SENSITIVE Sensitive     NITROFURANTOIN 128 RESISTANT Resistant     TRIMETH/SULFA <=20 SENSITIVE Sensitive     AMPICILLIN/SULBACTAM >=32 RESISTANT Resistant     PIP/TAZO 64 INTERMEDIATE Intermediate     * >=100,000 COLONIES/mL KLEBSIELLA PNEUMONIAE    Renal Function:  Recent Labs  06/30/16 0653 07/01/16 0441 07/03/16 0700  CREATININE 1.12* 0.90 0.69   Estimated Creatinine Clearance: 67.2 mL/min (by C-G formula based on SCr of 0.8 mg/dL).  Radiologic Imaging: Ct Renal Stone Study  Result Date: 07/02/2016 CLINICAL DATA:  Bilateral low back pain.  UTI.  Fever.  Bacteremia. EXAM: CT ABDOMEN AND PELVIS WITHOUT CONTRAST TECHNIQUE: Multidetector CT imaging of the abdomen and pelvis was performed following the standard protocol without IV contrast. COMPARISON:  Renal ultrasound 05/31/2016. FINDINGS: Lower  chest: Small bilateral pleural effusions are present. There is associated atelectasis. The lung bases are otherwise clear. The heart size is normal. Calcifications are present at the aortic valve. Atherosclerotic calcifications are present in the aorta. Hepatobiliary: No focal hepatic lesions are present. The liver edge is smooth. The common bile duct and gallbladder are within normal limits. Pancreas: No focal inflammatory change, mass, or cyst is present. There is no significant ductal dilation. Atrophy is noted. Spleen: Within normal limits. Adrenals/Urinary Tract: The adrenal glands are normal bilaterally. The right kidney in ureter are within normal limits. Mild perinephric laboratory changes are present. 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 An obstructing 12 mm stone is present at the left UPJ. The more distal left ureter is within normal limits. The urinary bladder is unremarkable. Stomach/Bowel: The stomach and duodenum are within normal limits. The remainder the small bowel is normal. The appendix is visualized and within normal limits. The ascending colon is within normal limits as well. The transverse and descending colon are normal. The rectosigmoid colon is unremarkable. There is moderate stool at the rectum. No obstruction is evident. Vascular/Lymphatic: Atherosclerotic changes are no throughout the aorta and branch vessels. There is no aneurysm. No significant adenopathy is present. Reproductive:  Hysterectomy is noted. The ovaries are not discretely visualized and may be surgically absent as well. Other: A small paraumbilical hernia contains fat but no bowel. There is no significant free fluid. Musculoskeletal: Dextro convex scoliosis is present. Multilevel degenerative change in stenosis is present. No focal lytic or blastic lesions are present. Right lateral listhesis is noted L3-4 with rightward slip of over half width of the vertebral bodies. IMPRESSION: 1. Obstructing 12 mm stone at the left  UPJ with moderate left-sided hydronephrosis. 2. No other significant nephrolithiasis. 3. Marked inflammatory changes about the left greater than right kidney. 4. Small bilateral pleural effusions and associated atelectasis. 5. Atherosclerosis without aneurysm. 6. Scoliosis and marked spondylosis of the lumbar spine. 7. Small paraumbilical hernia contains fat but no bowel. Electronically Signed   By: Marin Roberts M.D.   On: 07/02/2016 19:55   I independently reviewed the above imaging studies.  Impression/Recommendation: 75 yo female with obstructing 1.2cm left UPJ stone and bacteremia secondary to recurrent UTI. Given her bacteremia and urinary tract obstruction she will need decompression of her left kidney. We discussed the options of left ureteral stent placement vs left nephrostomy tube placement. We recommended stent placement, which she does prefer. The risks and benefits of left ureteral stent placement were discussed with her and she agrees to proceed.  - Please keep patient NPO pending OR - Will plan for cystoscopy, left retrograde pyelogram, left ureteral stent placement this evening - Can transition to Ciprofloxacin as oral option given sensitivities. Would recommend 14 day course given bacteremia/pyelo   Carol Mckee 07/03/2016, 3:29 PM    I have seen and examined the patient and agree with the above assessment and plan.  Plan for cystoscopy and stent placement today.  Ok to transition to po Cipro.  Will need 2 weeks of total antibiotic therapy.

## 2016-07-03 NOTE — Progress Notes (Signed)
Taken to surgery, denies valuables in room, has just voided, has been npo since noon.

## 2016-07-03 NOTE — Anesthesia Procedure Notes (Signed)
Procedure Name: Intubation Date/Time: 07/03/2016 8:22 PM Performed by: Rubena Roseman, Nuala Alpha Pre-anesthesia Checklist: Patient identified, Emergency Drugs available, Suction available, Patient being monitored and Timeout performed Patient Re-evaluated:Patient Re-evaluated prior to inductionOxygen Delivery Method: Circle system utilized Preoxygenation: Pre-oxygenation with 100% oxygen Intubation Type: IV induction Ventilation: Mask ventilation without difficulty Laryngoscope Size: Mac and 4 Grade View: Grade II Tube type: Oral Tube size: 7.0 mm Number of attempts: 1 Airway Equipment and Method: Stylet Placement Confirmation: ETT inserted through vocal cords under direct vision,  positive ETCO2,  CO2 detector and breath sounds checked- equal and bilateral Secured at: 22 cm Tube secured with: Tape Dental Injury: Teeth and Oropharynx as per pre-operative assessment

## 2016-07-03 NOTE — Transfer of Care (Signed)
Immediate Anesthesia Transfer of Care Note  Patient: Carol Mckee  Procedure(s) Performed: Procedure(s): CYSTOSCOPY WITH STENT PLACEMENT (Left)  Patient Location: PACU  Anesthesia Type:General  Level of Consciousness:  sedated, patient cooperative and responds to stimulation  Airway & Oxygen Therapy:Patient Spontanous Breathing and Patient connected to face mask oxgen  Post-op Assessment:  Report given to PACU RN and Post -op Vital signs reviewed and stable  Post vital signs:  Reviewed and stable  Last Vitals:  Vitals:   07/03/16 1540 07/03/16 2043  BP: (!) 157/63 (P) 135/70  Pulse: 82   Resp:    Temp: 37.3 C     Complications: No apparent anesthesia complications

## 2016-07-03 NOTE — Care Management Important Message (Signed)
Important Message  Patient Details  Name: Carol Mckee MRN: [id number] Date of Birth: 05/19/1941   Medicare Important Message Given:  Yes    Fuller Plan, RN 07/03/2016, 12:08 PM

## 2016-07-03 NOTE — Anesthesia Preprocedure Evaluation (Addendum)
Anesthesia Evaluation  Patient identified by MRN, date of birth, ID band Patient awake    Reviewed: Allergy & Precautions, NPO status , Patient's Chart, lab work & pertinent test results  Airway Mallampati: III       Dental  (+) Teeth Intact, Dental Advisory Given   Pulmonary neg pulmonary ROS,    breath sounds clear to auscultation       Cardiovascular negative cardio ROS   Rhythm:Regular     Neuro/Psych Parkinson's  negative neurological ROS  negative psych ROS   GI/Hepatic negative GI ROS, Neg liver ROS,   Endo/Other  negative endocrine ROS  Renal/GU Stones  negative genitourinary   Musculoskeletal negative musculoskeletal ROS (+)   Abdominal   Peds negative pediatric ROS (+)  Hematology negative hematology ROS (+) anemia , 8/26   Anesthesia Other Findings   Reproductive/Obstetrics negative OB ROS                            Anesthesia Physical Anesthesia Plan  ASA: III and emergent  Anesthesia Plan: General   Post-op Pain Management:    Induction: Intravenous, Rapid sequence and Cricoid pressure planned  Airway Management Planned: Oral ETT  Additional Equipment:   Intra-op Plan:   Post-operative Plan: Extubation in OR  Informed Consent: I have reviewed the patients History and Physical, chart, labs and discussed the procedure including the risks, benefits and alternatives for the proposed anesthesia with the patient or authorized representative who has indicated his/her understanding and acceptance.     Plan Discussed with:   Anesthesia Plan Comments:         Anesthesia Quick Evaluation

## 2016-07-03 NOTE — Progress Notes (Signed)
RT placed patient on CPAP. Patient setting is auto 8-20 cmH2O. 2 liters oxygen bleed into tubing. Sterile water was added to water chamber for humidification. Patient is tolerating well. RT will continue to monitor.

## 2016-07-03 NOTE — Progress Notes (Signed)
Patient unable to wear CPAP at this time. RT placed patient back on 2 liter nasal canula. RT encouraged patients husband to have someone bring her home tubing and mask for her to be more comfortable. RT will continue to monitor.

## 2016-07-03 NOTE — Progress Notes (Signed)
PROGRESS NOTE    Carol Mckee  ZOX:[id number] DOB: 16-Jul-1941 DOA: 06/30/2016 PCP: Selinda Flavin, MD     Brief Narrative:  75 year old woman admitted on 7/26 from home with complaints of fever. Found to have a UTI. Blood and urine cultures have subsequently been positive for Klebsiella. CT scan shows obstructing 12 mm stone at the UPJ with hydronephrosis. Case discussed with Dr. Laverle Patter who requests transfer to [facility] for stent placement.   Assessment & Plan:   Principal Problem:   Fever Active Problems:   UTI (urinary tract infection)   Parkinson's disease (HCC)   Sepsis (HCC)   Acute renal failure (ARF) (HCC)   Leukocytosis   Klebsiella septicemia due to Klebsiella UTI -Based off of culture data will transition over to Rocephin. -As discussed with infectious diseases, Dr. Ilsa Iha, plan 7 days of antibiotics. Can transition over to oral Cipro or Levaquin. -CT scan with obstructing left UPJ stone with hydronephrosis. Case discussed with urology, Dr. Heloise Purpura who advises transfer to [facility] for stent placement this afternoon given lack of anesthesiology facilities at [facility] today.  Acute renal failure -Resolved with IV fluids.  Parkinson's disease -Continue home medications.   DVT prophylaxis: Lovenox Code Status: Full code Family Communication: Husband at bedside and daughter via phone updated on plan of care and all questions answered Disposition Plan: To be determined  Consultants:   None  Procedures:   None  Antimicrobials:   Cefepime    Subjective: No acute issues or complaints other than feeling cold. Objective: Vitals:   07/02/16 1354 07/02/16 2018 07/02/16 2138 07/03/16 0500  BP: (!) 105/52  116/88 (!) 107/57  Pulse: 83 86 79 67  Resp: Temp: 98.8 F (37.1 C)  98.7 F (37.1 C) 98.6 F (37 C)  TempSrc: Rectal  Rectal Rectal  SpO2:  93% 93% 94%  Weight:      Height:        Intake/Output Summary (Last 24 hours) at  07/03/16 1224 Last data filed at 07/02/16 1400  Gross per 24 hour  Intake              650 ml  Output                0 ml  Net              650 ml   Filed Weights   06/30/16 0637 06/30/16 0829  Weight: 95.3 kg (210 lb) 95.3 kg (210 lb)    Examination:  General exam: Alert, awake, oriented x 3 Respiratory system: Clear to auscultation. Respiratory effort normal. Cardiovascular system:RRR. No murmurs, rubs, gallops. Gastrointestinal system: Abdomen is nondistended, soft and nontender. No organomegaly or masses felt. Normal bowel sounds heard. Central nervous system: Alert, awake, nonfocal, nonintentional tremor of bilateral upper extremities. Extremities: No C/C/E, +pedal pulses Skin: No rashes, lesions or ulcers    Data Reviewed: I have personally reviewed following labs and imaging studies  CBC:  Recent Labs Lab 06/30/16 0653 07/01/16 0441 07/03/16 0700  WBC 16.8* 12.9* 9.3  NEUTROABS 14.0*  --   --   HGB 8.7* 7.7* 8.3*  HCT 27.1* 24.1* 25.8*  MCV 95.1 95.3 93.1  PLT 422* 350 354   Basic Metabolic Panel:  Recent Labs Lab 06/30/16 0653 07/01/16 0441 07/03/16 0700  NA 135 139 135  K 4.6 4.1 3.7  CL 99* 106 106  CO2 GLUCOSE 146* 123* 122*  BUN 16 16  14  CREATININE 1.12* 0.90 0.69  CALCIUM 8.3* 8.0* 7.8*   GFR: Estimated Creatinine Clearance: 67.2 mL/min (by C-G formula based on SCr of 0.8 mg/dL). Liver Function Tests:  Recent Labs Lab 06/30/16 0653  AST 18  ALT <5*  ALKPHOS 72  BILITOT 0.6  PROT 7.4  ALBUMIN 2.7*   No results for input(s): LIPASE, AMYLASE in the last 168 hours. No results for input(s): AMMONIA in the last 168 hours. Coagulation Profile: No results for input(s): INR, PROTIME in the last 168 hours. Cardiac Enzymes:  Recent Labs Lab 06/30/16 0653  TROPONINI <0.03   BNP (last 3 results) No results for input(s): PROBNP in the last 8760 hours. HbA1C: No results for input(s): HGBA1C in the last 72 hours. CBG: No  results for input(s): GLUCAP in the last 168 hours. Lipid Profile: No results for input(s): CHOL, HDL, LDLCALC, TRIG, CHOLHDL, LDLDIRECT in the last 72 hours. Thyroid Function Tests: No results for input(s): TSH, T4TOTAL, FREET4, T3FREE, THYROIDAB in the last 72 hours. Anemia Panel: No results for input(s): VITAMINB12, FOLATE, FERRITIN, TIBC, IRON, RETICCTPCT in the last 72 hours. Urine analysis:    Component Value Date/Time   COLORURINE YELLOW 06/30/2016 0733   APPEARANCEUR CLEAR 06/30/2016 0733   LABSPEC 1.010 06/30/2016 0733   PHURINE 7.0 06/30/2016 0733   GLUCOSEU NEGATIVE 06/30/2016 0733   HGBUR NEGATIVE 06/30/2016 0733   BILIRUBINUR NEGATIVE 06/30/2016 0733   KETONESUR NEGATIVE 06/30/2016 0733   PROTEINUR TRACE (A) 06/30/2016 0733   UROBILINOGEN 0.2 04/19/2012 1532   NITRITE POSITIVE (A) 06/30/2016 0733   LEUKOCYTESUR TRACE (A) 06/30/2016 0733   Sepsis Labs: @LABRCNTIP (procalcitonin:4,lacticidven:4)  ) Recent Results (from the past 240 hour(s))  Blood Culture (routine x 2)     Status: Abnormal   Collection Time: 06/30/16  6:53 AM  Result Value Ref Range Status   Specimen Description BLOOD RIGHT FOREARM DRAWN BY RN  Final   Special Requests   Final    BOTTLES DRAWN AEROBIC AND ANAEROBIC AEB=7CC ANA=5CC   Culture  Setup Time   Final    GRAM NEGATIVE RODS Gram Stain Report Called to,Read Back By and Verified With: BUSICK,C ON 06/30/16 AT 2315 BY LOY,C PERFORMED AT [facility] CRITICAL RESULT CALLED TO, READ BACK BY AND VERIFIED WITH: CHRISTY BUSICK,RN @0530  07/01/16 MKELLY IN BOTH AEROBIC AND ANAEROBIC BOTTLES Performed at [facility]    Culture KLEBSIELLA PNEUMONIAE (A)  Final   Report Status 07/03/2016 FINAL  Final   Organism ID, Bacteria KLEBSIELLA PNEUMONIAE  Final      Susceptibility   Klebsiella pneumoniae - MIC*    AMPICILLIN >=32 RESISTANT Resistant     CEFAZOLIN <=4 SENSITIVE Sensitive     CEFEPIME <=1 SENSITIVE Sensitive     CEFTAZIDIME <=1 SENSITIVE  Sensitive     CEFTRIAXONE <=1 SENSITIVE Sensitive     CIPROFLOXACIN <=0.25 SENSITIVE Sensitive     GENTAMICIN <=1 SENSITIVE Sensitive     IMIPENEM <=0.25 SENSITIVE Sensitive     TRIMETH/SULFA <=20 SENSITIVE Sensitive     AMPICILLIN/SULBACTAM >=32 RESISTANT Resistant     PIP/TAZO 32 INTERMEDIATE Intermediate     * KLEBSIELLA PNEUMONIAE  Blood Culture ID Panel (Reflexed)     Status: Abnormal   Collection Time: 06/30/16  6:53 AM  Result Value Ref Range Status   Enterococcus species NOT DETECTED NOT DETECTED Final   Vancomycin resistance NOT DETECTED NOT DETECTED Final   Listeria monocytogenes NOT DETECTED NOT DETECTED Final   Staphylococcus species NOT DETECTED NOT DETECTED Final  Staphylococcus aureus NOT DETECTED NOT DETECTED Final   Methicillin resistance NOT DETECTED NOT DETECTED Final   Streptococcus species NOT DETECTED NOT DETECTED Final   Streptococcus agalactiae NOT DETECTED NOT DETECTED Final   Streptococcus pneumoniae NOT DETECTED NOT DETECTED Final   Streptococcus pyogenes NOT DETECTED NOT DETECTED Final   Acinetobacter baumannii NOT DETECTED NOT DETECTED Final   Enterobacteriaceae species DETECTED (A) NOT DETECTED Final    Comment: CRITICAL RESULT CALLED TO, READ BACK BY AND VERIFIED WITH: C BUSICK,RN @0529  07/01/16 MKELLY    Enterobacter cloacae complex NOT DETECTED NOT DETECTED Final   Escherichia coli NOT DETECTED NOT DETECTED Final   Klebsiella oxytoca NOT DETECTED NOT DETECTED Final   Klebsiella pneumoniae DETECTED (A) NOT DETECTED Final    Comment: CRITICAL RESULT CALLED TO, READ BACK BY AND VERIFIED WITH: C BUSICK,RN @0529  07/01/16 MKELLY    Proteus species NOT DETECTED NOT DETECTED Final   Serratia marcescens NOT DETECTED NOT DETECTED Final   Carbapenem resistance NOT DETECTED NOT DETECTED Final   Haemophilus influenzae NOT DETECTED NOT DETECTED Final   Neisseria meningitidis NOT DETECTED NOT DETECTED Final   Pseudomonas aeruginosa NOT DETECTED NOT DETECTED  Final   Candida albicans NOT DETECTED NOT DETECTED Final   Candida glabrata NOT DETECTED NOT DETECTED Final   Candida krusei NOT DETECTED NOT DETECTED Final   Candida parapsilosis NOT DETECTED NOT DETECTED Final   Candida tropicalis NOT DETECTED NOT DETECTED Final    Comment: Performed at [facility]  Blood Culture (routine x 2)     Status: Abnormal   Collection Time: 06/30/16  7:15 AM  Result Value Ref Range Status   Specimen Description BLOOD RIGHT FOREARM DRAWN BY RN LR  Final   Special Requests BOTTLES DRAWN AEROBIC AND ANAEROBIC 10CC EACH  Final   Culture  Setup Time   Final    GRAM NEGATIVE RODS Gram Stain Report Called to,Read Back By and Verified With: BUSICK,C  ON 06/30/16 AT 2315 BY LOY,C PERFORMED AT [facility] IN BOTH AEROBIC AND ANAEROBIC BOTTLES    Culture (A)  Final    KLEBSIELLA PNEUMONIAE SUSCEPTIBILITIES PERFORMED ON PREVIOUS CULTURE WITHIN THE LAST 5 DAYS. Performed at [facility]    Report Status 07/03/2016 FINAL  Final  Urine culture     Status: Abnormal   Collection Time: 06/30/16  7:30 AM  Result Value Ref Range Status   Specimen Description URINE, CATHETERIZED  Final   Special Requests NONE  Final   Culture >=100,000 COLONIES/mL KLEBSIELLA PNEUMONIAE (A)  Final   Report Status 07/02/2016 FINAL  Final   Organism ID, Bacteria KLEBSIELLA PNEUMONIAE (A)  Final      Susceptibility   Klebsiella pneumoniae - MIC*    AMPICILLIN >=32 RESISTANT Resistant     CEFAZOLIN <=4 SENSITIVE Sensitive     CEFTRIAXONE <=1 SENSITIVE Sensitive     CIPROFLOXACIN <=0.25 SENSITIVE Sensitive     GENTAMICIN <=1 SENSITIVE Sensitive     IMIPENEM <=0.25 SENSITIVE Sensitive     NITROFURANTOIN 128 RESISTANT Resistant     TRIMETH/SULFA <=20 SENSITIVE Sensitive     AMPICILLIN/SULBACTAM >=32 RESISTANT Resistant     PIP/TAZO 64 INTERMEDIATE Intermediate     * >=100,000 COLONIES/mL KLEBSIELLA PNEUMONIAE         Radiology Studies: Ct Renal Stone Study  Result Date:  07/02/2016 CLINICAL DATA:  Bilateral low back pain.  UTI.  Fever.  Bacteremia. EXAM: CT ABDOMEN AND PELVIS WITHOUT CONTRAST TECHNIQUE: Multidetector CT imaging of the abdomen and pelvis  was performed following the standard protocol without IV contrast. COMPARISON:  Renal ultrasound 05/31/2016. FINDINGS: Lower chest: Small bilateral pleural effusions are present. There is associated atelectasis. The lung bases are otherwise clear. The heart size is normal. Calcifications are present at the aortic valve. Atherosclerotic calcifications are present in the aorta. Hepatobiliary: No focal hepatic lesions are present. The liver edge is smooth. The common bile duct and gallbladder are within normal limits. Pancreas: No focal inflammatory change, mass, or cyst is present. There is no significant ductal dilation. Atrophy is noted. Spleen: Within normal limits. Adrenals/Urinary Tract: The adrenal glands are normal bilaterally. The right kidney in ureter are within normal limits. Mild perinephric laboratory changes are present. 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 An obstructing 12 mm stone is present at the left UPJ. The more distal left ureter is within normal limits. The urinary bladder is unremarkable. Stomach/Bowel: The stomach and duodenum are within normal limits. The remainder the small bowel is normal. The appendix is visualized and within normal limits. The ascending colon is within normal limits as well. The transverse and descending colon are normal. The rectosigmoid colon is unremarkable. There is moderate stool at the rectum. No obstruction is evident. Vascular/Lymphatic: Atherosclerotic changes are no throughout the aorta and branch vessels. There is no aneurysm. No significant adenopathy is present. Reproductive: Hysterectomy is noted. The ovaries are not discretely visualized and may be surgically absent as well. Other: A small paraumbilical hernia contains fat but no bowel. There is no significant free  fluid. Musculoskeletal: Dextro convex scoliosis is present. Multilevel degenerative change in stenosis is present. No focal lytic or blastic lesions are present. Right lateral listhesis is noted L3-4 with rightward slip of over half width of the vertebral bodies. IMPRESSION: 1. Obstructing 12 mm stone at the left UPJ with moderate left-sided hydronephrosis. 2. No other significant nephrolithiasis. 3. Marked inflammatory changes about the left greater than right kidney. 4. Small bilateral pleural effusions and associated atelectasis. 5. Atherosclerosis without aneurysm. 6. Scoliosis and marked spondylosis of the lumbar spine. 7. Small paraumbilical hernia contains fat but no bowel. Electronically Signed   By: Marin Roberts M.D.   On: 07/02/2016 19:55       Scheduled Meds: . antiseptic oral rinse  7 mL Mouth Rinse BID  . baclofen  10 mg Oral Daily  . carbidopa-levodopa  1.5 tablet Oral 6 X Daily  . cefTRIAXone (ROCEPHIN)  IV  1 g Intravenous Q24H  . diazepam  2.5 mg Oral Daily  . DULoxetine  30 mg Oral Daily  . enoxaparin (LOVENOX) injection  40 mg Subcutaneous Q24H  . ferrous sulfate  325 mg Oral Q breakfast  . HYDROcodone-acetaminophen  0.5 tablet Oral 5 times per day  . HYDROcodone-acetaminophen  1 tablet Oral Q0600  . magnesium oxide  400 mg Oral Daily  . nystatin   Topical BID  . polyethylene glycol  17 g Oral Daily   Continuous Infusions: . sodium chloride 75 mL/hr at 07/03/16 0932     LOS: 1 day    Time spent: 25 minutes. Greater than 50% of this time was spent in direct contact with the patient coordinating care.     Chaya Jan, MD [facility] Pager [phone]  If 7PM-7AM, please contact night-coverage [email or web address] Password [facility] 07/03/2016, 12:24 PM

## 2016-07-03 NOTE — Progress Notes (Signed)
1258 Called Carol Mckee [address] [place] to give report to nurse receiving patient, nurse Dois Davenport) not available for report at this time, this writer's phone number left with Lurena Joiner for nurse Dois Davenport) to return call to receive report on patient.

## 2016-07-03 NOTE — Anesthesia Postprocedure Evaluation (Signed)
Anesthesia Post Note  Patient: Buford Dresser  Procedure(s) Performed: Procedure(s) (LRB): CYSTOSCOPY WITH STENT PLACEMENT (Left)  Patient location during evaluation: PACU Anesthesia Type: General Level of consciousness: awake and alert Pain management: pain level controlled Vital Signs Assessment: post-procedure vital signs reviewed and stable Respiratory status: spontaneous breathing, nonlabored ventilation, respiratory function stable and patient connected to nasal cannula oxygen Cardiovascular status: blood pressure returned to baseline and stable Postop Assessment: no signs of nausea or vomiting Anesthetic complications: no    Last Vitals:  Vitals:   07/03/16 2043 07/03/16 2100  BP: 135/70 (!) 154/53  Pulse: 78   Resp: 14   Temp: 37.1 C     Last Pain:  Vitals:   07/03/16 1720  TempSrc:   PainSc: Asleep                 Sebastian Ache

## 2016-07-04 DIAGNOSIS — N201 Calculus of ureter: Secondary | ICD-10-CM

## 2016-07-04 MED ORDER — DEXTROSE 5 % IV SOLN
1.0000 g | INTRAVENOUS | Status: AC
  Administered 2016-07-04: 1 g via INTRAVENOUS
  Filled 2016-07-04: qty 10

## 2016-07-04 MED ORDER — DEXTROSE 5 % IV SOLN
2.0000 g | INTRAVENOUS | Status: DC
  Filled 2016-07-04: qty 2

## 2016-07-04 NOTE — Evaluation (Signed)
Physical Therapy Evaluation Patient Details Name: Carol Mckee MRN: [id number] DOB: [birth year] Today's Date: 07/04/2016   History of Present Illness  HPI: Carol Mckee is a 75 y.o. female with history of Parkinson's disease with a recent prolonged hospitalization due to Klebsiella UTI and sepsis. She presents to the hospital 06/30/16  history of fever up to 103 at home, chills, dysuria. In the ED she was found to have a urinalysis that was consistent with a UTI, a temp of 102.1  Left ureteral stone, UTI/Bacteremia, Cystoscopy S/P Left ureteral stent placement    on 7/29  Clinical Impression  The patient and the spouse very motivated for the patient to be OOB. The spouse demonstrated how he assists the patient with mobility and transfers. The patient plans to return home. She has all DME. Pt admitted with above diagnosis. Pt currently with functional limitations due to the deficits listed below (see PT Problem List).  Pt will benefit from skilled PT to increase their independence and safety with mobility to allow discharge to the venue listed below.       Follow Up Recommendations Home health PT;Supervision/Assistance - 24 hour    Equipment Recommendations  None recommended by PT    Recommendations for Other Services       Precautions / Restrictions Precautions Precautions: Fall Precaution Comments: Sinemet dependent- every 4 hours      Mobility  Bed Mobility Overal bed mobility: Needs Assistance Bed Mobility: Supine to Sit     Supine to sit: Max assist     General bed mobility comments:  by scooping up the legs and trunk together to sit up.   Transfers Overall transfer level: Needs assistance Equipment used: 1 person hand held assist Transfers: Sit to/from [employer] Sit to Stand: Max assist Stand pivot transfers: Max assist       General transfer comment: the patient's spouse crossed his arms and held the patient's hands to stand up. Slow  and small  shuffle steps to turn to the recliner. the spouse assisted the patient  to control descent to sit down.  Ambulation/Gait                Stairs            Wheelchair Mobility    Modified Rankin (Stroke Patients Only)       Balance Overall balance assessment: Needs assistance Sitting-balance support: Feet supported;Bilateral upper extremity supported Sitting balance-Leahy Scale: Poor     Standing balance support: During functional activity;Bilateral upper extremity supported Standing balance-Leahy Scale: Poor                               Pertinent Vitals/Pain Pain Assessment: No/denies pain    Home Living Family/patient expects to be discharged to:: Private residence Living Arrangements: Spouse/significant other Available Help at Discharge: Friend(s);Family Type of Home: House Home Access:  Zenaida Niece lift  level with porch, uses Garment/textile technologist)     Home Layout: One level Home Equipment: Bedside commode;Walker - 2 wheels;Walker - 4 wheels;Cane - single point;Wheelchair - manual;Shower seat;Hospital bed;Other (comment) Additional Comments: lift chair-sleeps in it    Prior Function Level of Independence: Needs assistance   Gait / Transfers Assistance Needed: Pt states that she uses a Rollator at home, and uses the w/c in the community  ADL's / Homemaking Assistance Needed: Husband assists with dressing, and bathing -.  Pt states she uses a BSC at night. Husband assists her  OOB.        Hand Dominance        Extremity/Trunk Assessment   Upper Extremity Assessment: Defer to OT evaluation           Lower Extremity Assessment: RLE deficits/detail;LLE deficits/detail RLE Deficits / Details: tendency for foot to drop, weight on forefoot. LLE Deficits / Details: bears more weight on the  foot during transfer     Communication   Communication: Expressive difficulties (speach is unvclear at times, voice is quiet)  Cognition Arousal/Alertness:  Awake/alert Behavior During Therapy: WFL for tasks assessed/performed Overall Cognitive Status: Impaired/Different from baseline Area of Impairment: Orientation Orientation Level: Place;Time   Memory: Decreased short-term memory              General Comments      Exercises        Assessment/Plan    PT Assessment Patient needs continued PT services  PT Diagnosis Difficulty walking;Generalized weakness;Altered mental status   PT Problem List Decreased strength;Decreased range of motion;Decreased activity tolerance;Decreased balance;Decreased mobility;Decreased safety awareness;Decreased knowledge of use of DME;Decreased cognition  PT Treatment Interventions DME instruction;Gait training;Functional mobility training;Therapeutic activities;Therapeutic exercise;Balance training;Neuromuscular re-education;Patient/family education   PT Goals (Current goals can be found in the Care Plan section) Acute Rehab PT Goals Patient Stated Goal: to go home PT Goal Formulation: With patient/family Time For Goal Achievement: 07/18/16 Potential to Achieve Goals: Good    Frequency Min 3X/week   Barriers to discharge        Co-evaluation               End of Session Equipment Utilized During Treatment: Gait belt Activity Tolerance: Patient tolerated treatment well Patient left: in chair;with call bell/phone within reach;with chair alarm set;with family/visitor present Nurse Communication: Mobility status         Time: 5916-3846 PT Time Calculation (min) (ACUTE ONLY): 26 min   Charges:   PT Evaluation $PT Eval Low Complexity: 1 Procedure PT Treatments $Therapeutic Activity: 8-22 mins   PT G CodesRada Hay 07/04/2016, 4:42 PM Blanchard Kelch PT [phone]

## 2016-07-04 NOTE — Progress Notes (Signed)
Patient ID: Carol Mckee, female   DOB: 10/01/1941, 75 y.o.   MRN: [id number]  1 Day Post-Op Subjective: Pt s/p left ureteral stent yesterday for obstructing stone and Klebsiella bacteremia.  Pt denies pain.  HD stable overnight. Subjectively voiding well.  Objective: Vital signs in last 24 hours: Temp:  [98 F (36.7 C)-99.7 F (37.6 C)] 98.8 F (37.1 C) (07/30 0527) Pulse Rate:  [71-82] 72 (07/30 0527) Resp:  [14-20] 20 (07/30 0527) BP: (127-157)/(49-70) 127/55 (07/30 0527) SpO2:  [90 %-100 %] 99 % (07/30 0527) Weight:  [85.8 kg (189 lb 2.5 oz)] 85.8 kg (189 lb 2.5 oz) (07/29 1540)  Intake/Output from previous day: 07/29 0701 - 07/30 0700 In: 1555 [P.O.:180; I.V.:1375] Out: 600 [Urine:600] Intake/Output this shift: No intake/output data recorded.  Physical Exam:  General: Alert and oriented Abdomen: Soft, ND, NT   Lab Results:  Recent Labs  07/03/16 0700  HGB 8.3*  HCT 25.8*   BMET  Recent Labs  07/03/16 0700  NA 135  K 3.7  CL 106  CO2 26  GLUCOSE 122*  BUN 14  CREATININE 0.69  CALCIUM 7.8*     Studies/Results:  Assessment/Plan: S/P left ureteral stent for 12 mm left UPJ stone and bacteremia - Can change antibiotic to po ciprofloxacin.  Will need total of 2 weeks of treatment. - Ok for discharge from urologic standpoint at any time - Will arrange outpatient follow up in about 2-3 weeks to assess for resolution of UTI and to discuss definitive treatment options for stone - Please call if further questions   LOS: 2 days   Milburn Freeney,LES 07/04/2016, 7:53 AM

## 2016-07-04 NOTE — Care Management Note (Signed)
Case Management Note  Patient Details  Name: Carol Mckee MRN: [id number] Date of Birth: 1941/07/21  Subjective/Objective:    Left ureteral stone, UTI/Bacteremia, Cystoscopy             Action/Plan: Discharge Planning:  NCM spoke to pt's husband, pt active with [facility] for [facility] RN, PT, aide and SW. Contacted [facility] to make aware orders in [facility] for resumption of care. Pt has RW and bedside commode at home.   PCP- Selinda Flavin  MD  Expected Discharge Date:  07/05/2016               Expected Discharge Plan:  Home w Home Health Services  In-House Referral:     Discharge planning Services  CM Consult  Post Acute Care Choice:  Home Health Choice offered to:  Patient, Spouse  DME Arranged:  N/A DME Agency:  NA  HH Arranged:  RN, PT, Nurse's Aide, Social Work [employer] Agency:  [facility] [employer]  Status of Service:  Completed, signed off  If discussed at [employer], dates discussed:    Additional Comments:  Elliot Cousin, RN 07/04/2016, 3:32 PM

## 2016-07-04 NOTE — Progress Notes (Signed)
PROGRESS NOTE    Carol Mckee  YTK:[id number] DOB: 1941-06-02 DOA: 06/30/2016 PCP: Selinda Flavin, MD     Brief Narrative:  75 year old woman admitted on 7/26 from home with complaints of fever. Found to have a UTI. Blood and urine cultures have subsequently been positive for Klebsiella. CT scan shows obstructing 12 mm stone at the UPJ with hydronephrosis. Case discussed with Dr. Laverle Patter who requests transfer to [facility] for stent placement.   Assessment & Plan:   Principal Problem:   Fever Active Problems:   Parkinson's disease (HCC)   Sepsis (HCC)   UTI (urinary tract infection)   Acute renal failure (ARF) (HCC)   Leukocytosis   Klebsiella septicemia due to Klebsiella UTI - On IV Rocephin.  - Transition to cipro on discharge. Complete at least 7 days of antibiotics.  - CT scan with obstructing left UPJ stone with hydronephrosis. Urology input is appreciated. (Transferred to [facility] for stent placement from [facility] due to lack of anesthesiology facilities at [facility] on the day of transfer).  Acute renal failure -Resolved with IV fluids.  Parkinson's disease -Continue home medications. - PT/OT input and Case management consult to assist with discharge planning and needs. Will need to follow up with Urology on discharge.  DVT prophylaxis: Lovenox Code Status: Full code Family Communication: Husband. Disposition Plan: To be determined. Await PT/OT input.  Consultants:   Urology  Procedures:   Cystoscopy with left ureter stent placement for left ureteral stone.  Antimicrobials:   Rocephin   Subjective: Seen alongside patient's husband. Parkinson's affects patient's speech. Not in acute distress.     Objective: Vitals:   07/04/16 0104 07/04/16 0330 07/04/16 0527 07/04/16 1444  BP: (!) 135/53 (!) 149/63 (!) 127/55 (!) 140/53  Pulse: 77 74 72 80  Resp: 20 18 20 18   Temp: 99.7 F (37.6 C) 98.9 F (37.2 C) 98.8 F (37.1 C) 98.6 F (37 C)    TempSrc: Axillary Oral Oral Oral  SpO2: 98% 99% 99% 95%  Weight:      Height:        Intake/Output Summary (Last 24 hours) at 07/04/16 1628 Last data filed at 07/04/16 0900  Gross per 24 hour  Intake             1795 ml  Output              400 ml  Net             1395 ml   Filed Weights   06/30/16 0637 06/30/16 0829 07/03/16 1540  Weight: 95.3 kg (210 lb) 95.3 kg (210 lb) 85.8 kg (189 lb 2.5 oz)    Examination:  General exam: Awake and alert.  Respiratory system: Clear to auscultation. Respiratory effort normal. Cardiovascular system:RRR. No murmurs. Gastrointestinal system: Abdomen is nondistended, soft and nontender. Central nervous system: Tremors, slurred speech (from Parkinson's). Moves all limbs.  Extremities: No edema.    Data Reviewed: I have personally reviewed following labs and imaging studies  CBC:  Recent Labs Lab 06/30/16 0653 07/01/16 0441 07/03/16 0700  WBC 16.8* 12.9* 9.3  NEUTROABS 14.0*  --   --   HGB 8.7* 7.7* 8.3*  HCT 27.1* 24.1* 25.8*  MCV 95.1 95.3 93.1  PLT 422* 350 354   Basic Metabolic Panel:  Recent Labs Lab 06/30/16 0653 07/01/16 0441 07/03/16 0700  NA 135 139 135  K 4.6 4.1 3.7  CL 99* 106 106  CO2 27 25 26   GLUCOSE 146*  123* 122*  BUN CREATININE 1.12* 0.90 0.69  CALCIUM 8.3* 8.0* 7.8*   GFR: Estimated Creatinine Clearance: 65 mL/min (by C-G formula based on SCr of 0.8 mg/dL). Liver Function Tests:  Recent Labs Lab 06/30/16 0653  AST 18  ALT <5*  ALKPHOS 72  BILITOT 0.6  PROT 7.4  ALBUMIN 2.7*   No results for input(s): LIPASE, AMYLASE in the last 168 hours. No results for input(s): AMMONIA in the last 168 hours. Coagulation Profile: No results for input(s): INR, PROTIME in the last 168 hours. Cardiac Enzymes:  Recent Labs Lab 06/30/16 0653  TROPONINI <0.03   BNP (last 3 results) No results for input(s): PROBNP in the last 8760 hours. HbA1C: No results for input(s): HGBA1C in the last 72  hours. CBG: No results for input(s): GLUCAP in the last 168 hours. Lipid Profile: No results for input(s): CHOL, HDL, LDLCALC, TRIG, CHOLHDL, LDLDIRECT in the last 72 hours. Thyroid Function Tests: No results for input(s): TSH, T4TOTAL, FREET4, T3FREE, THYROIDAB in the last 72 hours. Anemia Panel: No results for input(s): VITAMINB12, FOLATE, FERRITIN, TIBC, IRON, RETICCTPCT in the last 72 hours. Urine analysis:    Component Value Date/Time   COLORURINE YELLOW 06/30/2016 0733   APPEARANCEUR CLEAR 06/30/2016 0733   LABSPEC 1.010 06/30/2016 0733   PHURINE 7.0 06/30/2016 0733   GLUCOSEU NEGATIVE 06/30/2016 0733   HGBUR NEGATIVE 06/30/2016 0733   BILIRUBINUR NEGATIVE 06/30/2016 0733   KETONESUR NEGATIVE 06/30/2016 0733   PROTEINUR TRACE (A) 06/30/2016 0733   UROBILINOGEN 0.2 04/19/2012 1532   NITRITE POSITIVE (A) 06/30/2016 0733   LEUKOCYTESUR TRACE (A) 06/30/2016 0733   Sepsis Labs: (procalcitonin:4,lacticidven:4)  ) Recent Results (from the past 240 hour(s))  Blood Culture (routine x 2)     Status: Abnormal   Collection Time: 06/30/16  6:53 AM  Result Value Ref Range Status   Specimen Description BLOOD RIGHT FOREARM DRAWN BY RN  Final   Special Requests   Final    BOTTLES DRAWN AEROBIC AND ANAEROBIC AEB=7CC ANA=5CC   Culture  Setup Time   Final    GRAM NEGATIVE RODS Gram Stain Report Called to,Read Back By and Verified With: BUSICK,C ON 06/30/16 AT 2315 BY LOY,C PERFORMED AT [facility] CRITICAL RESULT CALLED TO, READ BACK BY AND VERIFIED WITH: CHRISTY BUSICK,RN  07/01/16 MKELLY IN BOTH AEROBIC AND ANAEROBIC BOTTLES Performed at [facility]    Culture KLEBSIELLA PNEUMONIAE (A)  Final   Report Status 07/03/2016 FINAL  Final   Organism ID, Bacteria KLEBSIELLA PNEUMONIAE  Final      Susceptibility   Klebsiella pneumoniae - MIC*    AMPICILLIN >=32 RESISTANT Resistant     CEFAZOLIN <=4 SENSITIVE Sensitive     CEFEPIME <=1 SENSITIVE Sensitive     CEFTAZIDIME  <=1 SENSITIVE Sensitive     CEFTRIAXONE <=1 SENSITIVE Sensitive     CIPROFLOXACIN <=0.25 SENSITIVE Sensitive     GENTAMICIN <=1 SENSITIVE Sensitive     IMIPENEM <=0.25 SENSITIVE Sensitive     TRIMETH/SULFA <=20 SENSITIVE Sensitive     AMPICILLIN/SULBACTAM >=32 RESISTANT Resistant     PIP/TAZO 32 INTERMEDIATE Intermediate     * KLEBSIELLA PNEUMONIAE  Blood Culture ID Panel (Reflexed)     Status: Abnormal   Collection Time: 06/30/16  6:53 AM  Result Value Ref Range Status   Enterococcus species NOT DETECTED NOT DETECTED Final   Vancomycin resistance NOT DETECTED NOT DETECTED Final   Listeria monocytogenes NOT DETECTED NOT DETECTED Final   Staphylococcus  species NOT DETECTED NOT DETECTED Final   Staphylococcus aureus NOT DETECTED NOT DETECTED Final   Methicillin resistance NOT DETECTED NOT DETECTED Final   Streptococcus species NOT DETECTED NOT DETECTED Final   Streptococcus agalactiae NOT DETECTED NOT DETECTED Final   Streptococcus pneumoniae NOT DETECTED NOT DETECTED Final   Streptococcus pyogenes NOT DETECTED NOT DETECTED Final   Acinetobacter baumannii NOT DETECTED NOT DETECTED Final   Enterobacteriaceae species DETECTED (A) NOT DETECTED Final    Comment: CRITICAL RESULT CALLED TO, READ BACK BY AND VERIFIED WITH: C BUSICK,RN @0529  07/01/16 MKELLY    Enterobacter cloacae complex NOT DETECTED NOT DETECTED Final   Escherichia coli NOT DETECTED NOT DETECTED Final   Klebsiella oxytoca NOT DETECTED NOT DETECTED Final   Klebsiella pneumoniae DETECTED (A) NOT DETECTED Final    Comment: CRITICAL RESULT CALLED TO, READ BACK BY AND VERIFIED WITH: C BUSICK,RN @0529  07/01/16 MKELLY    Proteus species NOT DETECTED NOT DETECTED Final   Serratia marcescens NOT DETECTED NOT DETECTED Final   Carbapenem resistance NOT DETECTED NOT DETECTED Final   Haemophilus influenzae NOT DETECTED NOT DETECTED Final   Neisseria meningitidis NOT DETECTED NOT DETECTED Final   Pseudomonas aeruginosa NOT DETECTED  NOT DETECTED Final   Candida albicans NOT DETECTED NOT DETECTED Final   Candida glabrata NOT DETECTED NOT DETECTED Final   Candida krusei NOT DETECTED NOT DETECTED Final   Candida parapsilosis NOT DETECTED NOT DETECTED Final   Candida tropicalis NOT DETECTED NOT DETECTED Final    Comment: Performed at [facility]  Blood Culture (routine x 2)     Status: Abnormal   Collection Time: 06/30/16  7:15 AM  Result Value Ref Range Status   Specimen Description BLOOD RIGHT FOREARM DRAWN BY RN LR  Final   Special Requests BOTTLES DRAWN AEROBIC AND ANAEROBIC 10CC EACH  Final   Culture  Setup Time   Final    GRAM NEGATIVE RODS Gram Stain Report Called to,Read Back By and Verified With: BUSICK,C  ON 06/30/16 AT 2315 BY LOY,C PERFORMED AT [facility] IN BOTH AEROBIC AND ANAEROBIC BOTTLES    Culture (A)  Final    KLEBSIELLA PNEUMONIAE SUSCEPTIBILITIES PERFORMED ON PREVIOUS CULTURE WITHIN THE LAST 5 DAYS. Performed at [facility]    Report Status 07/03/2016 FINAL  Final  Urine culture     Status: Abnormal   Collection Time: 06/30/16  7:30 AM  Result Value Ref Range Status   Specimen Description URINE, CATHETERIZED  Final   Special Requests NONE  Final   Culture >=100,000 COLONIES/mL KLEBSIELLA PNEUMONIAE (A)  Final   Report Status 07/02/2016 FINAL  Final   Organism ID, Bacteria KLEBSIELLA PNEUMONIAE (A)  Final      Susceptibility   Klebsiella pneumoniae - MIC*    AMPICILLIN >=32 RESISTANT Resistant     CEFAZOLIN <=4 SENSITIVE Sensitive     CEFTRIAXONE <=1 SENSITIVE Sensitive     CIPROFLOXACIN <=0.25 SENSITIVE Sensitive     GENTAMICIN <=1 SENSITIVE Sensitive     IMIPENEM <=0.25 SENSITIVE Sensitive     NITROFURANTOIN 128 RESISTANT Resistant     TRIMETH/SULFA <=20 SENSITIVE Sensitive     AMPICILLIN/SULBACTAM >=32 RESISTANT Resistant     PIP/TAZO 64 INTERMEDIATE Intermediate     * >=100,000 COLONIES/mL KLEBSIELLA PNEUMONIAE         Radiology Studies: Ct Renal Stone  Study  Result Date: 07/02/2016 CLINICAL DATA:  Bilateral low back pain.  UTI.  Fever.  Bacteremia. EXAM: CT ABDOMEN AND PELVIS WITHOUT CONTRAST TECHNIQUE:  Multidetector CT imaging of the abdomen and pelvis was performed following the standard protocol without IV contrast. COMPARISON:  Renal ultrasound 05/31/2016. FINDINGS: Lower chest: Small bilateral pleural effusions are present. There is associated atelectasis. The lung bases are otherwise clear. The heart size is normal. Calcifications are present at the aortic valve. Atherosclerotic calcifications are present in the aorta. Hepatobiliary: No focal hepatic lesions are present. The liver edge is smooth. The common bile duct and gallbladder are within normal limits. Pancreas: No focal inflammatory change, mass, or cyst is present. There is no significant ductal dilation. Atrophy is noted. Spleen: Within normal limits. Adrenals/Urinary Tract: The adrenal glands are normal bilaterally. The right kidney in ureter are within normal limits. Mild perinephric laboratory changes are present. 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 An obstructing 12 mm stone is present at the left UPJ. The more distal left ureter is within normal limits. The urinary bladder is unremarkable. Stomach/Bowel: The stomach and duodenum are within normal limits. The remainder the small bowel is normal. The appendix is visualized and within normal limits. The ascending colon is within normal limits as well. The transverse and descending colon are normal. The rectosigmoid colon is unremarkable. There is moderate stool at the rectum. No obstruction is evident. Vascular/Lymphatic: Atherosclerotic changes are no throughout the aorta and branch vessels. There is no aneurysm. No significant adenopathy is present. Reproductive: Hysterectomy is noted. The ovaries are not discretely visualized and may be surgically absent as well. Other: A small paraumbilical hernia contains fat but no bowel. There is  no significant free fluid. Musculoskeletal: Dextro convex scoliosis is present. Multilevel degenerative change in stenosis is present. No focal lytic or blastic lesions are present. Right lateral listhesis is noted L3-4 with rightward slip of over half width of the vertebral bodies. IMPRESSION: 1. Obstructing 12 mm stone at the left UPJ with moderate left-sided hydronephrosis. 2. No other significant nephrolithiasis. 3. Marked inflammatory changes about the left greater than right kidney. 4. Small bilateral pleural effusions and associated atelectasis. 5. Atherosclerosis without aneurysm. 6. Scoliosis and marked spondylosis of the lumbar spine. 7. Small paraumbilical hernia contains fat but no bowel. Electronically Signed   By: Marin Roberts M.D.   On: 07/02/2016 19:55       Scheduled Meds: . antiseptic oral rinse  7 mL Mouth Rinse BID  . baclofen  10 mg Oral Daily  . carbidopa-levodopa  1.5 tablet Oral 6 X Daily  . cefTRIAXone (ROCEPHIN)  IV  1 g Intravenous STAT  . [START ON 07/05/2016] cefTRIAXone (ROCEPHIN)  IV  2 g Intravenous Q24H  . diazepam  2.5 mg Oral Daily  . DULoxetine  30 mg Oral Daily  . enoxaparin (LOVENOX) injection  40 mg Subcutaneous Q24H  . ferrous sulfate  325 mg Oral Q breakfast  . HYDROcodone-acetaminophen  0.5 tablet Oral 5 times per day  . HYDROcodone-acetaminophen  1 tablet Oral Q0600  . magnesium oxide  400 mg Oral Daily  . nystatin   Topical BID  . polyethylene glycol  17 g Oral Daily   Continuous Infusions: . sodium chloride 75 mL/hr at 07/04/16 1331     LOS: 2 days    Time spent: 25 minutes. Greater than 50% of this time was spent in direct contact with the patient coordinating care.     Barnetta Chapel, MD [facility] Pager [phone]  If 7PM-7AM, please contact night-coverage [email or web address] Password [email or web address] 07/04/2016, 4:28 PM

## 2016-07-05 ENCOUNTER — Encounter (HOSPITAL_COMMUNITY): Payer: Self-pay | Admitting: Urology

## 2016-07-05 DIAGNOSIS — A419 Sepsis, unspecified organism: Secondary | ICD-10-CM

## 2016-07-05 DIAGNOSIS — D72829 Elevated white blood cell count, unspecified: Secondary | ICD-10-CM

## 2016-07-05 DIAGNOSIS — R7881 Bacteremia: Secondary | ICD-10-CM

## 2016-07-05 DIAGNOSIS — N10 Acute tubulo-interstitial nephritis: Secondary | ICD-10-CM

## 2016-07-05 DIAGNOSIS — N179 Acute kidney failure, unspecified: Secondary | ICD-10-CM

## 2016-07-05 MED ORDER — CIPROFLOXACIN HCL 500 MG PO TABS: 500.0000 mg | ORAL_TABLET | Freq: Two times a day (BID) | ORAL | 0 refills | Status: DC

## 2016-07-05 MED ORDER — SACCHAROMYCES BOULARDII 250 MG PO CAPS: 250.0000 mg | ORAL_CAPSULE | Freq: Two times a day (BID) | ORAL | 3 refills | Status: DC

## 2016-07-05 NOTE — Progress Notes (Signed)
Pt continues to urinate frequently.  Half of the time she is incontinent and half the time we can get her to [facility] or [facility].  Urine is clear amber without odor.  Pt denies pain with urination.

## 2016-07-05 NOTE — Discharge Summary (Signed)
Physician Discharge Summary  Carol Mckee ZOX:[id number] DOB: 09-25-1941 DOA: 06/30/2016  PCP: Selinda Flavin, MD  Admit date: 06/30/2016 Discharge date: 07/05/2016  Time spent: 35 minutes  Recommendations for Outpatient Follow-up:  1. Follow-up with urology as an outpatient in 2 weeks. 2. With physical therapy at home  Discharge Diagnoses:  Principal Problem:   Acute pyelonephritis Active Problems:   Acute renal failure (ARF) (HCC)   Parkinson's disease (HCC)   Sepsis (HCC)   Fever   Leukocytosis   Gram-negative bacteremia [facility])   Discharge Condition: Stable  Diet recommendation: Regular  Filed Weights   06/30/16 0637 06/30/16 0829 07/03/16 1540  Weight: 95.3 kg (210 lb) 95.3 kg (210 lb) 85.8 kg (189 lb 2.5 oz)    History of present illness:  75 year old female with past history of Parkinson disease with a recent prolonged hospitalization due to Klebsiella UTI and sepsis presented on the day of admission with fevers chills and dysuria and transferred to [facility] as per request by urology.  Hospital Course:  Sepsis/gram-negative bacteremia due to Klebsiella with pyelonephritis: She was started on empiric Rocephin, CT scan of the abdomen and pelvis showed a left hydronephrosis with a UPJ stone urology was consulted who recommended cystoscopy with left ureteral stent placement and left retrograde pyelographic. Cultures and urine cultures grew Klebsiella sensitive to ciprofloxacin, her antibiotic regimen was Deescalated to Cipro which she will continue for a total of 2 weeks.  Acute kidney injury: Study due to infectious etiology resolved with IV fluids.  Parkinson's disease: No changes were made to her medication physical therapy evaluated the patient recommended home health PT.  Normocytic anemia: Chronic in nature hemoglobin has remained stable.  Procedures:  CT renal  Consultations:  urology  Discharge Exam: Vitals:   07/04/16 2307 07/05/16 0444   BP: (!) 140/55 (!) 138/48  Pulse: 85 78  Resp: 20 (!) 24  Temp: 98.6 F (37 C) 99.3 F (37.4 C)    General: A&O x3 Cardiovascular: RRR Respiratory: good air movement CTA B/L  Discharge Instructions   Discharge Instructions    Diet - low sodium heart healthy    Complete by:  As directed   Increase activity slowly    Complete by:  As directed     Current Discharge Medication List    START taking these medications   Details  ciprofloxacin (CIPRO) 500 MG tablet Take 1 tablet (500 mg total) by mouth 2 (two) times daily. Qty: 20 tablet, Refills: 0    saccharomyces boulardii (FLORASTOR) 250 MG capsule Take 1 capsule (250 mg total) by mouth 2 (two) times daily. Qty: 30 capsule, Refills: 3      CONTINUE these medications which have NOT CHANGED   Details  baclofen (LIORESAL) 10 MG tablet Take 10 mg by mouth daily.     carbidopa-levodopa (SINEMET IR) 25-100 MG tablet Take 1.5 tablets by mouth 6 (six) times daily. Qty: 810 tablet, Refills: 3   Associated Diagnoses: Parkinson's disease (HCC); OSA on CPAP; Midline low back pain without sciatica; Memory loss; Nocturia; Swelling of both lower extremities    Cholecalciferol (VITAMIN D3) 5000 UNITS CAPS Take 1 capsule by mouth daily.    diazepam (VALIUM) 5 MG tablet Take 0.5 tablets (2.5 mg total) by mouth daily. Qty: 15 tablet, Refills: 5   Associated Diagnoses: OSA on CPAP; Parkinson's disease (HCC)    DULoxetine (CYMBALTA) 30 MG capsule Take 30 mg by mouth daily.     ferrous sulfate 325 (65 FE) MG tablet Take  325 mg by mouth daily with breakfast.    HYDROcodone-acetaminophen (NORCO/VICODIN) 5-325 MG per tablet Take 0.5-1 tablets by mouth 6 (six) times daily. TAKES ONE TABLET IN THE MORNING AND TAKES ONE-HALF TABLET 5 TIMES DAILY    KRILL OIL PO Take 1 capsule by mouth daily.    magnesium oxide (MAG-OX) 400 MG tablet Take 400 mg by mouth daily.    Multiple Vitamins-Minerals (CENTRUM SILVER PO) Take 1 tablet by mouth daily.     polyethylene glycol (MIRALAX / GLYCOLAX) packet Take 17 g by mouth daily.    vitamin E 400 UNIT capsule Take 400 Units by mouth daily.    furosemide (LASIX) 20 MG tablet Take 20 mg by mouth daily as needed.    VOLTAREN 1 % GEL Apply 1 application topically daily as needed (for pain).       STOP taking these medications     cephALEXin (KEFLEX) 500 MG capsule        Allergies  Allergen Reactions  . Azithromycin Diarrhea  . Lyrica [Pregabalin]     Drops blood pressure  . Tizanidine Other (See Comments)    hypotension  . Erythromycin Diarrhea      The results of significant diagnostics from this hospitalization (including imaging, microbiology, ancillary and laboratory) are listed below for reference.    Significant Diagnostic Studies: Dg Chest Port 1 View  Result Date: 06/30/2016 CLINICAL DATA:  Fever EXAM: PORTABLE CHEST 1 VIEW COMPARISON:  June 29, 2016 FINDINGS: There is no edema or consolidation. Heart size and pulmonary vascularity are within normal limits. There is atherosclerotic calcification in the aorta. No adenopathy. There is degenerative change in each shoulder and in the thoracic spine. IMPRESSION: There is no edema or consolidation. There is aortic atherosclerosis. Electronically Signed   By: Bretta Bang III M.D.   On: 06/30/2016 07:09  Ct Renal Stone Study  Result Date: 07/02/2016 CLINICAL DATA:  Bilateral low back pain.  UTI.  Fever.  Bacteremia. EXAM: CT ABDOMEN AND PELVIS WITHOUT CONTRAST TECHNIQUE: Multidetector CT imaging of the abdomen and pelvis was performed following the standard protocol without IV contrast. COMPARISON:  Renal ultrasound 05/31/2016. FINDINGS: Lower chest: Small bilateral pleural effusions are present. There is associated atelectasis. The lung bases are otherwise clear. The heart size is normal. Calcifications are present at the aortic valve. Atherosclerotic calcifications are present in the aorta. Hepatobiliary: No focal hepatic  lesions are present. The liver edge is smooth. The common bile duct and gallbladder are within normal limits. Pancreas: No focal inflammatory change, mass, or cyst is present. There is no significant ductal dilation. Atrophy is noted. Spleen: Within normal limits. Adrenals/Urinary Tract: The adrenal glands are normal bilaterally. The right kidney in ureter are within normal limits. Mild perinephric laboratory changes are present. 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 An obstructing 12 mm stone is present at the left UPJ. The more distal left ureter is within normal limits. The urinary bladder is unremarkable. Stomach/Bowel: The stomach and duodenum are within normal limits. The remainder the small bowel is normal. The appendix is visualized and within normal limits. The ascending colon is within normal limits as well. The transverse and descending colon are normal. The rectosigmoid colon is unremarkable. There is moderate stool at the rectum. No obstruction is evident. Vascular/Lymphatic: Atherosclerotic changes are no throughout the aorta and branch vessels. There is no aneurysm. No significant adenopathy is present. Reproductive: Hysterectomy is noted. The ovaries are not discretely visualized and may be surgically absent as well. Other: A  small paraumbilical hernia contains fat but no bowel. There is no significant free fluid. Musculoskeletal: Dextro convex scoliosis is present. Multilevel degenerative change in stenosis is present. No focal lytic or blastic lesions are present. Right lateral listhesis is noted L3-4 with rightward slip of over half width of the vertebral bodies. IMPRESSION: 1. Obstructing 12 mm stone at the left UPJ with moderate left-sided hydronephrosis. 2. No other significant nephrolithiasis. 3. Marked inflammatory changes about the left greater than right kidney. 4. Small bilateral pleural effusions and associated atelectasis. 5. Atherosclerosis without aneurysm. 6. Scoliosis and marked  spondylosis of the lumbar spine. 7. Small paraumbilical hernia contains fat but no bowel. Electronically Signed   By: Marin Roberts M.D.   On: 07/02/2016 19:55   Microbiology: Recent Results (from the past 240 hour(s))  Blood Culture (routine x 2)     Status: Abnormal   Collection Time: 06/30/16  6:53 AM  Result Value Ref Range Status   Specimen Description BLOOD RIGHT FOREARM DRAWN BY RN  Final   Special Requests   Final    BOTTLES DRAWN AEROBIC AND ANAEROBIC AEB=7CC ANA=5CC   Culture  Setup Time   Final    GRAM NEGATIVE RODS Gram Stain Report Called to,Read Back By and Verified With: BUSICK,C ON 06/30/16 AT 2315 BY LOY,C PERFORMED AT [facility] CRITICAL RESULT CALLED TO, READ BACK BY AND VERIFIED WITH: CHRISTY BUSICK,RN @0530  07/01/16 MKELLY IN BOTH AEROBIC AND ANAEROBIC BOTTLES Performed at [facility]    Culture KLEBSIELLA PNEUMONIAE (A)  Final   Report Status 07/03/2016 FINAL  Final   Organism ID, Bacteria KLEBSIELLA PNEUMONIAE  Final      Susceptibility   Klebsiella pneumoniae - MIC*    AMPICILLIN >=32 RESISTANT Resistant     CEFAZOLIN <=4 SENSITIVE Sensitive     CEFEPIME <=1 SENSITIVE Sensitive     CEFTAZIDIME <=1 SENSITIVE Sensitive     CEFTRIAXONE <=1 SENSITIVE Sensitive     CIPROFLOXACIN <=0.25 SENSITIVE Sensitive     GENTAMICIN <=1 SENSITIVE Sensitive     IMIPENEM <=0.25 SENSITIVE Sensitive     TRIMETH/SULFA <=20 SENSITIVE Sensitive     AMPICILLIN/SULBACTAM >=32 RESISTANT Resistant     PIP/TAZO 32 INTERMEDIATE Intermediate     * KLEBSIELLA PNEUMONIAE  Blood Culture ID Panel (Reflexed)     Status: Abnormal   Collection Time: 06/30/16  6:53 AM  Result Value Ref Range Status   Enterococcus species NOT DETECTED NOT DETECTED Final   Vancomycin resistance NOT DETECTED NOT DETECTED Final   Listeria monocytogenes NOT DETECTED NOT DETECTED Final   Staphylococcus species NOT DETECTED NOT DETECTED Final   Staphylococcus aureus NOT DETECTED NOT DETECTED Final    Methicillin resistance NOT DETECTED NOT DETECTED Final   Streptococcus species NOT DETECTED NOT DETECTED Final   Streptococcus agalactiae NOT DETECTED NOT DETECTED Final   Streptococcus pneumoniae NOT DETECTED NOT DETECTED Final   Streptococcus pyogenes NOT DETECTED NOT DETECTED Final   Acinetobacter baumannii NOT DETECTED NOT DETECTED Final   Enterobacteriaceae species DETECTED (A) NOT DETECTED Final    Comment: CRITICAL RESULT CALLED TO, READ BACK BY AND VERIFIED WITH: C BUSICK,RN @0529  07/01/16 MKELLY    Enterobacter cloacae complex NOT DETECTED NOT DETECTED Final   Escherichia coli NOT DETECTED NOT DETECTED Final   Klebsiella oxytoca NOT DETECTED NOT DETECTED Final   Klebsiella pneumoniae DETECTED (A) NOT DETECTED Final    Comment: CRITICAL RESULT CALLED TO, READ BACK BY AND VERIFIED WITH: C BUSICK,RN @0529  07/01/16 MKELLY    Proteus  species NOT DETECTED NOT DETECTED Final   Serratia marcescens NOT DETECTED NOT DETECTED Final   Carbapenem resistance NOT DETECTED NOT DETECTED Final   Haemophilus influenzae NOT DETECTED NOT DETECTED Final   Neisseria meningitidis NOT DETECTED NOT DETECTED Final   Pseudomonas aeruginosa NOT DETECTED NOT DETECTED Final   Candida albicans NOT DETECTED NOT DETECTED Final   Candida glabrata NOT DETECTED NOT DETECTED Final   Candida krusei NOT DETECTED NOT DETECTED Final   Candida parapsilosis NOT DETECTED NOT DETECTED Final   Candida tropicalis NOT DETECTED NOT DETECTED Final    Comment: Performed at [facility]  Blood Culture (routine x 2)     Status: Abnormal   Collection Time: 06/30/16  7:15 AM  Result Value Ref Range Status   Specimen Description BLOOD RIGHT FOREARM DRAWN BY RN LR  Final   Special Requests BOTTLES DRAWN AEROBIC AND ANAEROBIC 10CC EACH  Final   Culture  Setup Time   Final    GRAM NEGATIVE RODS Gram Stain Report Called to,Read Back By and Verified With: BUSICK,C  ON 06/30/16 AT 2315 BY LOY,C PERFORMED AT [facility] IN BOTH  AEROBIC AND ANAEROBIC BOTTLES    Culture (A)  Final    KLEBSIELLA PNEUMONIAE SUSCEPTIBILITIES PERFORMED ON PREVIOUS CULTURE WITHIN THE LAST 5 DAYS. Performed at [facility]    Report Status 07/03/2016 FINAL  Final  Urine culture     Status: Abnormal   Collection Time: 06/30/16  7:30 AM  Result Value Ref Range Status   Specimen Description URINE, CATHETERIZED  Final   Special Requests NONE  Final   Culture >=100,000 COLONIES/mL KLEBSIELLA PNEUMONIAE (A)  Final   Report Status 07/02/2016 FINAL  Final   Organism ID, Bacteria KLEBSIELLA PNEUMONIAE (A)  Final      Susceptibility   Klebsiella pneumoniae - MIC*    AMPICILLIN >=32 RESISTANT Resistant     CEFAZOLIN <=4 SENSITIVE Sensitive     CEFTRIAXONE <=1 SENSITIVE Sensitive     CIPROFLOXACIN <=0.25 SENSITIVE Sensitive     GENTAMICIN <=1 SENSITIVE Sensitive     IMIPENEM <=0.25 SENSITIVE Sensitive     NITROFURANTOIN 128 RESISTANT Resistant     TRIMETH/SULFA <=20 SENSITIVE Sensitive     AMPICILLIN/SULBACTAM >=32 RESISTANT Resistant     PIP/TAZO 64 INTERMEDIATE Intermediate     * >=100,000 COLONIES/mL KLEBSIELLA PNEUMONIAE     Labs: Basic Metabolic Panel:  Recent Labs Lab 06/30/16 0653 07/01/16 0441 07/03/16 0700  NA 135 139 135  K 4.6 4.1 3.7  CL 99* 106 106  CO2 27 25 26   GLUCOSE 146* 123* 122*  BUN 16 16 14   CREATININE 1.12* 0.90 0.69  CALCIUM 8.3* 8.0* 7.8*   Liver Function Tests:  Recent Labs Lab 06/30/16 0653  AST 18  ALT <5*  ALKPHOS 72  BILITOT 0.6  PROT 7.4  ALBUMIN 2.7*   No results for input(s): LIPASE, AMYLASE in the last 168 hours. No results for input(s): AMMONIA in the last 168 hours. CBC:  Recent Labs Lab 06/30/16 0653 07/01/16 0441 07/03/16 0700  WBC 16.8* 12.9* 9.3  NEUTROABS 14.0*  --   --   HGB 8.7* 7.7* 8.3*  HCT 27.1* 24.1* 25.8*  MCV 95.1 95.3 93.1  PLT 422* 350 354   Cardiac Enzymes:  Recent Labs Lab 06/30/16 0653  TROPONINI <0.03   BNP: BNP (last 3  results) No results for input(s): BNP in the last 8760 hours.  ProBNP (last 3 results) No results for input(s): PROBNP in the  last 8760 hours.  CBG: No results for input(s): GLUCAP in the last 168 hours.     Signed:  Marinda Elk MD.  [facility] 07/05/2016, 9:56 AM

## 2016-07-05 NOTE — Progress Notes (Addendum)
Patient ID: Carol Mckee, female   DOB: 08-01-1941, 75 y.o.   MRN: [id number]  2 Days Post-Op Subjective: Afebrile with stable vitals. Overall feels well On Ceftriaxone. All of her prior back pain has improved. Some increased urinary frequency but tolerable.  Objective: Vital signs in last 24 hours: Temp:  [98.6 F (37 C)-99.3 F (37.4 C)] 99.3 F (37.4 C) (07/31 0444) Pulse Rate:  [78-85] 78 (07/31 0444) Resp:  [18-24] 24 (07/31 0444) BP: (138-140)/(48-55) 138/48 (07/31 0444) SpO2:  [95 %-97 %] 97 % (07/31 0444)  Intake/Output from previous day: 07/30 0701 - 07/31 0700 In: 2155 [P.O.:1180; I.V.:975] Out: 502 [Urine:500; Stool:2] Intake/Output this shift: Total I/O In: 1795 [P.O.:820; I.V.:975] Out: 502 [Urine:500; Stool:2]  Physical Exam:  General: Alert and oriented Abdomen: Soft, ND, NT   Lab Results:  Recent Labs  07/03/16 0700  HGB 8.3*  HCT 25.8*   BMET  Recent Labs  07/03/16 0700  NA 135  K 3.7  CL 106  CO2 26  GLUCOSE 122*  BUN 14  CREATININE 0.69  CALCIUM 7.8*     Studies/Results:  Assessment/Plan: S/P left ureteral stent on 7/29 for 12 mm left UPJ stone and bacteremia  - Can change antibiotic to po ciprofloxacin.  Will need total of 2 weeks of treatment. - Ok for discharge from urologic standpoint at any time - Will arrange outpatient follow up in about 2-3 weeks to assess for resolution of UTI and to discuss definitive treatment options for stone - Please call if further questions   LOS: 3 days   Carol Mckee 07/05/2016, 6:54 AM

## 2016-07-21 ENCOUNTER — Other Ambulatory Visit: Payer: Self-pay | Admitting: Urology

## 2016-07-22 ENCOUNTER — Encounter (HOSPITAL_COMMUNITY): Payer: Self-pay | Admitting: *Deleted

## 2016-07-22 ENCOUNTER — Other Ambulatory Visit: Payer: Self-pay | Admitting: Urology

## 2016-07-23 NOTE — H&P (Signed)
CC/HPI: Left proximal ureteral stone    Ms. Carol Mckee presents today after having presented to the hospital approximately 2 weeks ago with a 1.2 cm left proximal ureteral stone with obstruction and urinary tract infection. She underwent left ureteral stent placement. She was treated with 2 weeks of ciprofloxacin for culture sensitive antibiotic therapy and has completed this treatment. She denies any recent fever. She has been tolerating her stent well and without pain.     ALLERGIES: Erythromycin Lyrica CAPS Tizanidine Hcl    MEDICATIONS: Baclofen 10 mg tablet  Carbidopa-Levodopa 25 mg-100 mg tablet  Diazepam 5 mg tablet  Duloxetine Hcl 30 mg capsule,delayed release  Ferrous Sulfate  Florejen3  Furosemide 20 mg tablet  Glucosamine  Hydrocodone-Acetaminophen 10 mg-325 mg tablet  Krill Oil  Magnesium Oxide  Miralax  Multivitamin  Vitamin D3     GU PSH: Cystoscopy Insert Stent, Left - 07/03/2016    NON-GU PSH: Breast Surgery Procedure, biopsy - about 2001 Colonoscopy, x 3 Excise Sacral Spine Tumor - 1958 Laparoscopic Supracervical Hysterectomy (subtotal Hysterectomy), With Or Without Removal Of Tube(s), - about 2000 Tonsillectomy    GU PMH: None   NON-GU PMH: Anxiety disorder due to known physiological condition Sleep apnea, unspecified    FAMILY HISTORY: Congestive Heart Failure - Father Ischemic Stroke - Mother   SOCIAL HISTORY: None   REVIEW OF SYSTEMS:    GU Review Female:   Patient reports get up at night to urinate, hard to postpone urination, frequent urination, and leakage of urine. Patient denies have to strain to urinate , trouble starting your streams, and stream starts and stops.  Gastrointestinal (Lower):   Patient denies diarrhea and constipation.  Gastrointestinal (Upper):   Patient denies nausea and vomiting.  Constitutional:   Patient reports weight loss. Patient denies fever, night sweats, and fatigue.  Skin:   Patient denies skin rash/ lesion and  itching.  Eyes:   Patient denies blurred vision and double vision.  Ears/ Nose/ Throat:   Patient denies sore throat and sinus problems.  Hematologic/Lymphatic:   Patient denies swollen glands and easy bruising.  Cardiovascular:   Patient reports leg swelling. Patient denies chest pains.  Respiratory:   Patient denies cough and shortness of breath.  Endocrine:   Patient denies excessive thirst.  Musculoskeletal:   Patient reports back pain. Patient denies joint pain.  Neurological:   Patient denies headaches and dizziness.  Psychologic:   Patient denies depression and anxiety.   VITAL SIGNS:      07/20/2016 03:10 PM  Weight 177 lb / 80.29 kg  Height 66.5 in / 168.91 cm  BP 121/70 mmHg  Pulse 76 /min  BMI 28.1 kg/m   MULTI-SYSTEM PHYSICAL EXAMINATION:    Constitutional: Well-nourished. No physical deformities. Normally developed. Good grooming.  Neck: Neck symmetrical, not swollen. Normal tracheal position.  Respiratory: No labored breathing, no use of accessory muscles.   Cardiovascular: Normal temperature, normal extremity pulses, no swelling, no varicosities.  Gastrointestinal: No mass, no tenderness, no rigidity, non obese abdomen.     PAST DATA REVIEWED:  Source Of History:  Patient  Urine Test Review:   Urinalysis  X-Ray Review: KUB: Reviewed Films.    Notes:                     I reviewed her KUB x-ray. This demonstrates that her stone is migrated into the lower pole of kidney. Does measure 1.3 cm on KUB x-ray today.   PROCEDURES:  KUB - 74000  A single view of the abdomen is obtained.               Urinalysis w/Scope - 81001 Dipstick Dipstick Cont'd Micro  Specimen: Voided Bilirubin: Neg WBC/hpf: 10-20/hpf  Color: Yellow Ketones: Neg RBC/hpf: 3-10/hpf  Appearance: Clear Blood: 1+ Bacteria: Few (10-25/hpf)  Specific Gravity: 1.020 Protein: 1+ Cystals: NS (Not Seen)  pH: 6.0 Urobilinogen: 0.2 Casts: NS (Not Seen)  Glucose: Neg Nitrites: Neg Trichomonas:  Not Present    Leukocyte Esterase: 3+ Mucous: Not Present      Epithelial Cells: 0-5/hpf      Yeast: NS (Not Seen)      Sperm: Not Present    ASSESSMENT:      ICD-10 Details  1 GU:   Calculus Ureter - N20.1    PLAN:           Orders Labs Urine Culture and Sensitivity  X-Rays: KUB          Schedule Return Visit: Next Available Appointment - Schedule Surgery          Document Letter(s):  Created for Patient: Clinical Summary         Notes:   1. Left renal calculus: I reviewed her KUB with her and her family members today. We discussed the fact that the stone has moved into a nonobstructing position in the left kidney. We discussed options including removal of her stent and expectant management versus ureteroscopic laser lithotripsy versus shockwave lithotripsy. After reviewing the pros and cons of each approach, she is not interested in expectant management considering her recent infection and does not wish to take on the risk of this occurring again. After reviewing options for treatment, she is interested in trying to reduce the risk of treatment as much as possible would like to avoid an anesthetic if possible.   She therefore will be scheduled for left ESWL for treatment. We have reviewed the potential risks, complications, expected success rate, and expected recovery process. All questions were answered to her stated satisfaction.   She will be scheduled to see me in a couple of weeks after her treatment with a KUB for possible stent removal at that time.   After a thorough review of the management options for the patient's condition the patient  elected to proceed with surgical therapy as noted above. We have discussed the potential benefits and risks of the procedure, side effects of the proposed treatment, the likelihood of the patient achieving the goals of the procedure, and any potential problems that might occur during the procedure or recuperation. Informed consent has  been obtained.

## 2016-07-26 ENCOUNTER — Ambulatory Visit (HOSPITAL_COMMUNITY): Admission: RE | Admit: 2016-07-26 | Payer: Medicare Other | Source: Ambulatory Visit | Admitting: Urology

## 2016-07-26 ENCOUNTER — Other Ambulatory Visit: Payer: Self-pay | Admitting: Urology

## 2016-07-26 HISTORY — DX: Chronic kidney disease, unspecified: N18.9

## 2016-07-26 HISTORY — DX: Sleep apnea, unspecified: G47.30

## 2016-07-26 HISTORY — DX: Tubulo-interstitial nephritis, not specified as acute or chronic: N12

## 2016-07-26 SURGERY — LITHOTRIPSY, ESWL
Anesthesia: LOCAL | Laterality: Left

## 2016-08-10 ENCOUNTER — Encounter (HOSPITAL_COMMUNITY): Payer: Self-pay | Admitting: *Deleted

## 2016-08-16 ENCOUNTER — Encounter (HOSPITAL_COMMUNITY): Payer: Self-pay | Admitting: General Practice

## 2016-08-16 ENCOUNTER — Encounter (HOSPITAL_COMMUNITY)
Admission: RE | Disposition: A | Discharge status: Home / Self Care | Payer: Self-pay | Source: Ambulatory Visit | Attending: Urology

## 2016-08-16 ENCOUNTER — Ambulatory Visit (HOSPITAL_COMMUNITY)
Admission: RE | Admit: 2016-08-16 | Discharge: 2016-08-16 | Disposition: A | Discharge status: Home / Self Care | Payer: Medicare Other | Source: Ambulatory Visit | Attending: Urology | Admitting: Urology

## 2016-08-16 ENCOUNTER — Ambulatory Visit (HOSPITAL_COMMUNITY): Payer: Medicare Other

## 2016-08-16 DIAGNOSIS — Z79899 Other long term (current) drug therapy: Secondary | ICD-10-CM | POA: Diagnosis not present

## 2016-08-16 DIAGNOSIS — Z79891 Long term (current) use of opiate analgesic: Secondary | ICD-10-CM | POA: Diagnosis not present

## 2016-08-16 DIAGNOSIS — G20 Parkinson's disease: Secondary | ICD-10-CM | POA: Diagnosis not present

## 2016-08-16 DIAGNOSIS — N200 Calculus of kidney: Secondary | ICD-10-CM

## 2016-08-16 DIAGNOSIS — G4730 Sleep apnea, unspecified: Secondary | ICD-10-CM | POA: Insufficient documentation

## 2016-08-16 SURGERY — LITHOTRIPSY, ESWL
Anesthesia: LOCAL | Laterality: Left

## 2016-08-16 MED ORDER — CIPROFLOXACIN HCL 500 MG PO TABS
500.0000 mg | ORAL_TABLET | ORAL | Status: AC
  Administered 2016-08-16: 500 mg via ORAL
  Filled 2016-08-16: qty 1

## 2016-08-16 MED ORDER — DIAZEPAM 5 MG PO TABS
10.0000 mg | ORAL_TABLET | ORAL | Status: AC
  Administered 2016-08-16: 10 mg via ORAL
  Filled 2016-08-16: qty 2

## 2016-08-16 MED ORDER — SODIUM CHLORIDE 0.9 % IV SOLN
INTRAVENOUS | Status: DC
  Administered 2016-08-16: 10:00:00 via INTRAVENOUS

## 2016-08-16 MED ORDER — DIPHENHYDRAMINE HCL 25 MG PO CAPS
25.0000 mg | ORAL_CAPSULE | ORAL | Status: AC
  Administered 2016-08-16: 25 mg via ORAL
  Filled 2016-08-16: qty 1

## 2016-10-18 ENCOUNTER — Other Ambulatory Visit: Payer: Self-pay | Admitting: Neurology

## 2016-10-18 DIAGNOSIS — M545 Low back pain, unspecified: Secondary | ICD-10-CM

## 2016-10-18 DIAGNOSIS — R413 Other amnesia: Secondary | ICD-10-CM

## 2016-10-18 DIAGNOSIS — R351 Nocturia: Secondary | ICD-10-CM

## 2016-10-18 DIAGNOSIS — M7989 Other specified soft tissue disorders: Secondary | ICD-10-CM

## 2016-10-18 DIAGNOSIS — G20 Parkinson's disease: Secondary | ICD-10-CM

## 2016-10-18 DIAGNOSIS — Z9989 Dependence on other enabling machines and devices: Secondary | ICD-10-CM

## 2016-10-18 DIAGNOSIS — G4733 Obstructive sleep apnea (adult) (pediatric): Secondary | ICD-10-CM

## 2016-11-02 ENCOUNTER — Ambulatory Visit (INDEPENDENT_AMBULATORY_CARE_PROVIDER_SITE_OTHER): Payer: Medicare Other | Admitting: Neurology

## 2016-11-02 ENCOUNTER — Encounter: Payer: Self-pay | Admitting: Neurology

## 2016-11-02 VITALS — BP 150/76 | HR 86 | Resp 16 | Ht 62.0 in | Wt 174.0 lb

## 2016-11-02 DIAGNOSIS — M545 Low back pain, unspecified: Secondary | ICD-10-CM

## 2016-11-02 DIAGNOSIS — Z9989 Dependence on other enabling machines and devices: Secondary | ICD-10-CM

## 2016-11-02 DIAGNOSIS — G8929 Other chronic pain: Secondary | ICD-10-CM

## 2016-11-02 DIAGNOSIS — M7989 Other specified soft tissue disorders: Secondary | ICD-10-CM | POA: Diagnosis not present

## 2016-11-02 DIAGNOSIS — G4733 Obstructive sleep apnea (adult) (pediatric): Secondary | ICD-10-CM

## 2016-11-02 DIAGNOSIS — G20 Parkinson's disease: Secondary | ICD-10-CM

## 2016-11-02 DIAGNOSIS — G20A1 Parkinson's disease without dyskinesia, without mention of fluctuations: Secondary | ICD-10-CM

## 2016-11-02 NOTE — Patient Instructions (Signed)
Let's keep your medications the same.  Your exam is stable.

## 2016-11-02 NOTE — Progress Notes (Signed)
Subjective:    Patient ID: Carol Mckee is a 75 y.o. female.  HPI     Interim history:   Carol Mckee is a very pleasant 75 year old ambidextrous female with an underlying medical history of lumbar spinal stenosis, hip pain, status post hiatal hernia surgery, kidney stone surgery, bladder surgery, hysterectomy, breast biopsy, tonsillectomy and adenoidectomy, who presents for followup consultation of her advanced, right-sided predominant Parkinson's disease, complicated by memory loss, LBP, bladder incontinence and gait disorder and OSA, on CPAP. She is accompanied by her husband today. I last saw her on 06/28/2016, at which time : She repo she reported ongoing low back pain, was taking hydrocodone 1 pill in the morning and in the evening and half a pill 4 times a day in between, was followed by a new pain management doctor. She had a hospitalization in the interim secondary to altered mental status and was found to have a urinary tract infection. She was admitted to the hospital on 05/26/2016 and discharged on 06/02/2016, was found to have urosepsis, treated with anti-biotics IV. She had a head CT without contrast upon admission on 05/26/2016 and I reviewed the findings: IMPRESSION: Image quality degraded by motion  Left frontal hypodensities could represent acute or chronic ischemia. Correlate with symptoms and MRI. Progressive chronic ischemic changes in the left basal ganglia. Patient and her family declined an MRI brain as I understand. Finished HH therapy.  Today, 11/02/2016: She reports feeling better. Unfortunately she had another UTI and urosepsis in July right after our last visit, was hospitalized for pyelonephritis from 06/30/16 to 07/05/16. She then had a more recent elective admission in September for L sided lithotripsy on 08/16/16. Has lost weight in the wake of all of this. She is compliant with CPAP but takes it off around 5 AM, as she is bothered by it. She continues to take  hydrocodone, about 4 pills per day. I reviewed her CPAP compliance data from 10/02/2016 through 10/31/2016, during which time she was under percent compliant as far CPAP usage, percentage of time above 4 hours is 97%, residual AHI tends to fluctuate, leak low. She uses a fullface mask and has had some dental problems. Her dentist suspects that it is because of her CPAP mask. She will need some bite guard at some point. It is difficult for her to chew certain foods.   Previously:   I saw her on 02/25/2016, at which time she was compliant with CPAP therapy. She reported pain while turning in bed, nocturia, no recent UTI. She sees a Dealer in [place]. Memory was stable. She had no recent fall. She needed new CPAP supplies. Her MMSE was 29/30, CDT: 4/4, AFT: 10/min at the time. I suggested we try to increase Sinemet to 1-1/2 pills 6 times a day, every 3 hours, starting at 5 AM.   I reviewed her CPAP compliance data from 05/25/2016 through 06/23/2016 which is a total of 30 days during which time she used her machine 23 days with percent used days greater than 4 hours at 63%, indicating suboptimal compliance because of a gap of one week in June. Average usage of 5 hours and 8 minutes, residual AHI borderline at 6.7, leak at times high with the 95th percentile at 29.2 L/m at a pressure of 15 cm with EPR of 3.    I saw her on 08/28/2015, at which time she reported ongoing issues with low back pain. She also had right shoulder pain. She had severe limitation in her range  of motion. She was using a 2 wheeled walker. She was sleeping and in hospital bed. In the early morning she would shift into a lift chair. She would have to get up multiple times at night for bathroom breaks, up to 6 times per night, her husband was having more health issues himself of the past 1 year. She was trying to be compliant with CPAP therapy but AHI and leak were both high. I suggested she continue with Sinemet at the current dose, 1-1/2  pills 5 times a day.   I reviewed her CPAP compliance data from 01/25/2016 through 02/23/2016 which is a total of 30 days during which time she used her machine every night with 100% compliance, average usage of 9 hours and 43 minutes, residual AHI 26.7 per hour, leaked high at 49.8 L/m for the 95th percentile on a pressure of 14 cm with EPR of 3. Residual apneas appeared to be obstructive in nature.   I saw her on 02/25/2015, at which time she reported that she was continuing to struggle with the CPAP mask. She had trouble closing her mouth. She was using a fullface mask with a chinstrap. Unfortunately, she had fallen from her recliner in December. She had been in her lift chair and fell asleep and accidentally pushed the control button. She fell forward onto her face. She did not have any LOC or residual headaches. She was on 7 1/2 pills of Sinemet daily. She was having ongoing issues with low back pain. In the past she had injection treatment with variable results, under Dr. Francesco Runner for this. She was on hydrocodone 3 times a day. She had nocturia multiple times a night which was very bothersome to her. She felt her memory and mood were stable.    I reviewed her CPAP compliance data from 07/27/2015 through 08/25/2015 which is a total of 30 days during which time she used her machine every night, with percent used days greater than 4 hours at 100%, indicating superb compliance with an average usage of 10 hours and 27 minutes, residual AHI high at 27.1 per hour, leak I with the 95th percentile at 54.5 L/m on a pressure of 14 cm with EPR of 3. Looking at her AHI breakdown, it looks like her residual sleep disordered breathing is primarily obstructive in nature. She was supposed to switch to AutoPap therapy but appears to be still on the same setting of 14 cm.   I saw her on 08/26/2014, at which time she had some residual snoring according to her husband. She was using a fullface mask. Parkinson's-wise, she  felt stable. I changed her from AutoPap to a set pressure of 14 cm.    I reviewed her CPAP compliance data from 01/25/2015 through 02/23/2015 which is a total of 30 days during which time she used her machine every night with percent used days greater than 4 hours at 100%, indicating superb compliance on her behalf with an average usage of 9 hours and 34 minutes and pressure has now been changed to 14 cm with EPR of 3 but AHI was suboptimal at 14.4, probably due to very high leak with the 95th percentile of leak at 78.6 L/m.     I saw her on 05/02/2014, at which time I talked her about her recent sleep study results and her CPAP treatment. She had done well with that. I changed her from AutoPap to CPAP of 14 cm. She changed from a nasal mask to a full face  mask. I asked her to not drive anymore. I asked her to continue with her Parkinson's medications. I changed the timing to 6, 10, 2, 6 PM and 10 PM and I encouraged her to continue using CPAP regularly.   I reviewed her compliance data from 07/24/2014 through 08/22/2014 which is a total of 30 days during which time she was 100% compliant. Her residual AHI was unfortunately high at 23.5. 95th percentile pressure was 12.8 but leak was very high at times. She is supposed to be on CPAP but is still on AutoPap from 8-13 with EPR of 3.   I saw her on 02/21/2014, at which time we talked about her signs and symptoms concerning for sleep apnea and I suggested a sleep study. I also advised her to try to reduce sedating medications including her Valium as well as her hydrocodone. She was advised to use her walker at all times. She felt that physical therapy was helpful. She was asking if she could drive. She had been to the [facility] office, and was discouraged from driving. She sees Dr. Francesco Runner in [place], [place] for pain. She had been taking norco 1 pill tid and 1/2 pill bid, alternating. She is taking C/L 1 1/2 pills 5 times a day.   She has sleep study on 02/27/2014 and went  over her test results with her in detail today. Her baseline sleep efficiency was 100% with 100% of stage II sleep only. She had no significant EKG changes or periodic leg movements of sleep. Her total AHI was high at 54.5 per hour. Baseline oxygen saturation was 94%, nadir was 86%. She was titrated on CPAP from 5-8 centimeters of water pressure. She was noted to have central apneas. She was noted to have mouth opening. On a pressure of 8 cm a residual AHI was 6.5 per hour. She had no REM sleep. I placed the patient on CPAP of 9 cm. 03/20/2014 to 04/08/2014, which is a total of 21 days, during which time the patient used CPAP every day. The average usage for all days was 10 hours and 10 minutes. The percent used days greater than 4 hours was 100 %, indicating superb compliance. The residual AHI was unfortunately high at 22.4 per hour, indicating an inadequate treatment pressure of 9 cwp with EPR of 3. Her residual AHI is primarily obstructive in nature per AHI breakdown. Air leak from the mask was also elevated with 38.1 L per minute at the 95th percentile. Earlier this month based on these results I placed her on AutoPap as opposed to CPAP.    I reviewed the patient's PAP compliance data from 04/16/2014 to 05/01/2014, which is a total of 16 days, during which time the patient used CPAP every day. The average usage for all days was 10 hours and 12 minutes. The percent used days greater than 4 hours was 100 %, indicating superb compliance. The residual AHI was unfortunately elevated at 14.9 per hour. The pressure setting is from 8-13 cm. 95th percentile pressure was 12.9. Her leak was at times high.   I saw her on 10/04/2013, at which time I did not make changes to her medications but made a referral to physical therapy and she wanted to stay locally in [place], [place], for this. We talked about potentially starting her on Exelon for memory loss.   I first met her on 05/01/2013, which time I suggested that she increase  her Sinemet to 1-1/2 pills 5 times a day. I also reinforced  the importance of using her walker at all times as she was at fall risk. She called back in August 2014 and reported an improvement in her symptoms.   She previously followed with Dr. Morene Antu and was last seen by him on 01/01/2013, at which time he felt that she would benefit from physical therapy due to her lumbar spine stenosis with chronic lower back pain. He also increased her carbidopa-levodopa to 1-1/2 pills at 5 AM, one pill at 9 AM, one pill at 1 PM, 1 1/2 pills at 5 PM and one pill at 9 PM. Her falls assessment tool score at the time was 18.   She has an approximately 12-year history of Parkinson's disease treated with carbidopa-levodopa 25-100 mg strength 5 times daily. She developed lower back pain and was treated with hydrocodone, oxycodone and fentanyl patch. She also received epidural steroid injections. She had an EEG in May 2011, which did not show any epileptiform discharges. MRI L-spine in March 2013 showed scoliosis, and severe degenerative joint disease. She has had jerking movements in her legs. This does not occur while lying down but mostly with sitting or standing. She has developed bladder incontinence. In January 2014 her MMSE was 22, clock drawing was 4, animal fluency was 8.   She has a hospital bed with rails up at night. He helps her to use a bed pan. She has to urinate 2-5 times per night. She can walk about 15 feet at a time with 2 wheeled walker. She denies VH or AH and has not had dyskinesias. She lives close to [place] and had PT there. She had epidural injection at [facility] in the past, and at [facility] with good relief.    Her Past Medical History Is Significant For: Past Medical History:  Diagnosis Date  . Abnormality of gait   . Chronic kidney disease   . Edema   . Fatigue   . Other chronic pulmonary heart diseases   . Paralysis agitans ([place])   . Parkinson disease ([place])   . Parkinson's disease ([place])  10/04/2013  . Pyelonephritis 06/2016   pt states hospitalized for kidney infection/sepsis  . Sleep apnea    pt does use cpap  . Spinal stenosis   . Stiffness of joint, not elsewhere classified,  shoulder region     Her Past Surgical History Is Significant For: Past Surgical History:  Procedure Laterality Date  . ABDOMINAL HYSTERECTOMY    . COLONOSCOPY     x3  . CYSTOSCOPY WITH STENT PLACEMENT Left 07/03/2016   Procedure: CYSTOSCOPY WITH STENT PLACEMENT;  Surgeon: Raynelle Bring, MD;  Location: [facility];  Service: Urology;  Laterality: Left;    Her Family History Is Significant For: No family history on file.  Her Social History Is Significant For: Social History   Social History  . Marital status: Married    Spouse name: Pilar Plate  . Number of children: 3  . Years of education: 14   Occupational History  . RETIRED    Social History Main Topics  . Smoking status: Never Smoker  . Smokeless tobacco: Never Used  . Alcohol use No  . Drug use: No  . Sexual activity: Not Asked   Other Topics Concern  . None   Social History Narrative   Patient is right handed.resides in a home with husband    Her Allergies Are:  Allergies  Allergen Reactions  . Azithromycin Diarrhea  . Lyrica [Pregabalin]     Drops blood pressure  .  Tizanidine Other (See Comments)    hypotension  . Erythromycin Diarrhea  :   Her Current Medications Are:  Outpatient Encounter Prescriptions as of 11/02/2016  Medication Sig  . baclofen (LIORESAL) 10 MG tablet Take 10 mg by mouth daily.   . carbidopa-levodopa (SINEMET IR) 25-100 MG tablet Take 1.5 tablets by mouth 6 (six) times daily.  . Cholecalciferol (VITAMIN D3) 5000 UNITS CAPS Take 1 capsule by mouth daily.  . ciprofloxacin (CIPRO) 500 MG tablet Take 1 tablet (500 mg total) by mouth 2 (two) times daily.  . DULoxetine (CYMBALTA) 30 MG capsule Take 30 mg by mouth daily.   . furosemide (LASIX) 20 MG tablet Take 20 mg by mouth daily as needed.  Marland Kitchen  HYDROcodone-acetaminophen (NORCO/VICODIN) 5-325 MG per tablet Take 0.5-1 tablets by mouth 6 (six) times daily. TAKES ONE TABLET IN THE MORNING AND TAKES ONE-HALF TABLET 5 TIMES DAILY  . KRILL OIL PO Take 1 capsule by mouth daily.  . magnesium oxide (MAG-OX) 400 MG tablet Take 400 mg by mouth daily.  . polyethylene glycol (MIRALAX / GLYCOLAX) packet Take 17 g by mouth daily.  . vitamin E 400 UNIT capsule Take 400 Units by mouth daily.  . VOLTAREN 1 % GEL Apply 1 application topically daily as needed (for pain).   . [DISCONTINUED] diazepam (VALIUM) 5 MG tablet Take 0.5 tablets (2.5 mg total) by mouth daily.  . [DISCONTINUED] ferrous sulfate 325 (65 FE) MG tablet Take 325 mg by mouth daily with breakfast.  . [DISCONTINUED] Multiple Vitamins-Minerals (CENTRUM SILVER PO) Take 1 tablet by mouth daily.  . [DISCONTINUED] saccharomyces boulardii (FLORASTOR) 250 MG capsule Take 1 capsule (250 mg total) by mouth 2 (two) times daily.   No facility-administered encounter medications on file as of 11/02/2016.   :  Review of Systems:  Out of a complete 14 point review of systems, all are reviewed and negative with the exception of these symptoms as listed below: Review of Systems  Genitourinary:       Patient states that she was in the hospital with kidney stones since last visit.   Neurological:       No new concerns.     Objective:  Neurologic Exam  Physical Exam Physical Examination:   Vitals:   11/02/16 1302  BP: (!) 150/76  Pulse: 86  Resp: 16   General Examination: The patient is a very pleasant 75 y.o. female in no acute distress. She is situated in a WC. She reports right sided back pain, unchanged.    HEENT: Normocephalic, atraumatic, pupils are equal, round and reactive to light and accommodation. Funduscopy is normal. Mild b/l cataracts are noted. She has corrective eye glasses. Extraocular tracking shows moderate saccadic breakdown without nystagmus noted. There is limitation to  upper gaze. There is moderate decrease in eye blink rate. Hearing is mildly impaired. Face is symmetric with moderate facial masking and normal facial sensation. There is a mild lower lip tremor. Neck is moderately to severely rigid with intact passive ROM. There are no carotid bruits on auscultation. Oropharynx exam reveals moderate mouth dryness. No significant airway crowding is noted but she does have a floppy appearing soft palate. Mallampati is class II. Tongue protrudes centrally and palate elevates symmetrically. There is mild drooling.   Chest: is clear to auscultation without wheezing, rhonchi or crackles noted.  Heart: sounds are regular and normal without murmurs, rubs or gallops noted.   Abdomen: is soft, non-tender and non-distended with normal bowel sounds appreciated on auscultation.  Extremities: There is 1+ pitting edema in the distal lower extremities bilaterally, right more than left. Chronic stasis-like changes are noted in the distal legs bilaterally. There are no varicose veins, there is distal redness.  Skin: is warm and dry with no trophic changes noted. Age-related changes are noted on the skin.    Musculoskeletal: exam reveals no obvious joint deformities, tenderness, joint swelling or erythema, except severe decrease in ROM in R shoulder.  Neurologically:  Mental status: The patient is awake and alert, paying fair attention. She is able to partially provide the history. Her husband provides most of the history. She is oriented to: person, place, situation, day of week, month of year and year, not date. Her memory, attention, language and knowledge are mildly impaired. There is no aphasia, agnosia, apraxia or anomia. There is a mild degree of bradyphrenia.   On 08/26/2014: MMSE 25/30, CDT 2/4, AFT: 9.   On 02/25/2015: MMSE: 27/30, CDT: 3/4, AFT: 8/min, GDS: 2/15.  On 08/28/2015: MMSE: 26/30, CDT: 3/4, AFT: 5/min.   On 02/25/2016: MMSE: 29/30, CDT: 4/4, AFT: 10/min.  On  11/02/2016: MMSE: 29/30, CDT: 4/4, AFT: 13/min.  Speech is mild to moderately hypophonic with mild to moderate dysarthria noted. Mood is congruent and affect is normal.  Cranial nerves are as described above under HEENT exam. In addition, shoulder shrug is normal with equal shoulder height noted.  Motor exam: Normal bulk, and strength is 4/5, with limitation in ROM in the R shoulder, unchanged. There are no dyskinesias noted.  Tone is moderately rigid with presence of cogwheeling in the right upper extremity. There is overall moderate bradykinesia. There is no drift or rebound.  There is an intermittent mild to moderate resting tremor in the right upper extremity and a mild resting tremor in the left upper extremity. The tremor is intermittent. There is a slight intermittent resting tremor in both legs.  Romberg is not testable safely.  Reflexes are 1+ in the upper extremities and trace in the lower extremities. Fine motor skills exam: Finger taps are moderately to severely impaired on the right and moderately impaired on the left. Hand movements are moderately impaired on the right and moderately impaired on the left. RAP (rapid alternating patting) is moderately to severely impaired on the right and moderately impaired on the left. Foot taps are severely impaired on the right and moderately impaired on the left. Foot agility (in the form of heel stomping) is severely impaired on the right and moderately impaired on the left.    Cerebellar testing shows no dysmetria or intention tremor on finger to nose testing. There is no truncal or gait ataxia.   Sensory exam is intact to light touch in the upper and lower extremities.   Gait, station and balance: not testable safely, as she did not bring her 2 wheeled walker. She does stand briefly, bends over, due to pain.   Assessment and Plan:   In summary, Carol Mckee is a very pleasant 75 year old female with a history of advanced R sided predominant  Parkinson's disease, complicated by memory loss, gait disorder, and chronic degenerative back d/s, with need for chronic narcotic pain medication and OSA on CPAP. She was hospitalized several times this year, mostly for urological issues, including for UTI and AMS, pyelonephritis and kidney stone, now s/p lithotripsy. Is on low dose oxybutynin. She is on Lasix for swelling of her legs. She is compliant with CPAP. We increased her pressure 15 cm. The increased in Sinemet to  1-1/2 pills 6 times a day, at 3 hourly intervals starting at 5 AM. She is advised to continue with this, did not need a refill. She was able to reduce hydrocodone by half in the daytime, memory good and stable, no sign of dementia. I will see her back in 4 months, sooner if needed.  I answered all their questions and the patient and her husband were in agreement.  I spent 25 minutes in total face-to-face time with the patient, more than 50% of which was spent in counseling and coordination of care, reviewing test results, reviewing medication and discussing or reviewing the diagnosis of PD and OSA, the prognosis and treatment options

## 2016-12-18 ENCOUNTER — Other Ambulatory Visit: Payer: Self-pay | Admitting: Neurology

## 2016-12-18 DIAGNOSIS — G4733 Obstructive sleep apnea (adult) (pediatric): Secondary | ICD-10-CM

## 2016-12-18 DIAGNOSIS — M545 Low back pain, unspecified: Secondary | ICD-10-CM

## 2016-12-18 DIAGNOSIS — R351 Nocturia: Secondary | ICD-10-CM

## 2016-12-18 DIAGNOSIS — M7989 Other specified soft tissue disorders: Secondary | ICD-10-CM

## 2016-12-18 DIAGNOSIS — Z9989 Dependence on other enabling machines and devices: Secondary | ICD-10-CM

## 2016-12-18 DIAGNOSIS — R413 Other amnesia: Secondary | ICD-10-CM

## 2016-12-18 DIAGNOSIS — G20 Parkinson's disease: Secondary | ICD-10-CM

## 2017-02-11 ENCOUNTER — Telehealth: Payer: Self-pay | Admitting: Neurology

## 2017-02-11 DIAGNOSIS — R413 Other amnesia: Secondary | ICD-10-CM

## 2017-02-11 DIAGNOSIS — G20 Parkinson's disease: Secondary | ICD-10-CM

## 2017-02-11 DIAGNOSIS — R351 Nocturia: Secondary | ICD-10-CM

## 2017-02-11 DIAGNOSIS — G4733 Obstructive sleep apnea (adult) (pediatric): Secondary | ICD-10-CM

## 2017-02-11 DIAGNOSIS — Z9989 Dependence on other enabling machines and devices: Secondary | ICD-10-CM

## 2017-02-11 DIAGNOSIS — M7989 Other specified soft tissue disorders: Secondary | ICD-10-CM

## 2017-02-11 MED ORDER — CARBIDOPA-LEVODOPA 25-100 MG PO TABS: ORAL_TABLET | ORAL | 0 refills | Status: DC

## 2017-02-11 NOTE — Telephone Encounter (Signed)
I spoke to CanaanFrank. He states that the Sinemet seems to be wearing off too soon. Confirmed that she is taking 1.5 tab 6 times a day at 5am, 8, 11, 2pm, 5, and 8. What do you suggest?

## 2017-02-11 NOTE — Telephone Encounter (Signed)
I spoke to Carol Mckee and he is aware of changes and was able to repeat back new orders.

## 2017-02-11 NOTE — Telephone Encounter (Signed)
Would suggest cautious increase: 2 pills at 5 AM, 1.5 pills at 8 AM, 2 at 11 AM, 1.5 pills at 2 PM, 2 at 5 PM and 1.5 pills at 8 PM.  Essentially, 2 pills alternating with 1.5 pills. Please relay back to pt or better to talk to husband.

## 2017-02-11 NOTE — Telephone Encounter (Signed)
Pt's husband called says carbidopa-levodopa (SINEMET IR) 25-100 MG tablet is not working as well. She is taking it as directed but just before a dose is due she begins to shake.  Pt got on the phone and said the tremors are all over, has a problem talking having an episode now. Says she's had 2 episode with bad tremors. One episode woke her at 4am this morning lasting at least an hour. Please call asap.

## 2017-02-11 NOTE — Addendum Note (Signed)
Addended by: Crisoforo OxfordURNER, Keala Drum S on: 02/11/2017 11:52 AM   Modules accepted: Orders

## 2017-02-11 NOTE — Telephone Encounter (Deleted)
error 

## 2017-02-28 ENCOUNTER — Encounter: Payer: Self-pay | Admitting: Neurology

## 2017-03-02 ENCOUNTER — Encounter: Payer: Self-pay | Admitting: Neurology

## 2017-03-02 ENCOUNTER — Ambulatory Visit (INDEPENDENT_AMBULATORY_CARE_PROVIDER_SITE_OTHER): Payer: Medicare Other | Admitting: Neurology

## 2017-03-02 ENCOUNTER — Telehealth: Payer: Self-pay

## 2017-03-02 VITALS — BP 132/76 | HR 78

## 2017-03-02 DIAGNOSIS — R351 Nocturia: Secondary | ICD-10-CM | POA: Diagnosis not present

## 2017-03-02 DIAGNOSIS — R413 Other amnesia: Secondary | ICD-10-CM

## 2017-03-02 DIAGNOSIS — Z9989 Dependence on other enabling machines and devices: Secondary | ICD-10-CM | POA: Diagnosis not present

## 2017-03-02 DIAGNOSIS — M7989 Other specified soft tissue disorders: Secondary | ICD-10-CM

## 2017-03-02 DIAGNOSIS — G20 Parkinson's disease: Secondary | ICD-10-CM

## 2017-03-02 DIAGNOSIS — G4733 Obstructive sleep apnea (adult) (pediatric): Secondary | ICD-10-CM | POA: Diagnosis not present

## 2017-03-02 MED ORDER — CARBIDOPA-LEVODOPA ER 50-200 MG PO TBCR: 1.0000 | EXTENDED_RELEASE_TABLET | Freq: Every day | ORAL | 3 refills | Status: DC

## 2017-03-02 MED ORDER — CARBIDOPA-LEVODOPA 25-100 MG PO TABS: 2.0000 | ORAL_TABLET | Freq: Every day | ORAL | 5 refills | Status: DC

## 2017-03-02 NOTE — Telephone Encounter (Signed)
Patient came by sleep lab for mask fittig. She has high leak with her full face mask and cannot wear it. I tried all full face mask we had and all of them leaked on cpap of 12. I attempted a nasal mask and she immediately could not tolerate. The best mask was an F&P simplus mask small. I had to reduce pressure to 8 for it to seal. This is the best I could do with patient. She has lost 30lbs since her sleep study. Could we try auto 4-10 and get download or cpap of 8 and download. I cant think of nothing else to help her.I sent her home the mask and told her top wait til she hears from DME company that it was changed. Carol MossesDiana can you let Dme company know to call her when change is made.

## 2017-03-02 NOTE — Progress Notes (Signed)
Subjective:    Patient ID: Carol Mckee is a 76 y.o. female.  HPI     Interim history:   Carol Mckee is a very pleasant 76 year old ambidextrous female with an underlying medical history of lumbar spinal stenosis, hip pain, status post hiatal hernia surgery, kidney stone surgery, bladder surgery, hysterectomy, breast biopsy, tonsillectomy and adenoidectomy, who presents for followup consultation of her advanced, right-sided predominant Parkinson's disease, complicated by memory loss, LBP on chronic pain medication, bladder incontinence and gait disorder, sleep d/o and overall deconditioning. She is accompanied by her husband today. I last saw her on 11/02/2016, at which time she reported feeling stable. Unfortunately, she had complications with UTI and urosepsis which led to hospitalization in July 2017. She also had admission in September 2017 for kidney stones and lithotripsy. She had lost weight. She was still using her CPAP regularly but was more bothered by it during the night. She was taking hydrocodone 4 times a day. I suggested we maintain Sinemet at 1-1/2 pills 6 times a day. Her memory scores were good, stable.  Today, 03/02/2017 (all dictated new, as well as above notes, some dictation done in note pad or Word, outside of chart, may appear as copied):  I reviewed her CPAP compliance data from the past 90 days, during which time she used her CPAP 51 days and percent used days greater than 4 hours was 14% only. She has not been using her CPAP in the past month or 2. She complains that the pressure feels too high. She is also having issues tolerating the mask. Her leak is on the high side with the 95th percentile at 32.9 L/m, pressure at 15 cm with EPR of 3, residual AHI 6.7, slightly suboptimal. She reports difficulty using her CPAP. The pressure seems to high. Mask is not fitting well. She has tried multiple different mask options with her DME company. Her husband called in the interim in early  March 2018 reporting that her Sinemet was wearing off quicker. I suggested a cautious increase to her Sinemet by alternating 2 pills with 1-1/2 pills, for a total of 6 doses per day, first dose at 5 AM, last at 8 PM. She reports intermittent left leg spasms, she has these typically about 30 minutes before she is due for her next Sinemet dose. She denies restless leg symptoms. She has been trying to reduce her hydrocodone. She has had no recent falls. Memory and mood are stable. the increase in Sinemet has been helpful but she does feel that there is too long a gap between her last Sinemet dose and the next morning dose.  She has noticed in the past 2 weeks that her leg swelling has become worse, it is much more pronounced on the right. In the recent past with in the last year she reports that she had workup for leg swelling including seeing a kidney specialist and cardiologist. She does endorse having had an ultrasound her legs in the past.  The patient's allergies, current medications, family history, past medical history, past social history, past surgical history and problem list were reviewed and updated as appropriate.   Previously (copied from previous notes for reference):   I saw her on 06/28/2016, at which time she reported ongoing low back pain, was taking hydrocodone 1 pill in the morning and in the evening and half a pill 4 times a day in between, was followed by a new pain management doctor. She had a hospitalization in the interim secondary to  altered mental status and was found to have a urinary tract infection. She was admitted to the hospital on 05/26/2016 and discharged on 06/02/2016, was found to have urosepsis, treated with anti-biotics IV. She had a head CT without contrast upon admission on 05/26/2016 and I reviewed the findings: IMPRESSION: Image quality degraded by motion  Left frontal hypodensities could represent acute or chronic ischemia. Correlate with symptoms and MRI.  Progressive chronic ischemic changes in the left basal ganglia. Patient and her family declined an MRI brain as I understand. Finished HH therapy.   I saw her on 02/25/2016, at which time she was compliant with CPAP therapy. She reported pain while turning in bed, nocturia, no recent UTI. She sees a Dealer in [place]. Memory was stable. She had no recent fall. She needed new CPAP supplies. Her MMSE was 29/30, CDT: 4/4, AFT: 10/min at the time. I suggested we try to increase Sinemet to 1-1/2 pills 6 times a day, every 3 hours, starting at 5 AM.   I reviewed her CPAP compliance data from 05/25/2016 through 06/23/2016 which is a total of 30 days during which time she used her machine 23 days with percent used days greater than 4 hours at 63%, indicating suboptimal compliance because of a gap of one week in June. Average usage of 5 hours and 8 minutes, residual AHI borderline at 6.7, leak at times high with the 95th percentile at 29.2 L/m at a pressure of 15 cm with EPR of 3.   I saw her on 08/28/2015, at which time she reported ongoing issues with low back pain. She also had right shoulder pain. She had severe limitation in her range of motion. She was using a 2 wheeled walker. She was sleeping and in hospital bed. In the early morning she would shift into a lift chair. She would have to get up multiple times at night for bathroom breaks, up to 6 times per night, her husband was having more health issues himself of the past 1 year. She was trying to be compliant with CPAP therapy but AHI and leak were both high. I suggested she continue with Sinemet at the current dose, 1-1/2 pills 5 times a day.   I reviewed her CPAP compliance data from 01/25/2016 through 02/23/2016 which is a total of 30 days during which time she used her machine every night with 100% compliance, average usage of 9 hours and 43 minutes, residual AHI 26.7 per hour, leaked high at 49.8 L/m for the 95th percentile on a pressure of 14 cm  with EPR of 3. Residual apneas appeared to be obstructive in nature.   I saw her on 02/25/2015, at which time she reported that she was continuing to struggle with the CPAP mask. She had trouble closing her mouth. She was using a fullface mask with a chinstrap. Unfortunately, she had fallen from her recliner in December. She had been in her lift chair and fell asleep and accidentally pushed the control button. She fell forward onto her face. She did not have any LOC or residual headaches. She was on 7 1/2 pills of Sinemet daily. She was having ongoing issues with low back pain. In the past she had injection treatment with variable results, under Dr. Francesco Runner for this. She was on hydrocodone 3 times a day. She had nocturia multiple times a night which was very bothersome to her. She felt her memory and mood were stable.    I reviewed her CPAP compliance data from 07/27/2015  through 08/25/2015 which is a total of 30 days during which time she used her machine every night, with percent used days greater than 4 hours at 100%, indicating superb compliance with an average usage of 10 hours and 27 minutes, residual AHI high at 27.1 per hour, leak I with the 95th percentile at 54.5 L/m on a pressure of 14 cm with EPR of 3. Looking at her AHI breakdown, it looks like her residual sleep disordered breathing is primarily obstructive in nature. She was supposed to switch to AutoPap therapy but appears to be still on the same setting of 14 cm.   I saw her on 08/26/2014, at which time she had some residual snoring according to her husband. She was using a fullface mask. Parkinson's-wise, she felt stable. I changed her from AutoPap to a set pressure of 14 cm.    I reviewed her CPAP compliance data from 01/25/2015 through 02/23/2015 which is a total of 30 days during which time she used her machine every night with percent used days greater than 4 hours at 100%, indicating superb compliance on her behalf with an average  usage of 9 hours and 34 minutes and pressure has now been changed to 14 cm with EPR of 3 but AHI was suboptimal at 14.4, probably due to very high leak with the 95th percentile of leak at 78.6 L/m.     I saw her on 05/02/2014, at which time I talked her about her recent sleep study results and her CPAP treatment. She had done well with that. I changed her from AutoPap to CPAP of 14 cm. She changed from a nasal mask to a full face mask. I asked her to not drive anymore. I asked her to continue with her Parkinson's medications. I changed the timing to 6, 10, 2, 6 PM and 10 PM and I encouraged her to continue using CPAP regularly.   I reviewed her compliance data from 07/24/2014 through 08/22/2014 which is a total of 30 days during which time she was 100% compliant. Her residual AHI was unfortunately high at 23.5. 95th percentile pressure was 12.8 but leak was very high at times. She is supposed to be on CPAP but is still on AutoPap from 8-13 with EPR of 3.   I saw her on 02/21/2014, at which time we talked about her signs and symptoms concerning for sleep apnea and I suggested a sleep study. I also advised her to try to reduce sedating medications including her Valium as well as her hydrocodone. She was advised to use her walker at all times. She felt that physical therapy was helpful. She was asking if she could drive. She had been to the [facility] office, and was discouraged from driving. She sees Dr. Francesco Runner in [place], [place] for pain. She had been taking norco 1 pill tid and 1/2 pill bid, alternating. She is taking C/L 1 1/2 pills 5 times a day.   She has sleep study on 02/27/2014 and went over her test results with her in detail today. Her baseline sleep efficiency was 100% with 100% of stage II sleep only. She had no significant EKG changes or periodic leg movements of sleep. Her total AHI was high at 54.5 per hour. Baseline oxygen saturation was 94%, nadir was 86%. She was titrated on CPAP from 5-8 centimeters of  water pressure. She was noted to have central apneas. She was noted to have mouth opening. On a pressure of 8 cm a residual AHI was  6.5 per hour. She had no REM sleep. I placed the patient on CPAP of 9 cm. 03/20/2014 to 04/08/2014, which is a total of 21 days, during which time the patient used CPAP every day. The average usage for all days was 10 hours and 10 minutes. The percent used days greater than 4 hours was 100 %, indicating superb compliance. The residual AHI was unfortunately high at 22.4 per hour, indicating an inadequate treatment pressure of 9 cwp with EPR of 3. Her residual AHI is primarily obstructive in nature per AHI breakdown. Air leak from the mask was also elevated with 38.1 L per minute at the 95th percentile. Earlier this month based on these results I placed her on AutoPap as opposed to CPAP.    I reviewed the patient's PAP compliance data from 04/16/2014 to 05/01/2014, which is a total of 16 days, during which time the patient used CPAP every day. The average usage for all days was 10 hours and 12 minutes. The percent used days greater than 4 hours was 100 %, indicating superb compliance. The residual AHI was unfortunately elevated at 14.9 per hour. The pressure setting is from 8-13 cm. 95th percentile pressure was 12.9. Her leak was at times high.   I saw her on 10/04/2013, at which time I did not make changes to her medications but made a referral to physical therapy and she wanted to stay locally in [place], [place], for this. We talked about potentially starting her on Exelon for memory loss.   I first met her on 05/01/2013, which time I suggested that she increase her Sinemet to 1-1/2 pills 5 times a day. I also reinforced the importance of using her walker at all times as she was at fall risk. She called back in August 2014 and reported an improvement in her symptoms.   She previously followed with Dr. Morene Antu and was last seen by him on 01/01/2013, at which time he felt that she would  benefit from physical therapy due to her lumbar spine stenosis with chronic lower back pain. He also increased her carbidopa-levodopa to 1-1/2 pills at 5 AM, one pill at 9 AM, one pill at 1 PM, 1 1/2 pills at 5 PM and one pill at 9 PM. Her falls assessment tool score at the time was 18.   She has an approximately 12-year history of Parkinson's disease treated with carbidopa-levodopa 25-100 mg strength 5 times daily. She developed lower back pain and was treated with hydrocodone, oxycodone and fentanyl patch. She also received epidural steroid injections. She had an EEG in May 2011, which did not show any epileptiform discharges. MRI L-spine in March 2013 showed scoliosis, and severe degenerative joint disease. She has had jerking movements in her legs. This does not occur while lying down but mostly with sitting or standing. She has developed bladder incontinence. In January 2014 her MMSE was 22, clock drawing was 4, animal fluency was 8.   She has a hospital bed with rails up at night. He helps her to use a bed pan. She has to urinate 2-5 times per night. She can walk about 15 feet at a time with 2 wheeled walker. She denies VH or AH and has not had dyskinesias. She lives close to [place] and had PT there. She had epidural injection at [facility] in the past, and at [facility] with good relief.    Her Past Medical History Is Significant For: Past Medical History:  Diagnosis Date  . Abnormality of  gait   . Chronic kidney disease   . Edema   . Fatigue   . Other chronic pulmonary heart diseases   . Paralysis agitans ([place])   . Parkinson disease ([place])   . Parkinson's disease ([place]) 10/04/2013  . Pyelonephritis 06/2016   pt states hospitalized for kidney infection/sepsis  . Sleep apnea    pt does use cpap  . Spinal stenosis   . Stiffness of joint, not elsewhere classified,  shoulder region     Her Past Surgical History Is Significant For: Past Surgical History:  Procedure Laterality Date  . ABDOMINAL  HYSTERECTOMY    . COLONOSCOPY     x3  . CYSTOSCOPY WITH STENT PLACEMENT Left 07/03/2016   Procedure: CYSTOSCOPY WITH STENT PLACEMENT;  Surgeon: Raynelle Bring, MD;  Location: [facility];  Service: Urology;  Laterality: Left;    Her Family History Is Significant For: No family history on file.  Her Social History Is Significant For: Social History   Social History  . Marital status: Married    Spouse name: Pilar Plate  . Number of children: 3  . Years of education: 14   Occupational History  . RETIRED    Social History Main Topics  . Smoking status: Never Smoker  . Smokeless tobacco: Never Used  . Alcohol use No  . Drug use: No  . Sexual activity: Not Asked   Other Topics Concern  . None   Social History Narrative   Patient is right handed.resides in a home with husband    Her Allergies Are:  Allergies  Allergen Reactions  . Azithromycin Diarrhea  . Lyrica [Pregabalin]     Drops blood pressure  . Tizanidine Other (See Comments)    hypotension  . Erythromycin Diarrhea  :   Her Current Medications Are:  Outpatient Encounter Prescriptions as of 03/02/2017  Medication Sig  . baclofen (LIORESAL) 10 MG tablet Take 10 mg by mouth daily.   . carbidopa-levodopa (SINEMET IR) 25-100 MG tablet Take 2 tab at 5am, 1.5 tab at 8am, 2 tab at 11am, 1.5tab at 2pm, 2 tab at 5pm, and 1.5 tab at 8pm.  . Cholecalciferol (VITAMIN D3) 5000 UNITS CAPS Take 1 capsule by mouth daily.  . DULoxetine (CYMBALTA) 30 MG capsule Take 30 mg by mouth daily.   . furosemide (LASIX) 20 MG tablet Take 20 mg by mouth daily as needed.  Marland Kitchen HYDROcodone-acetaminophen (NORCO/VICODIN) 5-325 MG per tablet Take 0.5-1 tablets by mouth as needed. TAKES ONE TABLET IN THE MORNING AND TAKES ONE-HALF TABLET 5 TIMES DAILY  . KRILL OIL PO Take 1 capsule by mouth daily.  . magnesium oxide (MAG-OX) 400 MG tablet Take 400 mg by mouth daily.  . polyethylene glycol (MIRALAX / GLYCOLAX) packet Take 17 g by mouth daily.  . vitamin E 400  UNIT capsule Take 400 Units by mouth daily.  . VOLTAREN 1 % GEL Apply 1 application topically daily as needed (for pain).   . [DISCONTINUED] ciprofloxacin (CIPRO) 500 MG tablet Take 1 tablet (500 mg total) by mouth 2 (two) times daily.   No facility-administered encounter medications on file as of 03/02/2017.   :  Review of Systems:  Out of a complete 14 point review of systems, all are reviewed and negative with the exception of these symptoms as listed below: Review of Systems  Cardiovascular:       Swelling in feet   Genitourinary:       Incontinence   Neurological:  Patient feels that the CPAP pressure may be too much.   Patient states that she is having trouble sleeping at night.   C/O leg spasms.     Objective:  Neurologic Exam  Physical Exam Physical Examination:   Vitals:   03/02/17 1300  BP: 132/76  Pulse: 78    General Examination: The patient is a very pleasant 76 y.o. female in no acute distress. She appears well-developed and well-nourished and well groomed. She is situated in her wheelchair.  HEENT: Normocephalic, atraumatic, pupils are equal, round and reactive to light and accommodation. She wears corrective eyeglasses. She has difficulty tracking, no nystagmus is noted. She has a moderate decrease in eye blink rate. Hearing is impaired. Face is symmetric with moderate facial masking. Airway examination is unchanged. She has no significant sialorrhea today. Speech is moderately hypophonic. She has very minimal dysarthria.   Chest: Clear to auscultation without wheezing, rhonchi or crackles noted.  Heart: S1+S2+0, regular and normal without murmurs, rubs or gallops noted.   Abdomen: Soft, non-tender and non-distended with normal bowel sounds appreciated on auscultation.  Extremities: There is 2+ pitting edema in the distal lower extremities bilaterally. Right side is much more pronounced than left, she has significant edema of her foot on the right, she  has mild redness of the right shin area which she reports is stable, she has mild petechial hemorrhages in the left foot.   Skin: Warm and dry without trophic changes noted. Changes to legs as above.  Musculoskeletal: exam reveals no obvious joint deformities, tenderness or joint swelling or erythema, with the exception of unchanged severe decrease and active range of motion in her right shoulder, she does have better mobility passively, no significant crepitation.   Neurologically:  Mental status: The patient is awake, alert and oriented in all 4 spheres. Her immediate and remote memory, attention, language skills and fund of knowledge are mildly impaired. There is no evidence of aphasia, agnosia, apraxia or anomia. Thought process is linear. Mood is normal and affect is normal.  On 08/26/2014: MMSE 25/30, CDT 2/4, AFT: 9.   On 02/25/2015: MMSE: 27/30, CDT: 3/4, AFT: 8/min, GDS: 2/15.  On 08/28/2015: MMSE: 26/30, CDT: 3/4, AFT: 5/min.   On 02/25/2016: MMSE: 29/30, CDT: 4/4, AFT: 10/min.  On 11/02/2016: MMSE: 29/30, CDT: 4/4, AFT: 13/min.  Cranial nerves II - XII are as described above under HEENT exam. In addition: shoulder shrug is normal with unequal, right shoulder is lower.. Motor exam: Normal bulk, strength globally of 4 out of 5, significant limitation in range of motion of the right shoulder excluded. She has no significant dyskinesias. Tone is increased throughout, right more than left. She has moderate bradykinesia. She has no significant resting tremor today. Romberg is not testable safely, reflexes are 1+ in the upper extremities and trace in both knees, absent in the ankles but she has significant swelling in her legs. Fine motor skills are moderate to severely impaired, right side more pronounced than left. I did not feel it was safe for her to stand or walk for me, she has no walking aid with her today. Sensory exam: intact to light touch.   Assessment and plan:  In summary,  Thomasine Klutts is a very pleasant 76 y.o.-year old female with an underlying medical history of lumbar spinal stenosis, hip pain, status post hiatal hernia surgery, kidney stone surgery, bladder surgery, hysterectomy, breast biopsy, tonsillectomy and adenoidectomy, recurrent UTI, right shoulder injury, sleep apnea, who presents for follow-up consultation of  her advanced right-sided predominant Parkinson's disease, complicated by mild memory loss, chronic pain, bladder dysfunction, deconditioning, lower extremity swelling. She has had more trouble using her CPAP. She feels that the pressure is too high in the mask is uncomfortable. I would like to reduce the CPAP pressure to 12 cm at this time. She has previously been very compliant with CPAP therapy. We increase her Sinemet in the interim to 2 pills alternating with 1-1/2 pills which has been helpful. She starts her first dose at 5 AM and has her sixth dose at 8 PM. She has had intermittent muscle spasms which tend to occur about half an hour before she is due for her next Sinemet dose. She denies any restless legs type symptoms. She has had muscle spasms and cramps before but this feels different. I suggested we add Sinemet CR to bedtime which is around 10:30 PM. She is advised to continue with the current alternating schedule of Sinemet IR but in a couple of weeks we can try to increase her regular Sinemet to 2 pills 6 times a day. Her husband is reminded to give [place] an update via phone call in a couple weeks. Her memory scores have been fairly stable, she has mild memory loss and we will continue to monitor. Her lower extremity swelling has become worse clearly and she reports significant change in the past couple weeks. She has previously been checked out for lower extremity swelling but this clearly is worse. She is advised to see her primary care physician as soon as possible for this. She has ongoing issues with low back pain and right shoulder dysfunction. She  reports having had a shoulder MRI previously for this. I did not see an MRI of the shoulder in our system. She is advised to also stop by the sleep lab today on her way out to see if we can help her with her CPAP mask issues. I adjusted her Sinemet prescription as well as entered a new prescription for Sinemet CR. I provided written instructions. I would like to see her back in 4 months, sooner as needed. I answered all their questions today and the patient and her husband were in agreement.  I spent 40 minutes in total face-to-face time with the patient, more than 50% of which was spent in counseling and coordination of care, reviewing test results, reviewing medication and discussing or reviewing the diagnosis of PD, OSA, memory loss, swelling of legs, the prognosis and treatment options. Pertinent laboratory and imaging test results that were available during this visit with the patient were reviewed by me and considered in my medical decision making (see chart for details).

## 2017-03-02 NOTE — Patient Instructions (Addendum)
We will add Sinemet CR at bedtime, which is a long acting formulation of levodopa.  I addition, after about 2 weeks, you can try to increase your regular Sinemet to 2 pills 6 times a day. Call in about 2 weeks for an update.  Please have Dr. Clarene DukeLittle see you for the acute leg swelling.  We will reduce your cpap pressure to 12 cm.  We will have you meet with Robin.

## 2017-03-03 ENCOUNTER — Ambulatory Visit (HOSPITAL_COMMUNITY)
Admission: RE | Admit: 2017-03-03 | Discharge: 2017-03-03 | Disposition: A | Discharge status: Home / Self Care | Payer: Medicare Other | Source: Ambulatory Visit | Attending: Family Medicine | Admitting: Family Medicine

## 2017-03-03 ENCOUNTER — Other Ambulatory Visit (HOSPITAL_COMMUNITY): Payer: Self-pay | Admitting: Family Medicine

## 2017-03-03 DIAGNOSIS — R600 Localized edema: Secondary | ICD-10-CM

## 2017-03-03 NOTE — Telephone Encounter (Signed)
I sent [facility] a message to have them hold the order from yesterday until we hear further on what to do.

## 2017-03-07 NOTE — Telephone Encounter (Signed)
Did you hear any further on this?

## 2017-03-07 NOTE — Telephone Encounter (Signed)
I was waiting on Dr. Frances Furbish to write an order. She said she would decrease her to 8.

## 2017-03-10 ENCOUNTER — Telehealth: Payer: Self-pay | Admitting: Neurology

## 2017-03-10 NOTE — Telephone Encounter (Signed)
Patient called office in reference to CPAP machine.  Patient states [facility] has no orders.    Nerve in left leg has been jumping (muscle spasm) and bottom left foot is numb making it difficult to walk

## 2017-03-10 NOTE — Telephone Encounter (Signed)
I called pt. I advised her that Dr. Frances Furbish is out of the office this week but I will speak to Dr. Frances Furbish on Monday regarding the pressure change for her cpap.  Pt also reports that her left leg has been having having muscle spasms and the bottom of her left foot seems to be numb, making it hard to walk. Pt is unsure when the numbness started but she used to be able to "walk it off" but now it seems that the numbness on the bottom of her foot stays. I advised her that I will inform the [facility] of this concern and see if this doctor recommends anything for this until Dr. Frances Furbish returns. Pt verbalized understanding.

## 2017-03-10 NOTE — Telephone Encounter (Signed)
The patient was unable to come to the phone but her husband responded. I explained to him that I would like Dr. Frances Furbish to address these problems on Monday, as the patient already is taking Cymbalta, baclofen, carbidopa levodopa and hydrocodone. She may apply Voltaren gel to the bottom of her feet but I would not call in any systemic medication at the risk of creating some side effect given that she already takes multiple medications that can work on neuropathy. Therefore it would be best to address this on  Monday with Dr. Frances Furbish. Mr. Raburn agreed.

## 2017-03-14 NOTE — Addendum Note (Signed)
Addended by: Huston Foley on: 03/14/2017 10:28 AM   Modules accepted: Orders

## 2017-03-14 NOTE — Telephone Encounter (Signed)
I called pt and advised her that the order for her cpap pressure change has been sent to [facility]. Pt wants to know what to do about the left leg numbness. It appears that Dr. Vickey Huger spoke to this pt last week regarding this concern. Pt saw Dr. Leandrew Koyanagi who ordered a doppler study of her right leg to rule out a clot (pt said that both legs were "breaking out") and this was normal. She was told that the "capillaries" in her lower legs were "bursting and causing age spots". She is concerned still about the intermittent numbness in her left leg if she sits for a long time. She says that she took some fluid pills which decreased the swelling in her legs. Pt says that she has been "running around" to all these doctors trying to figure out what is wrong with her legs and wants Dr. Teofilo Pod recommendations.

## 2017-03-14 NOTE — Telephone Encounter (Signed)
Please fax cpap pressure reduction order to [facility], thx

## 2017-03-14 NOTE — Telephone Encounter (Signed)
The numbness is likely from sitting and compression of nerves. Unfortunately, other than trying to stand and move, there is not much I can suggest. We talked about the importance of moving last time. She is limited in her mobility at this time. Not much we can do I am afraid.

## 2017-03-28 IMAGING — CR DG CHEST 1V PORT
1 series · 1 of 1 positions shown · non-contrast
Comparison: [DATE] .

CLINICAL DATA: Shortness of breath.

EXAM:
PORTABLE CHEST 1 VIEW

[ap portable]
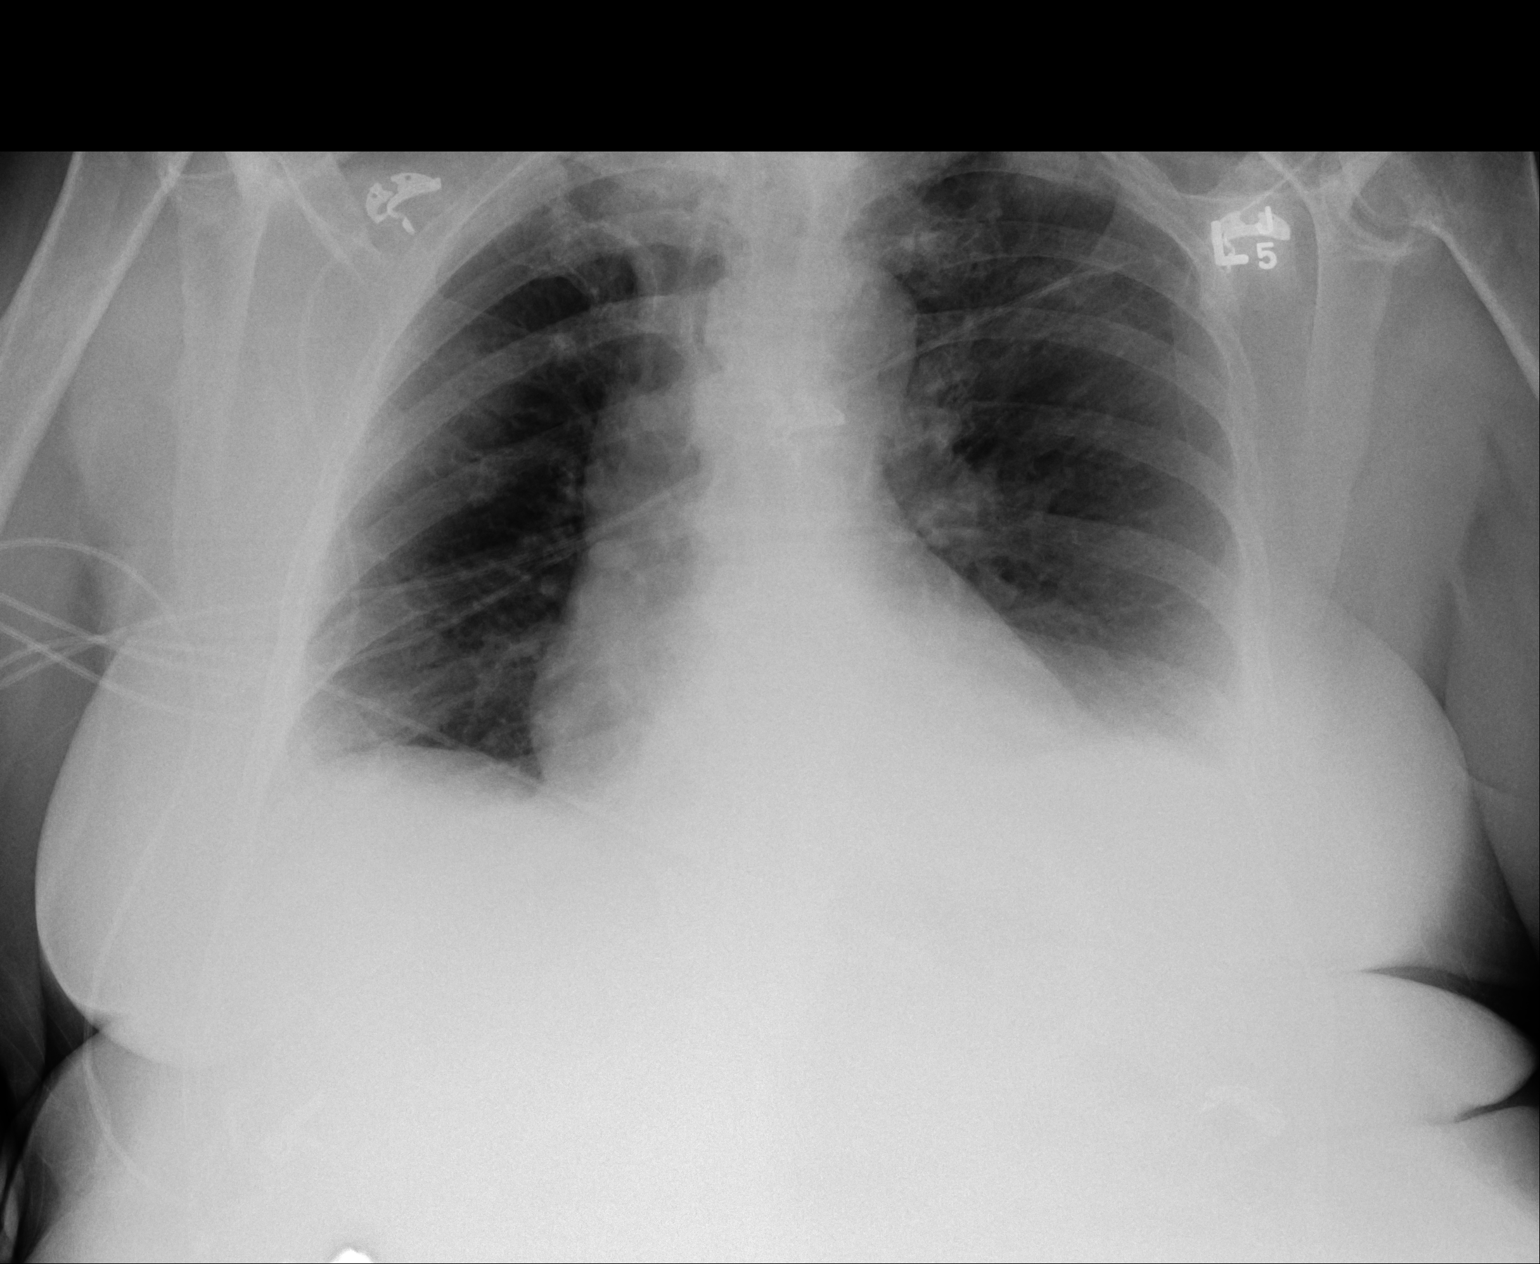

[1 of 1 positions shown; findings below may reference images not displayed]

FINDINGS: Mediastinum and hilar structures are normal. Cardiomegaly. Mild left
lower lobe and left apical infiltrate. Small left-sided pleural
effusion cannot be excluded .
IMPRESSION: Mild left upper lobe and left lower lobe infiltrate. Small left
pleural effusion. Follow-up chest x-rays to demonstrate clearing
suggested.

## 2017-04-20 ENCOUNTER — Telehealth: Payer: Self-pay | Admitting: Neurology

## 2017-04-20 DIAGNOSIS — R351 Nocturia: Secondary | ICD-10-CM

## 2017-04-20 DIAGNOSIS — Z9989 Dependence on other enabling machines and devices: Secondary | ICD-10-CM

## 2017-04-20 DIAGNOSIS — M7989 Other specified soft tissue disorders: Secondary | ICD-10-CM

## 2017-04-20 DIAGNOSIS — G20 Parkinson's disease: Secondary | ICD-10-CM

## 2017-04-20 DIAGNOSIS — G4733 Obstructive sleep apnea (adult) (pediatric): Secondary | ICD-10-CM

## 2017-04-20 DIAGNOSIS — R413 Other amnesia: Secondary | ICD-10-CM

## 2017-04-20 MED ORDER — CARBIDOPA-LEVODOPA 25-100 MG PO TABS: 2.0000 | ORAL_TABLET | Freq: Every day | ORAL | 5 refills | Status: DC

## 2017-04-20 NOTE — Telephone Encounter (Signed)
I spoke to husband and he is aware. He requested that I send a new Rx to [facility]. I am sending now.

## 2017-04-20 NOTE — Telephone Encounter (Signed)
Husband states that it was discussed increasing patient's medication to 2 tabs 6 times a day. Patient is currently alternating with 1.5 and 2 tabs 6 times a day. He feels that she would benefit from the increase, states that at night she has increased Parkinson symptoms. Ok to increase?

## 2017-04-20 NOTE — Telephone Encounter (Signed)
Okay to try Sinemet 2 pills 6 times a day.  Please notify pt or husband. thx

## 2017-04-20 NOTE — Addendum Note (Signed)
Addended by: Crisoforo OxfordURNER, [name] S on: 04/20/2017 05:22 PM   Modules accepted: Orders

## 2017-04-20 NOTE — Telephone Encounter (Signed)
Patients husband is calling in reference to carbidopa-levodopa (SINEMET IR) 25-100 MG tablet would like to know if he is needing to increase patients medication to 2 pills each time?  States patient is having trouble at bed time will take 8pm pill and by 10pm she is needing more.  Please call.

## 2017-07-04 ENCOUNTER — Ambulatory Visit (INDEPENDENT_AMBULATORY_CARE_PROVIDER_SITE_OTHER): Payer: Medicare Other | Admitting: Neurology

## 2017-07-04 ENCOUNTER — Encounter: Payer: Self-pay | Admitting: Neurology

## 2017-07-04 VITALS — BP 160/96 | HR 84

## 2017-07-04 DIAGNOSIS — M545 Low back pain: Secondary | ICD-10-CM

## 2017-07-04 DIAGNOSIS — R413 Other amnesia: Secondary | ICD-10-CM | POA: Diagnosis not present

## 2017-07-04 DIAGNOSIS — Z9989 Dependence on other enabling machines and devices: Secondary | ICD-10-CM

## 2017-07-04 DIAGNOSIS — G8929 Other chronic pain: Secondary | ICD-10-CM | POA: Diagnosis not present

## 2017-07-04 DIAGNOSIS — G4733 Obstructive sleep apnea (adult) (pediatric): Secondary | ICD-10-CM

## 2017-07-04 DIAGNOSIS — G20 Parkinson's disease: Secondary | ICD-10-CM

## 2017-07-04 NOTE — Patient Instructions (Signed)
We will stop the Sinemet CR.  We will continue your Sinemet 2 pills 6 times a day.

## 2017-07-04 NOTE — Progress Notes (Signed)
Subjective:    Patient ID: Carol Mckee is a 76 y.o. female.  HPI     Interim history:   Carol Mckee is a very pleasant 76 year old ambidextrous female with an underlying medical history of lumbar spinal stenosis, hip pain, status post hiatal hernia surgery, kidney stone surgery, bladder surgery, hysterectomy, breast biopsy, tonsillectomy and adenoidectomy, who presents for followup consultation of her advanced, right-sided predominant Parkinson's disease, complicated by memory loss, LBP on chronic pain medication, bladder incontinence and gait disorder, sleep d/o and overall deconditioning secondary to progression and advancing age. She is accompanied by her husband again today. I last saw her on 03/02/2017, at which time she had not been using her CPAP for a month or 2 prior. She felt that the pressure was too high. She had a lot of air leakage as well. Her husband had called in March reporting that her Sinemet was wearing off quicker. I did ask her to increase Sinemet cautiously by alternating 2 pills with 1-1/2 pills for a total of 6 doses per day. Memory and mood were stable. She did have more swelling of the lower extremities. Thankfully, she had no recent falls. I suggested we continue with her medication regimen. With the exception that we add a Sinemet CR at bedtime. I also suggested we reduced her CPAP pressure to 12 cm for better tolerance and that she meet with our sleep lab manager Robin for her mask issues.  Her husband called in the interim in May 2018 wondering if we could increase her Sinemet further, as previously discussed. I suggested we increase her Sinemet to 2 pills 6 times a day.  Today, 07/04/2017: I reviewed her compliance data from 05/31/17 through 06/29/17, which is a total of 30 days, during which time she used her machine 29 days, with percent used days over 4 hours of 22%, indicating poor compliance, with an average of 3 h and 2 min. AHI 12/hour, leak around 12 lpm. She  reports that the CPAP is more tolerable.   Sinemet CR is currently of no help, tried it at bedtime and in the middle of the night. She did take a fall and bruised herself, turned suddenly d/t phone ringing.    The patient's allergies, current medications, family history, past medical history, past social history, past surgical history and problem list were reviewed and updated as appropriate.    Previously (copied from previous notes for reference):   I saw her on 11/02/2016, at which time she reported feeling stable. Unfortunately, she had complications with UTI and urosepsis which led to hospitalization in July 2017. She also had admission in September 2017 for kidney stones and lithotripsy. She had lost weight. She was still using her CPAP regularly but was more bothered by it during the night. She was taking hydrocodone 4 times a day. I suggested we maintain Sinemet at 1-1/2 pills 6 times a day. Her memory scores were good, stable.   I reviewed her CPAP compliance data from the past 90 days, during which time she used her CPAP 51 days and percent used days greater than 4 hours was 14% only.   I saw her on 06/28/2016, at which time she reported ongoing low back pain, was taking hydrocodone 1 pill in the morning and in the evening and half a pill 4 times a day in between, was followed by a new pain management doctor. She had a hospitalization in the interim secondary to altered mental status and was found to have a  urinary tract infection. She was admitted to the hospital on 05/26/2016 and discharged on 06/02/2016, was found to have urosepsis, treated with anti-biotics IV. She had a head CT without contrast upon admission on 05/26/2016 and I reviewed the findings: IMPRESSION: Image quality degraded by motion  Left frontal hypodensities could represent acute or chronic ischemia. Correlate with symptoms and MRI. Progressive chronic ischemic changes in the left basal ganglia. Patient and her family  declined an MRI brain as I understand. Finished HH therapy.   I saw her on 02/25/2016, at which time she was compliant with CPAP therapy. She reported pain while turning in bed, nocturia, no recent UTI. She sees a Dealer in [place]. Memory was stable. She had no recent fall. She needed new CPAP supplies. Her MMSE was 29/30, CDT: 4/4, AFT: 10/min at the time. I suggested we try to increase Sinemet to 1-1/2 pills 6 times a day, every 3 hours, starting at 5 AM.   I reviewed her CPAP compliance data from 05/25/2016 through 06/23/2016 which is a total of 30 days during which time she used her machine 23 days with percent used days greater than 4 hours at 63%, indicating suboptimal compliance because of a gap of one week in June. Average usage of 5 hours and 8 minutes, residual AHI borderline at 6.7, leak at times high with the 95th percentile at 29.2 L/m at a pressure of 15 cm with EPR of 3.   I saw her on 08/28/2015, at which time she reported ongoing issues with low back pain. She also had right shoulder pain. She had severe limitation in her range of motion. She was using a 2 wheeled walker. She was sleeping and in hospital bed. In the early morning she would shift into a lift chair. She would have to get up multiple times at night for bathroom breaks, up to 6 times per night, her husband was having more health issues himself of the past 1 year. She was trying to be compliant with CPAP therapy but AHI and leak were both high. I suggested she continue with Sinemet at the current dose, 1-1/2 pills 5 times a day.   I reviewed her CPAP compliance data from 01/25/2016 through 02/23/2016 which is a total of 30 days during which time she used her machine every night with 100% compliance, average usage of 9 hours and 43 minutes, residual AHI 26.7 per hour, leaked high at 49.8 L/m for the 95th percentile on a pressure of 14 cm with EPR of 3. Residual apneas appeared to be obstructive in nature.   I saw her on  02/25/2015, at which time she reported that she was continuing to struggle with the CPAP mask. She had trouble closing her mouth. She was using a fullface mask with a chinstrap. Unfortunately, she had fallen from her recliner in December. She had been in her lift chair and fell asleep and accidentally pushed the control button. She fell forward onto her face. She did not have any LOC or residual headaches. She was on 7 1/2 pills of Sinemet daily. She was having ongoing issues with low back pain. In the past she had injection treatment with variable results, under Dr. Francesco Runner for this. She was on hydrocodone 3 times a day. She had nocturia multiple times a night which was very bothersome to her. She felt her memory and mood were stable.    I reviewed her CPAP compliance data from 07/27/2015 through 08/25/2015 which is a total of 30 days  during which time she used her machine every night, with percent used days greater than 4 hours at 100%, indicating superb compliance with an average usage of 10 hours and 27 minutes, residual AHI high at 27.1 per hour, leak I with the 95th percentile at 54.5 L/m on a pressure of 14 cm with EPR of 3. Looking at her AHI breakdown, it looks like her residual sleep disordered breathing is primarily obstructive in nature. She was supposed to switch to AutoPap therapy but appears to be still on the same setting of 14 cm.   I saw her on 08/26/2014, at which time she had some residual snoring according to her husband. She was using a fullface mask. Parkinson's-wise, she felt stable. I changed her from AutoPap to a set pressure of 14 cm.    I reviewed her CPAP compliance data from 01/25/2015 through 02/23/2015 which is a total of 30 days during which time she used her machine every night with percent used days greater than 4 hours at 100%, indicating superb compliance on her behalf with an average usage of 9 hours and 34 minutes and pressure has now been changed to 14 cm with EPR of 3  but AHI was suboptimal at 14.4, probably due to very high leak with the 95th percentile of leak at 78.6 L/m.     I saw her on 05/02/2014, at which time I talked her about her recent sleep study results and her CPAP treatment. She had done well with that. I changed her from AutoPap to CPAP of 14 cm. She changed from a nasal mask to a full face mask. I asked her to not drive anymore. I asked her to continue with her Parkinson's medications. I changed the timing to 6, 10, 2, 6 PM and 10 PM and I encouraged her to continue using CPAP regularly.   I reviewed her compliance data from 07/24/2014 through 08/22/2014 which is a total of 30 days during which time she was 100% compliant. Her residual AHI was unfortunately high at 23.5. 95th percentile pressure was 12.8 but leak was very high at times. She is supposed to be on CPAP but is still on AutoPap from 8-13 with EPR of 3.   I saw her on 02/21/2014, at which time we talked about her signs and symptoms concerning for sleep apnea and I suggested a sleep study. I also advised her to try to reduce sedating medications including her Valium as well as her hydrocodone. She was advised to use her walker at all times. She felt that physical therapy was helpful. She was asking if she could drive. She had been to the [facility] office, and was discouraged from driving. She sees Dr. Francesco Runner in [place], [place] for pain. She had been taking norco 1 pill tid and 1/2 pill bid, alternating. She is taking C/L 1 1/2 pills 5 times a day.   She has sleep study on 02/27/2014 and went over her test results with her in detail today. Her baseline sleep efficiency was 100% with 100% of stage II sleep only. She had no significant EKG changes or periodic leg movements of sleep. Her total AHI was high at 54.5 per hour. Baseline oxygen saturation was 94%, nadir was 86%. She was titrated on CPAP from 5-8 centimeters of water pressure. She was noted to have central apneas. She was noted to have mouth opening.  On a pressure of 8 cm a residual AHI was 6.5 per hour. She had no REM sleep. I  placed the patient on CPAP of 9 cm. 03/20/2014 to 04/08/2014, which is a total of 21 days, during which time the patient used CPAP every day. The average usage for all days was 10 hours and 10 minutes. The percent used days greater than 4 hours was 100 %, indicating superb compliance. The residual AHI was unfortunately high at 22.4 per hour, indicating an inadequate treatment pressure of 9 cwp with EPR of 3. Her residual AHI is primarily obstructive in nature per AHI breakdown. Air leak from the mask was also elevated with 38.1 L per minute at the 95th percentile. Earlier this month based on these results I placed her on AutoPap as opposed to CPAP.    I reviewed the patient's PAP compliance data from 04/16/2014 to 05/01/2014, which is a total of 16 days, during which time the patient used CPAP every day. The average usage for all days was 10 hours and 12 minutes. The percent used days greater than 4 hours was 100 %, indicating superb compliance. The residual AHI was unfortunately elevated at 14.9 per hour. The pressure setting is from 8-13 cm. 95th percentile pressure was 12.9. Her leak was at times high.   I saw her on 10/04/2013, at which time I did not make changes to her medications but made a referral to physical therapy and she wanted to stay locally in [place], [place], for this. We talked about potentially starting her on Exelon for memory loss.   I first met her on 05/01/2013, which time I suggested that she increase her Sinemet to 1-1/2 pills 5 times a day. I also reinforced the importance of using her walker at all times as she was at fall risk. She called back in August 2014 and reported an improvement in her symptoms.   She previously followed with Dr. Morene Antu and was last seen by him on 01/01/2013, at which time he felt that she would benefit from physical therapy due to her lumbar spine stenosis with chronic lower back  pain. He also increased her carbidopa-levodopa to 1-1/2 pills at 5 AM, one pill at 9 AM, one pill at 1 PM, 1 1/2 pills at 5 PM and one pill at 9 PM. Her falls assessment tool score at the time was 18.   She has an approximately 12-year history of Parkinson's disease treated with carbidopa-levodopa 25-100 mg strength 5 times daily. She developed lower back pain and was treated with hydrocodone, oxycodone and fentanyl patch. She also received epidural steroid injections. She had an EEG in May 2011, which did not show any epileptiform discharges. MRI L-spine in March 2013 showed scoliosis, and severe degenerative joint disease. She has had jerking movements in her legs. This does not occur while lying down but mostly with sitting or standing. She has developed bladder incontinence. In January 2014 her MMSE was 22, clock drawing was 4, animal fluency was 8.   She has a hospital bed with rails up at night. He helps her to use a bed pan. She has to urinate 2-5 times per night. She can walk about 15 feet at a time with 2 wheeled walker. She denies VH or AH and has not had dyskinesias. She lives close to [place] and had PT there. She had epidural injection at [facility] in the past, and at [facility] with good relief.    Her Past Medical History Is Significant For: Past Medical History:  Diagnosis Date  . Abnormality of gait   . Chronic kidney disease   .  Edema   . Fatigue   . Other chronic pulmonary heart diseases   . Paralysis agitans ([place])   . Parkinson disease ([place])   . Parkinson's disease ([place]) 10/04/2013  . Pyelonephritis 06/2016   pt states hospitalized for kidney infection/sepsis  . Sleep apnea    pt does use cpap  . Spinal stenosis   . Stiffness of joint, not elsewhere classified,  shoulder region     Her Past Surgical History Is Significant For: Past Surgical History:  Procedure Laterality Date  . ABDOMINAL HYSTERECTOMY    . COLONOSCOPY     x3  . CYSTOSCOPY WITH STENT PLACEMENT Left 07/03/2016    Procedure: CYSTOSCOPY WITH STENT PLACEMENT;  Surgeon: Raynelle Bring, MD;  Location: [facility];  Service: Urology;  Laterality: Left;    Her Family History Is Significant For: No family history on file.  Her Social History Is Significant For: Social History   Social History  . Marital status: Married    Spouse name: Pilar Plate  . Number of children: 3  . Years of education: 14   Occupational History  . RETIRED    Social History Main Topics  . Smoking status: Never Smoker  . Smokeless tobacco: Never Used  . Alcohol use No  . Drug use: No  . Sexual activity: Not Asked   Other Topics Concern  . None   Social History Narrative   Patient is right handed.resides in a home with husband    Her Allergies Are:  Allergies  Allergen Reactions  . Azithromycin Diarrhea  . Lyrica [Pregabalin]     Drops blood pressure  . Tizanidine Other (See Comments)    hypotension  . Erythromycin Diarrhea  :   Her Current Medications Are:  Outpatient Encounter Prescriptions as of 07/04/2017  Medication Sig  . baclofen (LIORESAL) 10 MG tablet Take 10 mg by mouth daily.   . carbidopa-levodopa (SINEMET CR) 50-200 MG tablet Take 1 tablet by mouth at bedtime.  . carbidopa-levodopa (SINEMET IR) 25-100 MG tablet Take 2 tablets by mouth 6 (six) times daily. 2 pills at 5am, 8am, 11am, 2pm, 5pm, and 8pm.  . Cholecalciferol (VITAMIN D3) 5000 UNITS CAPS Take 1 capsule by mouth daily.  . DULoxetine (CYMBALTA) 30 MG capsule Take 30 mg by mouth daily.   . furosemide (LASIX) 20 MG tablet Take 20 mg by mouth daily as needed.  Marland Kitchen HYDROcodone-acetaminophen (NORCO/VICODIN) 5-325 MG per tablet Take 0.5-1 tablets by mouth as needed. TAKES ONE TABLET IN THE MORNING AND TAKES ONE-HALF TABLET 5 TIMES DAILY  . KRILL OIL PO Take 1 capsule by mouth daily.  . magnesium oxide (MAG-OX) 400 MG tablet Take 400 mg by mouth daily.  . Multiple Vitamin (MULTIVITAMIN) tablet Take 1 tablet by mouth daily.  . polyethylene glycol (MIRALAX /  GLYCOLAX) packet Take 17 g by mouth daily.  . vitamin E 400 UNIT capsule Take 400 Units by mouth daily.  . VOLTAREN 1 % GEL Apply 1 application topically daily as needed (for pain).    No facility-administered encounter medications on file as of 07/04/2017.   :  Review of Systems:  Out of a complete 14 point review of systems, all are reviewed and negative with the exception of these symptoms as listed below:  Review of Systems  Neurological:       Pt presents today to discuss PD, memory, and cpap. Pt says that she is not using her cpap because she goes to the bathroom at night and does not put  it back on. Pt feels that her memory is stable. Pt is having intermittent leg swelling. Pt has lost her sense of taste.    Objective:  Neurological Exam  Physical Exam Physical Examination:   Vitals:   07/04/17 1256  BP: (!) 160/96  Pulse: 84   General Examination: The patient is a very pleasant 76 y.o. female in no acute distress. She appears well-developed and well-nourished and well groomed. Somewhat tearful at times.   HEENT: Normocephalic, atraumatic, pupils are equal, round and reactive to light and accommodation. She wears corrective eyeglasses. She has difficulty tracking, no nystagmus is noted. She has a moderate decrease in eye blink rate. Hearing is impaired. Face is symmetric with moderate facial masking. Airway examination is unchanged. She has no significant sialorrhea today. Speech is moderately hypophonic. She has very minimal dysarthria.   Chest: Clear to auscultation without wheezing, rhonchi or crackles noted.  Heart: S1+S2+0, regular and normal without murmurs, rubs or gallops noted.   Abdomen: Soft, non-tender and non-distended with normal bowel sounds appreciated on auscultation.  Extremities: There is 1-2+ pitting edema in the distal lower extremities bilaterally. Right side more pronounced than left, wearing compression stockings up to knees.   Skin: Warm and  dry without trophic changes noted. Changes to legs as above.  Musculoskeletal: exam reveals no obvious joint deformities, tenderness or joint swelling or erythema, with the exception of unchanged severe decrease and active range of motion in her right shoulder, she does have better mobility passively, no significant crepitation.   Neurologically:  Mental status: The patient is awake, alert and oriented in all 4 spheres. Her immediate and remote memory, attention, language skills and fund of knowledge are mildly impaired. There is no evidence of aphasia, agnosia, apraxia or anomia. Thought process is linear. Mood is normal and affect is normal.   On 08/26/2014: MMSE 25/30, CDT 2/4, AFT: 9.   On 02/25/2015: MMSE: 27/30, CDT: 3/4, AFT: 8/min, GDS: 2/15.  On 08/28/2015: MMSE: 26/30, CDT: 3/4, AFT: 5/min.   On 02/25/2016: MMSE: 29/30, CDT: 4/4, AFT: 10/min.  On 11/02/2016: MMSE: 29/30, CDT: 4/4, AFT: 13/min.  On 07/04/2017: MMSE: 27/30, CDT: 4/4, AFT: 14/min.   Cranial nerves II - XII are as described above under HEENT exam. In addition: shoulder shrug is normal with unequal, right shoulder is lower.. Motor exam: Normal bulk, strength globally of 4 out of 5, significant limitation in range of motion of the right shoulder. She has no significant dyskinesias. Tone is increased throughout, right more than left. She has moderate bradykinesia. She has no significant resting tremor today. Romberg is not testable safely, reflexes are 1+ in the upper extremities and trace in both knees, absent in the ankles. Fine motor skills are moderate to severely impaired, right side more pronounced than left. I did not feel it was safe for her to stand or walk for me, she has no walking aid with her today. Sensory exam: intact to light touch.   Assessment and plan:  In summary, Carol Mckee is a very pleasant 76 year old female with an underlying complex medical history of lumbar spinal stenosis, hip pain, status  post hiatal hernia surgery, kidney stone surgery, bladder surgery, hysterectomy, breast biopsy, tonsillectomy and adenoidectomy, recurrent UTI, right shoulder injury, sleep apnea, who presents for follow-up consultation of her advanced right-sided predominant Parkinson's disease, complicated by mild memory loss, chronic pain, bladder dysfunction, deconditioning, lower extremity swelling, Chronic constipation. She has had more trouble using her CPAP. She feels that the  pressure is now tolerable but still she has significant sleep disruption secondary to nocturia and pain. She does average about 3 hours of usage on a night to night basis and feels that she benefits from it and at least it is tolerable at the current pressure settings and leak is too high inot too high at this time. She is encouraged to continue with CPAP, she can also continue with melatonin at night. Sinemet CR at bedtime and even in the middle of the night has not made a difference and we mutually agreed to stop it at this time. She has in the interim increased her Sinemet to 2 pills 6 times a day and we agreed to continue with the current regimen.  Memory and her perception has been stable, her husband agrees with it. Scores are also plus minus stable today.  Her lower extremity swelling is stable, she has been able to use compression stockings up to the knees bilaterally which have been helpful. She also uses a diuretic as needed.  She has not driven a car in 8 years.  She has chronic issues with low back pain and right shoulder dysfunction. She reports having had a shoulder MRI previously for this. I would like to see her back in 4 months, sooner as needed. I answered all their questions today and the patient and her husband were in agreement. I spent 25 minutes in total face-to-face time with the patient, more than 50% of which was spent in counseling and coordination of care, reviewing test results, reviewing medication and discussing or  reviewing the diagnosis of PD and OSA, the prognosis and treatment options. Pertinent laboratory and imaging test results that were available during this visit with the patient were reviewed by me and considered in my medical decision making (see chart for details).

## 2017-07-25 ENCOUNTER — Telehealth: Payer: Self-pay | Admitting: Neurology

## 2017-07-25 NOTE — Telephone Encounter (Signed)
Per Dr Frances Furbish, nothing more to offer at this time. She is already on high dose of sinemet. Dr Frances Furbish not comfortable increasing d/t potential SE (hallucincation, confusion). They should continue to monitor her and keep scheduled f/u. This is to be expected with a progressive disease like this.

## 2017-07-25 NOTE — Telephone Encounter (Signed)
Patient's husband calling to discuss carbidopa-levodopa (SINEMET IR) 25-100 MG tablet. It works but starts giving out before next dosage is due.

## 2017-07-25 NOTE — Telephone Encounter (Signed)
Called and spoke with husband. Relayed message below. He verbalized understanding and will call back with any further questions or concerns.

## 2017-10-29 ENCOUNTER — Other Ambulatory Visit: Payer: Self-pay | Admitting: Neurology

## 2017-11-02 ENCOUNTER — Encounter: Payer: Self-pay | Admitting: Neurology

## 2017-11-07 ENCOUNTER — Ambulatory Visit: Payer: Medicare Other | Admitting: Neurology

## 2017-11-07 ENCOUNTER — Encounter: Payer: Self-pay | Admitting: Neurology

## 2017-11-07 VITALS — BP 120/65 | HR 74

## 2017-11-07 DIAGNOSIS — K5909 Other constipation: Secondary | ICD-10-CM

## 2017-11-07 DIAGNOSIS — R351 Nocturia: Secondary | ICD-10-CM | POA: Diagnosis not present

## 2017-11-07 DIAGNOSIS — Z9989 Dependence on other enabling machines and devices: Secondary | ICD-10-CM | POA: Diagnosis not present

## 2017-11-07 DIAGNOSIS — M7989 Other specified soft tissue disorders: Secondary | ICD-10-CM | POA: Diagnosis not present

## 2017-11-07 DIAGNOSIS — G20 Parkinson's disease: Secondary | ICD-10-CM | POA: Diagnosis not present

## 2017-11-07 DIAGNOSIS — R413 Other amnesia: Secondary | ICD-10-CM | POA: Diagnosis not present

## 2017-11-07 DIAGNOSIS — G4733 Obstructive sleep apnea (adult) (pediatric): Secondary | ICD-10-CM | POA: Diagnosis not present

## 2017-11-07 MED ORDER — CARBIDOPA-LEVODOPA ER 50-200 MG PO TBCR: 1.0000 | EXTENDED_RELEASE_TABLET | Freq: Every day | ORAL | 3 refills | Status: DC

## 2017-11-07 MED ORDER — CARBIDOPA-LEVODOPA 25-100 MG PO TABS: ORAL_TABLET | ORAL | 5 refills | Status: DC

## 2017-11-07 NOTE — Progress Notes (Signed)
Pt asked that her D[facility]. Received a receipt of confirmation.

## 2017-11-07 NOTE — Patient Instructions (Addendum)
We will try to increase your Sinemet to 2 pills 7 times a day, at 5am, 8am, 11am, 2pm, 5pm, 8pm and 10:30 PM. We will also continue with the long acting Sinemet CR 50/200 mg in the middle of the night.  Please be very mindful and proactive about constipation.

## 2017-11-07 NOTE — Progress Notes (Signed)
+Subjective:    Patient ID: Carol Mckee is a 76 y.o. female.  HPI     Interim history:   Carol Mckee is a very pleasant 76 year old ambidextrous female with an underlying medical history of lumbar spinal stenosis, hip pain, status post hiatal hernia surgery, kidney stone surgery, bladder surgery, hysterectomy, breast biopsy, tonsillectomy and adenoidectomy, who presents for followup consultation of her advanced, right-sided predominant Parkinson's disease, complicated by memory loss, LBP on chronic pain medication, bladder incontinence and gait disorder, sleep d/o and overall deconditioning secondary to progression and advancing age. She is accompanied by her husband again today. I last saw her on 07/04/2017, at which time she was not able to use her CPAP very much. She did not feel that the Sinemet CR was of any help. She had taken a fall and bruised herself, admitted to turning suddenly due to her phone ringing. She tried taking the Sinemet CR in the middle of the night which was also not helpful and we mutually agreed to stop it. She had in the interim increased her immediate release Sinemet to 2 pills 6 times a day and I suggested we continue with this.  Today, 11/07/2017: I reviewed her CPAP compliance data from 10/04/2017 through 11/02/2017, which is a total of 30 days, during which time she used her machine 22 days but percent used days greater than 4 hours was 0, indicating poor compliance, average usage of only 1 hour and 42 minutes, residual AHI 3.9 per hour, pressure at 8 cm, leak acceptable generally speaking. In the past 90 days her percent used days greater than 4 hours was again very low, about 2%. She reports having d difficulty keeping the CPAP on at night. It pinches at her nasal bridge. She is using a fullface mask. She would like to try nasal pillows. When she gets up to use the bathroom at night she does not put it back on. She has restarted taking her Sinemet CR after coming off of  it and realizing that it was helpful in the middle of the night. She typically takes it somewhere between midnight and 2 AM. She takes Sinemet immediate release 2 pills 6 times a day starting at 5 AM every 3 hours, last dose at 8 PM. Bedtime is around 10:30 PM. Thankfully, she has not fallen, memory is stable, mood is stable.   The patient's allergies, current medications, family history, past medical history, past social history, past surgical history and problem list were reviewed and updated as appropriate.    Previously (copied from previous notes for reference):   I saw her on 03/02/2017, at which time she had not been using her CPAP for a month or 2 prior. She felt that the pressure was too high. She had a lot of air leakage as well. Her husband had called in March reporting that her Sinemet was wearing off quicker. I did ask her to increase Sinemet cautiously by alternating 2 pills with 1-1/2 pills for a total of 6 doses per day. Memory and mood were stable. She did have more swelling of the lower extremities. Thankfully, she had no recent falls. I suggested we continue with her medication regimen. With the exception that we add a Sinemet CR at bedtime. I also suggested we reduced her CPAP pressure to 12 cm for better tolerance and that she meet with our sleep lab manager Robin for her mask issues.   Her husband called in the interim in May 2018 wondering if we could  increase her Sinemet further, as previously discussed. I suggested we increase her Sinemet to 2 pills 6 times a day.    I saw her on 11/02/2016, at which time she reported feeling stable. Unfortunately, she had complications with UTI and urosepsis which led to hospitalization in July 2017. She also had admission in September 2017 for kidney stones and lithotripsy. She had lost weight. She was still using her CPAP regularly but was more bothered by it during the night. She was taking hydrocodone 4 times a day. I suggested we maintain  Sinemet at 1-1/2 pills 6 times a day. Her memory scores were good, stable.   I reviewed her CPAP compliance data from the past 90 days, during which time she used her CPAP 51 days and percent used days greater than 4 hours was 14% only.   I saw her on 06/28/2016, at which time she reported ongoing low back pain, was taking hydrocodone 1 pill in the morning and in the evening and half a pill 4 times a day in between, was followed by a new pain management doctor. She had a hospitalization in the interim secondary to altered mental status and was found to have a urinary tract infection. She was admitted to the hospital on 05/26/2016 and discharged on 06/02/2016, was found to have urosepsis, treated with anti-biotics IV. She had a head CT without contrast upon admission on 05/26/2016 and I reviewed the findings: IMPRESSION: Image quality degraded by motion  Left frontal hypodensities could represent acute or chronic ischemia. Correlate with symptoms and MRI. Progressive chronic ischemic changes in the left basal ganglia. Patient and her family declined an MRI brain as I understand. Finished HH therapy.   I saw her on 02/25/2016, at which time she was compliant with CPAP therapy. She reported pain while turning in bed, nocturia, no recent UTI. She sees a Dealer in [place]. Memory was stable. She had no recent fall. She needed new CPAP supplies. Her MMSE was 29/30, CDT: 4/4, AFT: 10/min at the time. I suggested we try to increase Sinemet to 1-1/2 pills 6 times a day, every 3 hours, starting at 5 AM.   I reviewed her CPAP compliance data from 05/25/2016 through 06/23/2016 which is a total of 30 days during which time she used her machine 23 days with percent used days greater than 4 hours at 63%, indicating suboptimal compliance because of a gap of one week in June. Average usage of 5 hours and 8 minutes, residual AHI borderline at 6.7, leak at times high with the 95th percentile at 29.2 L/m at a pressure  of 15 cm with EPR of 3.   I saw her on 08/28/2015, at which time she reported ongoing issues with low back pain. She also had right shoulder pain. She had severe limitation in her range of motion. She was using a 2 wheeled walker. She was sleeping and in hospital bed. In the early morning she would shift into a lift chair. She would have to get up multiple times at night for bathroom breaks, up to 6 times per night, her husband was having more health issues himself of the past 1 year. She was trying to be compliant with CPAP therapy but AHI and leak were both high. I suggested she continue with Sinemet at the current dose, 1-1/2 pills 5 times a day.   I reviewed her CPAP compliance data from 01/25/2016 through 02/23/2016 which is a total of 30 days during which time she used her  machine every night with 100% compliance, average usage of 9 hours and 43 minutes, residual AHI 26.7 per hour, leaked high at 49.8 L/m for the 95th percentile on a pressure of 14 cm with EPR of 3. Residual apneas appeared to be obstructive in nature.   I saw her on 02/25/2015, at which time she reported that she was continuing to struggle with the CPAP mask. She had trouble closing her mouth. She was using a fullface mask with a chinstrap. Unfortunately, she had fallen from her recliner in December. She had been in her lift chair and fell asleep and accidentally pushed the control button. She fell forward onto her face. She did not have any LOC or residual headaches. She was on 7 1/2 pills of Sinemet daily. She was having ongoing issues with low back pain. In the past she had injection treatment with variable results, under Dr. Francesco Runner for this. She was on hydrocodone 3 times a day. She had nocturia multiple times a night which was very bothersome to her. She felt her memory and mood were stable.    I reviewed her CPAP compliance data from 07/27/2015 through 08/25/2015 which is a total of 30 days during which time she used her  machine every night, with percent used days greater than 4 hours at 100%, indicating superb compliance with an average usage of 10 hours and 27 minutes, residual AHI high at 27.1 per hour, leak I with the 95th percentile at 54.5 L/m on a pressure of 14 cm with EPR of 3. Looking at her AHI breakdown, it looks like her residual sleep disordered breathing is primarily obstructive in nature. She was supposed to switch to AutoPap therapy but appears to be still on the same setting of 14 cm.   I saw her on 08/26/2014, at which time she had some residual snoring according to her husband. She was using a fullface mask. Parkinson's-wise, she felt stable. I changed her from AutoPap to a set pressure of 14 cm.    I reviewed her CPAP compliance data from 01/25/2015 through 02/23/2015 which is a total of 30 days during which time she used her machine every night with percent used days greater than 4 hours at 100%, indicating superb compliance on her behalf with an average usage of 9 hours and 34 minutes and pressure has now been changed to 14 cm with EPR of 3 but AHI was suboptimal at 14.4, probably due to very high leak with the 95th percentile of leak at 78.6 L/m.     I saw her on 05/02/2014, at which time I talked her about her recent sleep study results and her CPAP treatment. She had done well with that. I changed her from AutoPap to CPAP of 14 cm. She changed from a nasal mask to a full face mask. I asked her to not drive anymore. I asked her to continue with her Parkinson's medications. I changed the timing to 6, 10, 2, 6 PM and 10 PM and I encouraged her to continue using CPAP regularly.   I reviewed her compliance data from 07/24/2014 through 08/22/2014 which is a total of 30 days during which time she was 100% compliant. Her residual AHI was unfortunately high at 23.5. 95th percentile pressure was 12.8 but leak was very high at times. She is supposed to be on CPAP but is still on AutoPap from 8-13 with EPR of  3.   I saw her on 02/21/2014, at which time we talked about her  signs and symptoms concerning for sleep apnea and I suggested a sleep study. I also advised her to try to reduce sedating medications including her Valium as well as her hydrocodone. She was advised to use her walker at all times. She felt that physical therapy was helpful. She was asking if she could drive. She had been to the [facility] office, and was discouraged from driving. She sees Dr. Francesco Runner in [place], [place] for pain. She had been taking norco 1 pill tid and 1/2 pill bid, alternating. She is taking C/L 1 1/2 pills 5 times a day.   She has sleep study on 02/27/2014 and went over her test results with her in detail today. Her baseline sleep efficiency was 100% with 100% of stage II sleep only. She had no significant EKG changes or periodic leg movements of sleep. Her total AHI was high at 54.5 per hour. Baseline oxygen saturation was 94%, nadir was 86%. She was titrated on CPAP from 5-8 centimeters of water pressure. She was noted to have central apneas. She was noted to have mouth opening. On a pressure of 8 cm a residual AHI was 6.5 per hour. She had no REM sleep. I placed the patient on CPAP of 9 cm. 03/20/2014 to 04/08/2014, which is a total of 21 days, during which time the patient used CPAP every day. The average usage for all days was 10 hours and 10 minutes. The percent used days greater than 4 hours was 100 %, indicating superb compliance. The residual AHI was unfortunately high at 22.4 per hour, indicating an inadequate treatment pressure of 9 cwp with EPR of 3. Her residual AHI is primarily obstructive in nature per AHI breakdown. Air leak from the mask was also elevated with 38.1 L per minute at the 95th percentile. Earlier this month based on these results I placed her on AutoPap as opposed to CPAP.    I reviewed the patient's PAP compliance data from 04/16/2014 to 05/01/2014, which is a total of 16 days, during which time the patient used  CPAP every day. The average usage for all days was 10 hours and 12 minutes. The percent used days greater than 4 hours was 100 %, indicating superb compliance. The residual AHI was unfortunately elevated at 14.9 per hour. The pressure setting is from 8-13 cm. 95th percentile pressure was 12.9. Her leak was at times high.   I saw her on 10/04/2013, at which time I did not make changes to her medications but made a referral to physical therapy and she wanted to stay locally in [place], [place], for this. We talked about potentially starting her on Exelon for memory loss.   I first met her on 05/01/2013, which time I suggested that she increase her Sinemet to 1-1/2 pills 5 times a day. I also reinforced the importance of using her walker at all times as she was at fall risk. She called back in August 2014 and reported an improvement in her symptoms.   She previously followed with Dr. Morene Antu and was last seen by him on 01/01/2013, at which time he felt that she would benefit from physical therapy due to her lumbar spine stenosis with chronic lower back pain. He also increased her carbidopa-levodopa to 1-1/2 pills at 5 AM, one pill at 9 AM, one pill at 1 PM, 1 1/2 pills at 5 PM and one pill at 9 PM. Her falls assessment tool score at the time was 18.   She has an approximately  12-year history of Parkinson's disease treated with carbidopa-levodopa 25-100 mg strength 5 times daily. She developed lower back pain and was treated with hydrocodone, oxycodone and fentanyl patch. She also received epidural steroid injections. She had an EEG in May 2011, which did not show any epileptiform discharges. MRI L-spine in March 2013 showed scoliosis, and severe degenerative joint disease. She has had jerking movements in her legs. This does not occur while lying down but mostly with sitting or standing. She has developed bladder incontinence. In January 2014 her MMSE was 22, clock drawing was 4, animal fluency was 8.   She has a  hospital bed with rails up at night. He helps her to use a bed pan. She has to urinate 2-5 times per night. She can walk about 15 feet at a time with 2 wheeled walker. She denies VH or AH and has not had dyskinesias. She lives close to [place] and had PT there. She had epidural injection at [facility] in the past, and at [facility] with good relief.  Her Past Medical History Is Significant For: Past Medical History:  Diagnosis Date  . Abnormality of gait   . Chronic kidney disease   . Edema   . Fatigue   . Other chronic pulmonary heart diseases   . Paralysis agitans ([place])   . Parkinson disease ([place])   . Parkinson's disease ([place]) 10/04/2013  . Pyelonephritis 06/2016   pt states hospitalized for kidney infection/sepsis  . Sleep apnea    pt does use cpap  . Spinal stenosis   . Stiffness of joint, not elsewhere classified,  shoulder region     Her Past Surgical History Is Significant For: Past Surgical History:  Procedure Laterality Date  . ABDOMINAL HYSTERECTOMY    . COLONOSCOPY     x3  . CYSTOSCOPY WITH STENT PLACEMENT Left 07/03/2016   Procedure: CYSTOSCOPY WITH STENT PLACEMENT;  Surgeon: Raynelle Bring, MD;  Location: [facility];  Service: Urology;  Laterality: Left;    Her Family History Is Significant For: No family history on file.  Her Social History Is Significant For: Social History   Socioeconomic History  . Marital status: Married    Spouse name: Pilar Plate  . Number of children: 3  . Years of education: 73  . Highest education level: None  Social Needs  . Financial resource strain: None  . Food insecurity - worry: None  . Food insecurity - inability: None  . Transportation needs - medical: None  . Transportation needs - non-medical: None  Occupational History  . Occupation: RETIRED  Tobacco Use  . Smoking status: Never Smoker  . Smokeless tobacco: Never Used  Substance and Sexual Activity  . Alcohol use: No  . Drug use: No  . Sexual activity: None  Other Topics Concern   . None  Social History Narrative   Patient is right handed.resides in a home with husband    Her Allergies Are:  Allergies  Allergen Reactions  . Azithromycin Diarrhea  . Lyrica [Pregabalin]     Drops blood pressure  . Tizanidine Other (See Comments)    hypotension  . Erythromycin Diarrhea  :   Her Current Medications Are:  Outpatient Encounter Medications as of 11/07/2017  Medication Sig  . baclofen (LIORESAL) 10 MG tablet Take 10 mg by mouth daily.   . carbidopa-levodopa (SINEMET IR) 25-100 MG tablet Take 2 tablets by mouth 6 (six) times daily. 2 pills at 5am, 8am, 11am, 2pm, 5pm, and 8pm.  . carbidopa-levodopa (SINEMET IR)  25-100 MG tablet TAKE 2 TABLETS AT 5AM, 8AM, 11AM, 2PM, 5PM, AND 8PM.  . Cholecalciferol (VITAMIN D3) 5000 UNITS CAPS Take 1 capsule by mouth daily.  . DULoxetine (CYMBALTA) 30 MG capsule Take 30 mg by mouth daily.   . furosemide (LASIX) 20 MG tablet Take 20 mg by mouth daily as needed.  Marland Kitchen HYDROcodone-acetaminophen (NORCO/VICODIN) 5-325 MG per tablet Take 0.5-1 tablets by mouth as needed. TAKES ONE TABLET IN THE MORNING AND TAKES ONE-HALF TABLET 5 TIMES DAILY  . KRILL OIL PO Take 1 capsule by mouth daily.  . magnesium oxide (MAG-OX) 400 MG tablet Take 400 mg by mouth daily.  . Multiple Vitamin (MULTIVITAMIN) tablet Take 1 tablet by mouth daily.  . polyethylene glycol (MIRALAX / GLYCOLAX) packet Take 17 g by mouth daily.  . vitamin E 400 UNIT capsule Take 400 Units by mouth daily.  . VOLTAREN 1 % GEL Apply 1 application topically daily as needed (for pain).   . carbidopa-levodopa (SINEMET CR) 50-200 MG tablet Take 1 tablet by mouth at bedtime.   No facility-administered encounter medications on file as of 11/07/2017.   :  Review of Systems:  Out of a complete 14 point review of systems, all are reviewed and negative with the exception of these symptoms as listed below: Review of Systems  Neurological:       Pt presents today to discuss her PD and cpap. Pt  takes off her cpap when she uses the restroom at night.    Objective:  Neurological Exam  Physical Exam Physical Examination:   Vitals:   11/07/17 1401  BP: 120/65  Pulse: 74    General Examination: The patient is a very pleasant 76 y.o. female in no acute distress. She appears well-developed and well-nourished and well groomed. Good sprits.   HEENT:Normocephalic, atraumatic, pupils are equal, round and reactive to light and accommodation. She wears corrective eyeglasses. She has difficulty tracking, no nystagmus is noted. She has a moderate decrease in eye blink rate. Hearing is impaired. Face is symmetric with moderate facial masking. Airway examination is unchanged. She has no significant sialorrhea today. Speech is moderately hypophonic. She has very minimal dysarthria. All stable.   Chest:Clear to auscultation without wheezing, rhonchi or crackles noted.  Heart:S1+S2+0, regular and normal without murmurs, rubs or gallops noted.   Abdomen:Soft, non-tender and non-distended with normal bowel sounds appreciated on auscultation.  Extremities:There is 1-2+pitting edema in the distal lower extremities bilaterally. Right side more pronounced than left, wearing compression stockings up to knees, all stable.   Skin: Warm and dry without trophic changes noted. Changes to legs as above.  Musculoskeletal: exam reveals no obvious joint deformities, tenderness or joint swelling or erythema, with the exception of unchanged severe decrease and active range of motion in her right shoulder, she does have better mobility passively, no significant crepitation.  Neurologically:  Mental status: The patient is awake, alert and oriented in all 4 spheres. Herimmediate and remote memory, attention, language skills and fund of knowledge are mildly impaired. There is no evidence of aphasia, agnosia, apraxia or anomia. Thought process is linear. Mood is normaland affect is normal.   On  08/26/2014: MMSE 25/30, CDT 2/4, AFT: 9.   On 02/25/2015: MMSE: 27/30, CDT: 3/4, AFT: 8/min, GDS: 2/15.  On 08/28/2015: MMSE: 26/30, CDT: 3/4, AFT: 5/min.   On 02/25/2016: MMSE: 29/30, CDT: 4/4, AFT: 10/min.  On 11/02/2016: MMSE: 29/30, CDT: 4/4, AFT: 13/min.  On 07/04/2017: MMSE: 27/30, CDT: 4/4, AFT: 14/min.  Cranial nerves II - XII are as described above under HEENT exam. In addition: shoulder shrug is normal with unequal, right shoulder is lower, stable. Motor exam: Normal bulk, strength globally of 4 out of 5, significant limitation in range of motion of the right shoulder. She has no significant dyskinesias. Tone is increased throughout, right more than left. She has moderate bradykinesia. She has an intermittent mild to moderate right upper extremity resting tremor and a left foot tremor today. Romberg is not testable safely, Reflexes are 1+ in the upper extremities and trace in both knees, absent in the ankles. Fine motor skills are moderate to severely impaired, right side more pronounced than left. I did not feel it was safe for her to stand or walk for me, she has no walking aid with her today. In [place]. Sensory exam: intact to light touch.   Assessment and plan:  In summary, Carol Mckee a very pleasant 76 year old female with an underlying complex medical history of lumbar spinal stenosis, hip pain, status post hiatal hernia surgery, kidney stone surgery, bladder surgery, hysterectomy, breast biopsy, tonsillectomy and adenoidectomy, recurrent UTI, right shoulder injury, sleep apnea, who presents for follow-up consultation of her advanced right-sided predominant Parkinson's disease, complicated by mild memory loss, chronic pain, bladder dysfunction, deconditioning, lower extremity swelling, chronic constipation. She has had  trouble keeping her CPAP on. She does seem to try most nights. She is commended for trying. I will prescribe nasal pillows, sized to fit. She may be able to use  these easier but if she does have significant mouth opening and mouth venting employed it will not seal enough. It may not be feasible for her to try to use a chinstrap on top of that. Nevertheless, we can try. She restarted her Sinemet CR after stopping it realizing that it was somewhat helpful in the middle of the night. She has been taking Sinemet IR 2 pills 6 times a day last dose at 8 PM. We mutually agreed to increase this to 2 pills 7 times a day with the last dose at bedtime which is around 10:30 PM. She can continue with his Sinemet CR in the middle of the night. I adjusted both prescriptions. I suggested a four-month recheck, we will continue to monitor her memory but it has been stable over the past few years. She does have a history of parasomnias including talking in her sleep and also remote history of dream enactments. I answered all their questions today and the patient and her husband were in agreement.  Of note, she has not driven a car in 8 years. She has chronic issues with low back pain and right shoulder dysfunction. She reports having had a shoulder MRI previously for this. She reports midline and right-sided low back pain.  I spent 25 minutes in total face-to-face time with the patient, more than 50% of which was spent in counseling and coordination of care, reviewing test results, reviewing medication and discussing or reviewing the diagnosis of PD and OSA, its prognosis and treatment options. Pertinent laboratory and imaging test results that were available during this visit with the patient were reviewed by me and considered in my medical decision making (see chart for details).

## 2017-11-23 ENCOUNTER — Other Ambulatory Visit: Payer: Self-pay | Admitting: Neurology

## 2018-03-08 ENCOUNTER — Encounter: Payer: Self-pay | Admitting: Neurology

## 2018-03-13 ENCOUNTER — Encounter: Payer: Self-pay | Admitting: Neurology

## 2018-03-13 ENCOUNTER — Ambulatory Visit: Payer: Medicare Other | Admitting: Neurology

## 2018-03-13 VITALS — BP 143/72 | HR 78

## 2018-03-13 DIAGNOSIS — K5909 Other constipation: Secondary | ICD-10-CM

## 2018-03-13 DIAGNOSIS — Z9989 Dependence on other enabling machines and devices: Secondary | ICD-10-CM

## 2018-03-13 DIAGNOSIS — M7989 Other specified soft tissue disorders: Secondary | ICD-10-CM | POA: Diagnosis not present

## 2018-03-13 DIAGNOSIS — G20 Parkinson's disease: Secondary | ICD-10-CM | POA: Diagnosis not present

## 2018-03-13 DIAGNOSIS — R413 Other amnesia: Secondary | ICD-10-CM | POA: Diagnosis not present

## 2018-03-13 DIAGNOSIS — R351 Nocturia: Secondary | ICD-10-CM

## 2018-03-13 DIAGNOSIS — G4733 Obstructive sleep apnea (adult) (pediatric): Secondary | ICD-10-CM | POA: Diagnosis not present

## 2018-03-13 MED ORDER — CARBIDOPA-LEVODOPA 25-100 MG PO TABS: ORAL_TABLET | ORAL | 3 refills | Status: DC

## 2018-03-13 NOTE — Patient Instructions (Addendum)
Please take your Sinement CR (the longacting) at 10:30 PM, when you take your last dose of Sinemet immediate release.  Try the best you can with your CPAP.

## 2018-03-13 NOTE — Progress Notes (Signed)
Subjective:    Patient ID: Carol Mckee is a 77 y.o. female.  HPI     Interim history:   Ms. Carol Mckee is a very pleasant 77 year old ambidextrous female with an underlying medical history of lumbar spinal stenosis, hip pain, status post hiatal hernia surgery, kidney stone surgery, bladder surgery, hysterectomy, breast biopsy, tonsillectomy and adenoidectomy, who presents for followup consultation of her advanced, right-sided predominant Parkinson's disease, complicated by memory loss, LBP on chronic pain medication, bladder incontinence and gait disorder, sleep d/o and overall deconditioning secondary to progression and advancing age. She is accompanied by her husband again today. I last saw her on 11/07/2017, at which time she was having ongoing difficulty with CPAP at night. She had restarted taking her Sinemet CR as it was helpful. She was taking Sinemet 2 pills 6 times a day. Memory was stable. I suggested we increase her Sinemet to 2 pills 7 times a day.   Today, 03/13/2018: She reports that she takes her Sinemet consistently 2 pills 7 times a day but the CR not every night. She only uses it if she wakes up in the middle of the night, she has trouble moving in the morning, also freezing spells in the mornings. She has experienced significant slowness of movements and a very significant drained feeling and fatigue by the end of the afternoon. This happens almost every afternoon and spontaneously seems to go away. She has not found a trigger. Of note, she no longer takes her hydrocodone. She decided to stay off of it. She has been on Cymbalta 30 mg daily but reports that she actually does not take it every day, has not taken it in 2 weeks. She has not informed her pain management physician about this. She would like to stay off of this as well. She has occasional leg cramps. She has ongoing issues with lower extremity edema and uses compression stockings. She has not noticed any significant worsening of  her memory or mood, she has not noticed any significant motor progression. Overall, she feels stable. She does tolerate the Sinemet at the current dose.   The patient's allergies, current medications, family history, past medical history, past social history, past surgical history and problem list were reviewed and updated as appropriate.    Previously (copied from previous notes for reference):   I reviewed her CPAP compliance data from 10/04/2017 through 11/02/2017, which is a total of 30 days, during which time she used her machine 22 days but percent used days greater than 4 hours was 0, indicating poor compliance, average usage of only 1 hour and 42 minutes, residual AHI 3.9 per hour, pressure at 8 cm, leak acceptable generally speaking. In the past 90 days her percent used days greater than 4 hours was again very low, about 2%.  I saw her on 07/04/2017, at which time she was not able to use her CPAP very much. She did not feel that the Sinemet CR was of any help. She had taken a fall and bruised herself, admitted to turning suddenly due to her phone ringing. She tried taking the Sinemet CR in the middle of the night which was also not helpful and we mutually agreed to stop it. She had in the interim increased her immediate release Sinemet to 2 pills 6 times a day and I suggested we continue with this.     I saw her on 03/02/2017, at which time she had not been using her CPAP for a month or 2 prior.  She felt that the pressure was too high. She had a lot of air leakage as well. Her husband had called in March reporting that her Sinemet was wearing off quicker. I did ask her to increase Sinemet cautiously by alternating 2 pills with 1-1/2 pills for a total of 6 doses per day. Memory and mood were stable. She did have more swelling of the lower extremities. Thankfully, she had no recent falls. I suggested we continue with her medication regimen. With the exception that we add a Sinemet CR at bedtime. I  also suggested we reduced her CPAP pressure to 12 cm for better tolerance and that she meet with our sleep lab manager Robin for her mask issues.   Her husband called in the interim in May 2018 wondering if we could increase her Sinemet further, as previously discussed. I suggested we increase her Sinemet to 2 pills 6 times a day.   I saw her on 11/02/2016, at which time she reported feeling stable. Unfortunately, she had complications with UTI and urosepsis which led to hospitalization in July 2017. She also had admission in September 2017 for kidney stones and lithotripsy. She had lost weight. She was still using her CPAP regularly but was more bothered by it during the night. She was taking hydrocodone 4 times a day. I suggested we maintain Sinemet at 1-1/2 pills 6 times a day. Her memory scores were good, stable.   I reviewed her CPAP compliance data from the past 90 days, during which time she used her CPAP 51 days and percent used days greater than 4 hours was 14% only.   I saw her on 06/28/2016, at which time she reported ongoing low back pain, was taking hydrocodone 1 pill in the morning and in the evening and half a pill 4 times a day in between, was followed by a new pain management doctor. She had a hospitalization in the interim secondary to altered mental status and was found to have a urinary tract infection. She was admitted to the hospital on 05/26/2016 and discharged on 06/02/2016, was found to have urosepsis, treated with anti-biotics IV. She had a head CT without contrast upon admission on 05/26/2016 and I reviewed the findings: IMPRESSION: Image quality degraded by motion  Left frontal hypodensities could represent acute or chronic ischemia. Correlate with symptoms and MRI. Progressive chronic ischemic changes in the left basal ganglia. Patient and her family declined an MRI brain as I understand. Finished HH therapy.   I saw her on 02/25/2016, at which time she was compliant with  CPAP therapy. She reported pain while turning in bed, nocturia, no recent UTI. She sees a Dealer in [place]. Memory was stable. She had no recent fall. She needed new CPAP supplies. Her MMSE was 29/30, CDT: 4/4, AFT: 10/min at the time. I suggested we try to increase Sinemet to 1-1/2 pills 6 times a day, every 3 hours, starting at 5 AM.   I reviewed her CPAP compliance data from 05/25/2016 through 06/23/2016 which is a total of 30 days during which time she used her machine 23 days with percent used days greater than 4 hours at 63%, indicating suboptimal compliance because of a gap of one week in June. Average usage of 5 hours and 8 minutes, residual AHI borderline at 6.7, leak at times high with the 95th percentile at 29.2 L/m at a pressure of 15 cm with EPR of 3.   I saw her on 08/28/2015, at which time she reported  ongoing issues with low back pain. She also had right shoulder pain. She had severe limitation in her range of motion. She was using a 2 wheeled walker. She was sleeping and in hospital bed. In the early morning she would shift into a lift chair. She would have to get up multiple times at night for bathroom breaks, up to 6 times per night, her husband was having more health issues himself of the past 1 year. She was trying to be compliant with CPAP therapy but AHI and leak were both high. I suggested she continue with Sinemet at the current dose, 1-1/2 pills 5 times a day.   I reviewed her CPAP compliance data from 01/25/2016 through 02/23/2016 which is a total of 30 days during which time she used her machine every night with 100% compliance, average usage of 9 hours and 43 minutes, residual AHI 26.7 per hour, leaked high at 49.8 L/m for the 95th percentile on a pressure of 14 cm with EPR of 3. Residual apneas appeared to be obstructive in nature.   I saw her on 02/25/2015, at which time she reported that she was continuing to struggle with the CPAP mask. She had trouble closing her mouth.  She was using a fullface mask with a chinstrap. Unfortunately, she had fallen from her recliner in December. She had been in her lift chair and fell asleep and accidentally pushed the control button. She fell forward onto her face. She did not have any LOC or residual headaches. She was on 7 1/2 pills of Sinemet daily. She was having ongoing issues with low back pain. In the past she had injection treatment with variable results, under Dr. Francesco Runner for this. She was on hydrocodone 3 times a day. She had nocturia multiple times a night which was very bothersome to her. She felt her memory and mood were stable.    I reviewed her CPAP compliance data from 07/27/2015 through 08/25/2015 which is a total of 30 days during which time she used her machine every night, with percent used days greater than 4 hours at 100%, indicating superb compliance with an average usage of 10 hours and 27 minutes, residual AHI high at 27.1 per hour, leak I with the 95th percentile at 54.5 L/m on a pressure of 14 cm with EPR of 3. Looking at her AHI breakdown, it looks like her residual sleep disordered breathing is primarily obstructive in nature. She was supposed to switch to AutoPap therapy but appears to be still on the same setting of 14 cm.   I saw her on 08/26/2014, at which time she had some residual snoring according to her husband. She was using a fullface mask. Parkinson's-wise, she felt stable. I changed her from AutoPap to a set pressure of 14 cm.    I reviewed her CPAP compliance data from 01/25/2015 through 02/23/2015 which is a total of 30 days during which time she used her machine every night with percent used days greater than 4 hours at 100%, indicating superb compliance on her behalf with an average usage of 9 hours and 34 minutes and pressure has now been changed to 14 cm with EPR of 3 but AHI was suboptimal at 14.4, probably due to very high leak with the 95th percentile of leak at 78.6 L/m.    I saw her on  05/02/2014, at which time I talked her about her recent sleep study results and her CPAP treatment. She had done well with that. I changed  her from AutoPap to CPAP of 14 cm. She changed from a nasal mask to a full face mask. I asked her to not drive anymore. I asked her to continue with her Parkinson's medications. I changed the timing to 6, 10, 2, 6 PM and 10 PM and I encouraged her to continue using CPAP regularly.   I reviewed her compliance data from 07/24/2014 through 08/22/2014 which is a total of 30 days during which time she was 100% compliant. Her residual AHI was unfortunately high at 23.5. 95th percentile pressure was 12.8 but leak was very high at times. She is supposed to be on CPAP but is still on AutoPap from 8-13 with EPR of 3.   I saw her on 02/21/2014, at which time we talked about her signs and symptoms concerning for sleep apnea and I suggested a sleep study. I also advised her to try to reduce sedating medications including her Valium as well as her hydrocodone. She was advised to use her walker at all times. She felt that physical therapy was helpful. She was asking if she could drive. She had been to the [facility] office, and was discouraged from driving. She sees Dr. Francesco Runner in [place], [place] for pain. She had been taking norco 1 pill tid and 1/2 pill bid, alternating. She is taking C/L 1 1/2 pills 5 times a day.   She has sleep study on 02/27/2014 and went over her test results with her in detail today. Her baseline sleep efficiency was 100% with 100% of stage II sleep only. She had no significant EKG changes or periodic leg movements of sleep. Her total AHI was high at 54.5 per hour. Baseline oxygen saturation was 94%, nadir was 86%. She was titrated on CPAP from 5-8 centimeters of water pressure. She was noted to have central apneas. She was noted to have mouth opening. On a pressure of 8 cm a residual AHI was 6.5 per hour. She had no REM sleep. I placed the patient on CPAP of 9 cm. 03/20/2014 to  04/08/2014, which is a total of 21 days, during which time the patient used CPAP every day. The average usage for all days was 10 hours and 10 minutes. The percent used days greater than 4 hours was 100 %, indicating superb compliance. The residual AHI was unfortunately high at 22.4 per hour, indicating an inadequate treatment pressure of 9 cwp with EPR of 3. Her residual AHI is primarily obstructive in nature per AHI breakdown. Air leak from the mask was also elevated with 38.1 L per minute at the 95th percentile. Earlier this month based on these results I placed her on AutoPap as opposed to CPAP.    I reviewed the patient's PAP compliance data from 04/16/2014 to 05/01/2014, which is a total of 16 days, during which time the patient used CPAP every day. The average usage for all days was 10 hours and 12 minutes. The percent used days greater than 4 hours was 100 %, indicating superb compliance. The residual AHI was unfortunately elevated at 14.9 per hour. The pressure setting is from 8-13 cm. 95th percentile pressure was 12.9. Her leak was at times high.   I saw her on 10/04/2013, at which time I did not make changes to her medications but made a referral to physical therapy and she wanted to stay locally in [place], [place], for this. We talked about potentially starting her on Exelon for memory loss.   I first met her on 05/01/2013, which time  I suggested that she increase her Sinemet to 1-1/2 pills 5 times a day. I also reinforced the importance of using her walker at all times as she was at fall risk. She called back in August 2014 and reported an improvement in her symptoms.   She previously followed with Dr. Morene Antu and was last seen by him on 01/01/2013, at which time he felt that she would benefit from physical therapy due to her lumbar spine stenosis with chronic lower back pain. He also increased her carbidopa-levodopa to 1-1/2 pills at 5 AM, one pill at 9 AM, one pill at 1 PM, 1 1/2 pills at 5 PM and  one pill at 9 PM. Her falls assessment tool score at the time was 18.   She has an approximately 12-year history of Parkinson's disease treated with carbidopa-levodopa 25-100 mg strength 5 times daily. She developed lower back pain and was treated with hydrocodone, oxycodone and fentanyl patch. She also received epidural steroid injections. She had an EEG in May 2011, which did not show any epileptiform discharges. MRI L-spine in March 2013 showed scoliosis, and severe degenerative joint disease. She has had jerking movements in her legs. This does not occur while lying down but mostly with sitting or standing. She has developed bladder incontinence. In January 2014 her MMSE was 22, clock drawing was 4, animal fluency was 8.   She has a hospital bed with rails up at night. He helps her to use a bed pan. She has to urinate 2-5 times per night. She can walk about 15 feet at a time with 2 wheeled walker. She denies VH or AH and has not had dyskinesias. She lives close to [place] and had PT there. She had epidural injection at [facility] in the past, and at [facility] with good relief.  Her Past Medical History Is Significant For: Past Medical History:  Diagnosis Date  . Abnormality of gait   . Chronic kidney disease   . Edema   . Fatigue   . Other chronic pulmonary heart diseases   . Paralysis agitans ([place])   . Parkinson disease ([place])   . Parkinson's disease ([place]) 10/04/2013  . Pyelonephritis 06/2016   pt states hospitalized for kidney infection/sepsis  . Sleep apnea    pt does use cpap  . Spinal stenosis   . Stiffness of joint, not elsewhere classified,  shoulder region     Her Past Surgical History Is Significant For: Past Surgical History:  Procedure Laterality Date  . ABDOMINAL HYSTERECTOMY    . COLONOSCOPY     x3  . CYSTOSCOPY WITH STENT PLACEMENT Left 07/03/2016   Procedure: CYSTOSCOPY WITH STENT PLACEMENT;  Surgeon: Raynelle Bring, MD;  Location: [facility];  Service: Urology;  Laterality: Left;     Her Family History Is Significant For: No family history on file.  Her Social History Is Significant For: Social History   Socioeconomic History  . Marital status: Married    Spouse name: Pilar Plate  . Number of children: 3  . Years of education: 51  . Highest education level: Not on file  Occupational History  . Occupation: RETIRED  Social Needs  . Financial resource strain: Not on file  . Food insecurity:    Worry: Not on file    Inability: Not on file  . Transportation needs:    Medical: Not on file    Non-medical: Not on file  Tobacco Use  . Smoking status: Never Smoker  . Smokeless tobacco: Never Used  Substance and Sexual Activity  . Alcohol use: No  . Drug use: No  . Sexual activity: Not on file  Lifestyle  . Physical activity:    Days per week: Not on file    Minutes per session: Not on file  . Stress: Not on file  Relationships  . Social connections:    Talks on phone: Not on file    Gets together: Not on file    Attends religious service: Not on file    Active member of club or organization: Not on file    Attends meetings of clubs or organizations: Not on file    Relationship status: Not on file  Other Topics Concern  . Not on file  Social History Narrative   Patient is right handed.resides in a home with husband    Her Allergies Are:  Allergies  Allergen Reactions  . Azithromycin Diarrhea  . Lyrica [Pregabalin]     Drops blood pressure  . Tizanidine Other (See Comments)    hypotension  . Erythromycin Diarrhea  :   Her Current Medications Are:  Outpatient Encounter Medications as of 03/13/2018  Medication Sig  . carbidopa-levodopa (SINEMET CR) 50-200 MG tablet Take 1 tablet by mouth at bedtime.  . carbidopa-levodopa (SINEMET IR) 25-100 MG tablet 2 pills 7 times a day, at 5am, 8am, 11am, 2pm, 5pm, 8pm and 10:30 PM.  . Cholecalciferol (VITAMIN D3) 5000 UNITS CAPS Take 1 capsule by mouth daily.  . DULoxetine (CYMBALTA) 30 MG capsule Take 30 mg  by mouth daily.   . furosemide (LASIX) 20 MG tablet Take 20 mg by mouth daily as needed.  Marland Kitchen KRILL OIL PO Take 1 capsule by mouth daily.  . magnesium oxide (MAG-OX) 400 MG tablet Take 400 mg by mouth daily.  . Multiple Vitamin (MULTIVITAMIN) tablet Take 1 tablet by mouth daily.  . polyethylene glycol (MIRALAX / GLYCOLAX) packet Take 17 g by mouth daily.  . vitamin E 400 UNIT capsule Take 400 Units by mouth daily.  . VOLTAREN 1 % GEL Apply 1 application topically daily as needed (for pain).   . [DISCONTINUED] baclofen (LIORESAL) 10 MG tablet Take 10 mg by mouth daily.   . [DISCONTINUED] carbidopa-levodopa (SINEMET IR) 25-100 MG tablet TAKE 2 TABLETS AT 5AM, 8AM, 11AM, 2PM, 5PM, AND 8PM.  . [DISCONTINUED] HYDROcodone-acetaminophen (NORCO/VICODIN) 5-325 MG per tablet Take 0.5-1 tablets by mouth as needed. TAKES ONE TABLET IN THE MORNING AND TAKES ONE-HALF TABLET 5 TIMES DAILY   No facility-administered encounter medications on file as of 03/13/2018.   :  Review of Systems:  Out of a complete 14 point review of systems, all are reviewed and negative with the exception of these symptoms as listed below:   Review of Systems  Neurological:       Pt presents today to discuss her PD, memory, and cpap. Pt reports that she is unable to use her cpap all night.    Objective:  Neurological Exam  Physical Exam Physical Examination:   Vitals:   03/13/18 1406  BP: (!) 143/72  Pulse: 78   General Examination: The patient is a very pleasant 77 y.o. female in no acute distress. She appears well-developed and well-nourished and well groomed. She is situated in her wheelchair. Good spirits.   HEENT:Normocephalic, atraumatic, pupils are equal, round and reactive to light and accommodation. She wears corrective eyeglasses. She has difficulty tracking, no nystagmus is noted. She has a moderate decrease in eye blink rate. Hearing is impaired. Face is  symmetric with moderate facial masking. Airway  examination is unchanged. She has no significant sialorrhea today. Speech is moderately hypophonic. She has very minimal dysarthria. All stable.   Chest:Clear to auscultation without wheezing, rhonchi or crackles noted.  Heart:S1+S2+0, regular and normal without murmurs, rubs or gallops noted.   Abdomen:Soft, non-tender and non-distended with normal bowel sounds appreciated on auscultation.  Extremities:There is1-2+pitting edema in the distal lower extremities bilaterally. Right side more pronounced than left,wearing compression stockings up to knees, all stable.  Skin: Warm and dry without trophic changes noted.   Musculoskeletal: exam reveals no obvious joint deformities, tenderness or joint swelling or erythema, with the exception of unchanged severe decrease and active range of motion in her right shoulder, she does have better mobility passively, no significant crepitation.  Neurologically:  Mental status: The patient is awake, alert and oriented in all 4 spheres. Herimmediate and remote memory, attention, language skills and fund of knowledge are mildly impaired. There is no evidence of aphasia, agnosia, apraxia or anomia. Thought process is linear. Mood is normaland affect is normal.   On 08/26/2014: MMSE 25/30, CDT 2/4, AFT: 9.   On 02/25/2015: MMSE: 27/30, CDT: 3/4, AFT: 8/min, GDS: 2/15.  On 08/28/2015: MMSE: 26/30, CDT: 3/4, AFT: 5/min.   On 02/25/2016: MMSE: 29/30, CDT: 4/4, AFT: 10/min.  On 11/02/2016: MMSE: 29/30, CDT: 4/4, AFT: 13/min.  On7/30/2018: MMSE: 27/30, CDT: 4/4, AFT: 14/min.  On 03/13/2018: MMSE: 25/30, CDT: 4/4, AFT: 15/min.  Cranial nerves II - XII are as described above under HEENT exam. In addition: shoulder shrug is normal with unequal, right shoulder is lower, stable. Motor exam: Normal bulk, strength globally of 4 out of 5, significant limitation in range of motion of the right shoulder.She has no significant dyskinesias. Tone is  increased throughout, right more than left. She has moderate bradykinesia. She has an intermittent mild to moderate right upper extremity resting tremor and a left foot tremor today, minimal resting tremor in the left upper extremity as well. Romberg is not testable safely, Reflexes are 1+ in the upper extremities and trace in both knees, absent in the ankles.Fine motor skills are moderate to severely impaired, right side more pronounced than left. I did not feel it was safe for her to stand or walk for me, she has no walking aid with her today. In [place]. Sensory exam: intact to light touch.   Assessment and plan:  In summary, Aritza Brunet a very pleasant 77 year old female with an underlyingcomplexmedical history of lumbar spinal stenosis, hip pain, status post hiatal hernia surgery, kidney stone surgery, bladder surgery, hysterectomy, breast biopsy, tonsillectomy and adenoidectomy, recurrent UTI, right shoulder injury, sleep apnea, who presents for follow-up consultation of her advanced right-sided predominant Parkinson's disease, complicated by mild memory loss, chronic pain, bladder dysfunction, deconditioning, lower extremity swelling, chronic constipation. She has had  trouble keeping her CPAP on. She does seem to try most nights. She is commended for trying. She is encouraged to use her CPAP as best as she can. She is encouraged to continue with Sinemet 2 pills 7 times a day and I adjusted the prescription for 90 days with refills. She is also advised to stay on Sinemet CR consistently and take it with the last dose of her Sinemet around 10:30 PM. She has been off of hydrocodone and also stopped her Cymbalta on her own. She is advised to notify her pain management physician that regard. We talked about trying to stay healthy with good nutrition, good hydration  and proper rest. She is advised to try to mobilize as best she can. I will see her back in about 6 months, sooner if needed. I answered all  their questions today and the patient and her husband were in agreement. I spent 25 minutes in total face-to-face time with the patient, more than 50% of which was spent in counseling and coordination of care, reviewing test results, reviewing medication and discussing or reviewing the diagnosis of PD, its prognosis and treatment options. Pertinent laboratory and imaging test results that were available during this visit with the patient were reviewed by me and considered in my medical decision making (see chart for details).

## 2018-05-29 ENCOUNTER — Other Ambulatory Visit: Payer: Self-pay | Admitting: Neurology

## 2018-05-29 DIAGNOSIS — Z9989 Dependence on other enabling machines and devices: Secondary | ICD-10-CM

## 2018-05-29 DIAGNOSIS — G20 Parkinson's disease: Secondary | ICD-10-CM

## 2018-05-29 DIAGNOSIS — R413 Other amnesia: Secondary | ICD-10-CM

## 2018-05-29 DIAGNOSIS — R351 Nocturia: Secondary | ICD-10-CM

## 2018-05-29 DIAGNOSIS — M7989 Other specified soft tissue disorders: Secondary | ICD-10-CM

## 2018-05-29 DIAGNOSIS — G4733 Obstructive sleep apnea (adult) (pediatric): Secondary | ICD-10-CM

## 2018-06-26 ENCOUNTER — Telehealth: Payer: Self-pay | Admitting: Neurology

## 2018-06-26 NOTE — Telephone Encounter (Signed)
Pt husband-Binford,Frank(on DPR from [phone] has called to say pt's carbidopa-levodopa (SINEMET CR) 50-200 MG tablet was not called into  [facility] - [name], [place] - [address] [phone] (Phone) [phone] (Fax)     It is needed, pt husband is asking for refill.  Pt 6 month f/u is scheduled for 09-13-18

## 2018-06-26 NOTE — Telephone Encounter (Signed)
I called [facility]. Pt has refills on file and is able to pick a refill up.  I called pt to discuss. No answer, left a message asking them to call me back. If pt or husband call back, please inform them to contact [facility] for the refill.

## 2018-09-10 ENCOUNTER — Encounter: Payer: Self-pay | Admitting: Neurology

## 2018-09-13 ENCOUNTER — Ambulatory Visit: Payer: Medicare Other | Admitting: Neurology

## 2018-09-13 ENCOUNTER — Encounter: Payer: Self-pay | Admitting: Neurology

## 2018-09-13 VITALS — BP 155/76 | HR 77

## 2018-09-13 DIAGNOSIS — G4733 Obstructive sleep apnea (adult) (pediatric): Secondary | ICD-10-CM | POA: Diagnosis not present

## 2018-09-13 DIAGNOSIS — G20 Parkinson's disease: Secondary | ICD-10-CM

## 2018-09-13 DIAGNOSIS — R413 Other amnesia: Secondary | ICD-10-CM | POA: Diagnosis not present

## 2018-09-13 DIAGNOSIS — Z9989 Dependence on other enabling machines and devices: Secondary | ICD-10-CM

## 2018-09-13 MED ORDER — CARBIDOPA-LEVODOPA ER 50-200 MG PO TBCR
1.0000 | EXTENDED_RELEASE_TABLET | Freq: Every day | ORAL | 3 refills | Status: AC
Start: 2018-09-13 — End: ?

## 2018-09-13 NOTE — Progress Notes (Signed)
Subjective:    Patient ID: Carol Mckee is a 77 y.o. female.  HPI     Interim history:   Ms. Carico is a very pleasant 77 year old ambidextrous female with an underlying medical history of lumbar spinal stenosis, hip pain, status post hiatal hernia surgery, kidney stone surgery, bladder surgery, hysterectomy, breast biopsy, tonsillectomy and adenoidectomy, who presents for followup consultation of her advanced, right-sided predominant Parkinson's disease, complicated by memory loss, LBP, bladder incontinence, sleep d/o including OSA, and overall deconditioning secondary to progression and advancing age. She is accompanied by her husband again today. I last saw her on 03/13/2018, at which time she reported that she was taking Sinemet 2 pills 7 times a day consistently but the CR not every night. She had more freezing spells. She was no longer on hydrocodone. She had also stopped taking her Cymbalta. I suggested she consistently take her Sinemet CR. She was encouraged to try to use her CPAP for sleep apnea.  Today, 09/13/2018: I reviewed her last CPAP compliance data for the past 90 days from 06/13/2018 through 09/10/2018, during which time she used her CPAP about 60 out of 90 days but no nights above 4 hours, average usage of 37 minutes only. She reports having trouble maintaining sleep. Constipation is under control on MiraLAX daily. She sometimes misses a dose during the day of her Sinemet. She has not been consistently taking the CR. They were able to visit their daughter in [place], they had a good time.  The patient's allergies, current medications, family history, past medical history, past social history, past surgical history and problem list were reviewed and updated as appropriate.    Previously (copied from previous notes for reference):    I saw her on 11/07/2017, at which time she was having ongoing difficulty with CPAP at night. She had restarted taking her Sinemet CR as it was  helpful. She was taking Sinemet 2 pills 6 times a day. Memory was stable. I suggested we increase her Sinemet to 2 pills 7 times a day.      I reviewed her CPAP compliance data from 10/04/2017 through 11/02/2017, which is a total of 30 days, during which time she used her machine 22 days but percent used days greater than 4 hours was 0, indicating poor compliance, average usage of only 1 hour and 42 minutes, residual AHI 3.9 per hour, pressure at 8 cm, leak acceptable generally speaking. In the past 90 days her percent used days greater than 4 hours was again very low, about 2%.   I saw her on 07/04/2017, at which time she was not able to use her CPAP very much. She did not feel that the Sinemet CR was of any help. She had taken a fall and bruised herself, admitted to turning suddenly due to her phone ringing. She tried taking the Sinemet CR in the middle of the night which was also not helpful and we mutually agreed to stop it. She had in the interim increased her immediate release Sinemet to 2 pills 6 times a day and I suggested we continue with this.     I saw her on 03/02/2017, at which time she had not been using her CPAP for a month or 2 prior. She felt that the pressure was too high. She had a lot of air leakage as well. Her husband had called in March reporting that her Sinemet was wearing off quicker. I did ask her to increase Sinemet cautiously by alternating 2 pills  with 1-1/2 pills for a total of 6 doses per day. Memory and mood were stable. She did have more swelling of the lower extremities. Thankfully, she had no recent falls. I suggested we continue with her medication regimen. With the exception that we add a Sinemet CR at bedtime. I also suggested we reduced her CPAP pressure to 12 cm for better tolerance and that she meet with our sleep lab manager Robin for her mask issues.   Her husband called in the interim in May 2018 wondering if we could increase her Sinemet further, as previously  discussed. I suggested we increase her Sinemet to 2 pills 6 times a day.   I saw her on 11/02/2016, at which time she reported feeling stable. Unfortunately, she had complications with UTI and urosepsis which led to hospitalization in July 2017. She also had admission in September 2017 for kidney stones and lithotripsy. She had lost weight. She was still using her CPAP regularly but was more bothered by it during the night. She was taking hydrocodone 4 times a day. I suggested we maintain Sinemet at 1-1/2 pills 6 times a day. Her memory scores were good, stable.   I reviewed her CPAP compliance data from the past 90 days, during which time she used her CPAP 51 days and percent used days greater than 4 hours was 14% only.   I saw her on 06/28/2016, at which time she reported ongoing low back pain, was taking hydrocodone 1 pill in the morning and in the evening and half a pill 4 times a day in between, was followed by a new pain management doctor. She had a hospitalization in the interim secondary to altered mental status and was found to have a urinary tract infection. She was admitted to the hospital on 05/26/2016 and discharged on 06/02/2016, was found to have urosepsis, treated with anti-biotics IV. She had a head CT without contrast upon admission on 05/26/2016 and I reviewed the findings: IMPRESSION: Image quality degraded by motion  Left frontal hypodensities could represent acute or chronic ischemia. Correlate with symptoms and MRI. Progressive chronic ischemic changes in the left basal ganglia. Patient and her family declined an MRI brain as I understand. Finished HH therapy.   I saw her on 02/25/2016, at which time she was compliant with CPAP therapy. She reported pain while turning in bed, nocturia, no recent UTI. She sees a Dealer in [place]. Memory was stable. She had no recent fall. She needed new CPAP supplies. Her MMSE was 29/30, CDT: 4/4, AFT: 10/min at the time. I suggested we try to  increase Sinemet to 1-1/2 pills 6 times a day, every 3 hours, starting at 5 AM.   I reviewed her CPAP compliance data from 05/25/2016 through 06/23/2016 which is a total of 30 days during which time she used her machine 23 days with percent used days greater than 4 hours at 63%, indicating suboptimal compliance because of a gap of one week in June. Average usage of 5 hours and 8 minutes, residual AHI borderline at 6.7, leak at times high with the 95th percentile at 29.2 L/m at a pressure of 15 cm with EPR of 3.   I saw her on 08/28/2015, at which time she reported ongoing issues with low back pain. She also had right shoulder pain. She had severe limitation in her range of motion. She was using a 2 wheeled walker. She was sleeping and in hospital bed. In the early morning she would shift into  a lift chair. She would have to get up multiple times at night for bathroom breaks, up to 6 times per night, her husband was having more health issues himself of the past 1 year. She was trying to be compliant with CPAP therapy but AHI and leak were both high. I suggested she continue with Sinemet at the current dose, 1-1/2 pills 5 times a day.   I reviewed her CPAP compliance data from 01/25/2016 through 02/23/2016 which is a total of 30 days during which time she used her machine every night with 100% compliance, average usage of 9 hours and 43 minutes, residual AHI 26.7 per hour, leaked high at 49.8 L/m for the 95th percentile on a pressure of 14 cm with EPR of 3. Residual apneas appeared to be obstructive in nature.   I saw her on 02/25/2015, at which time she reported that she was continuing to struggle with the CPAP mask. She had trouble closing her mouth. She was using a fullface mask with a chinstrap. Unfortunately, she had fallen from her recliner in December. She had been in her lift chair and fell asleep and accidentally pushed the control button. She fell forward onto her face. She did not have any LOC or  residual headaches. She was on 7 1/2 pills of Sinemet daily. She was having ongoing issues with low back pain. In the past she had injection treatment with variable results, under Dr. Francesco Runner for this. She was on hydrocodone 3 times a day. She had nocturia multiple times a night which was very bothersome to her. She felt her memory and mood were stable.    I reviewed her CPAP compliance data from 07/27/2015 through 08/25/2015 which is a total of 30 days during which time she used her machine every night, with percent used days greater than 4 hours at 100%, indicating superb compliance with an average usage of 10 hours and 27 minutes, residual AHI high at 27.1 per hour, leak I with the 95th percentile at 54.5 L/m on a pressure of 14 cm with EPR of 3. Looking at her AHI breakdown, it looks like her residual sleep disordered breathing is primarily obstructive in nature. She was supposed to switch to AutoPap therapy but appears to be still on the same setting of 14 cm.   I saw her on 08/26/2014, at which time she had some residual snoring according to her husband. She was using a fullface mask. Parkinson's-wise, she felt stable. I changed her from AutoPap to a set pressure of 14 cm.    I reviewed her CPAP compliance data from 01/25/2015 through 02/23/2015 which is a total of 30 days during which time she used her machine every night with percent used days greater than 4 hours at 100%, indicating superb compliance on her behalf with an average usage of 9 hours and 34 minutes and pressure has now been changed to 14 cm with EPR of 3 but AHI was suboptimal at 14.4, probably due to very high leak with the 95th percentile of leak at 78.6 L/m.   I saw her on 05/02/2014, at which time I talked her about her recent sleep study results and her CPAP treatment. She had done well with that. I changed her from AutoPap to CPAP of 14 cm. She changed from a nasal mask to a full face mask. I asked her to not drive anymore. I  asked her to continue with her Parkinson's medications. I changed the timing to 6, 10, 2, 6  PM and 10 PM and I encouraged her to continue using CPAP regularly.   I reviewed her compliance data from 07/24/2014 through 08/22/2014 which is a total of 30 days during which time she was 100% compliant. Her residual AHI was unfortunately high at 23.5. 95th percentile pressure was 12.8 but leak was very high at times. She is supposed to be on CPAP but is still on AutoPap from 8-13 with EPR of 3.   I saw her on 02/21/2014, at which time we talked about her signs and symptoms concerning for sleep apnea and I suggested a sleep study. I also advised her to try to reduce sedating medications including her Valium as well as her hydrocodone. She was advised to use her walker at all times. She felt that physical therapy was helpful. She was asking if she could drive. She had been to the [facility] office, and was discouraged from driving. She sees Dr. Francesco Runner in [place], [place] for pain. She had been taking norco 1 pill tid and 1/2 pill bid, alternating. She is taking C/L 1 1/2 pills 5 times a day.   She has sleep study on 02/27/2014 and went over her test results with her in detail today. Her baseline sleep efficiency was 100% with 100% of stage II sleep only. She had no significant EKG changes or periodic leg movements of sleep. Her total AHI was high at 54.5 per hour. Baseline oxygen saturation was 94%, nadir was 86%. She was titrated on CPAP from 5-8 centimeters of water pressure. She was noted to have central apneas. She was noted to have mouth opening. On a pressure of 8 cm a residual AHI was 6.5 per hour. She had no REM sleep. I placed the patient on CPAP of 9 cm. 03/20/2014 to 04/08/2014, which is a total of 21 days, during which time the patient used CPAP every day. The average usage for all days was 10 hours and 10 minutes. The percent used days greater than 4 hours was 100 %, indicating superb compliance. The residual AHI was  unfortunately high at 22.4 per hour, indicating an inadequate treatment pressure of 9 cwp with EPR of 3. Her residual AHI is primarily obstructive in nature per AHI breakdown. Air leak from the mask was also elevated with 38.1 L per minute at the 95th percentile. Earlier this month based on these results I placed her on AutoPap as opposed to CPAP.    I reviewed the patient's PAP compliance data from 04/16/2014 to 05/01/2014, which is a total of 16 days, during which time the patient used CPAP every day. The average usage for all days was 10 hours and 12 minutes. The percent used days greater than 4 hours was 100 %, indicating superb compliance. The residual AHI was unfortunately elevated at 14.9 per hour. The pressure setting is from 8-13 cm. 95th percentile pressure was 12.9. Her leak was at times high.   I saw her on 10/04/2013, at which time I did not make changes to her medications but made a referral to physical therapy and she wanted to stay locally in [place], [place], for this. We talked about potentially starting her on Exelon for memory loss.   I first met her on 05/01/2013, which time I suggested that she increase her Sinemet to 1-1/2 pills 5 times a day. I also reinforced the importance of using her walker at all times as she was at fall risk. She called back in August 2014 and reported an improvement in her  symptoms.   She previously followed with Dr. Morene Antu and was last seen by him on 01/01/2013, at which time he felt that she would benefit from physical therapy due to her lumbar spine stenosis with chronic lower back pain. He also increased her carbidopa-levodopa to 1-1/2 pills at 5 AM, one pill at 9 AM, one pill at 1 PM, 1 1/2 pills at 5 PM and one pill at 9 PM. Her falls assessment tool score at the time was 18.   She has an approximately 12-year history of Parkinson's disease treated with carbidopa-levodopa 25-100 mg strength 5 times daily. She developed lower back pain and was treated with  hydrocodone, oxycodone and fentanyl patch. She also received epidural steroid injections. She had an EEG in May 2011, which did not show any epileptiform discharges. MRI L-spine in March 2013 showed scoliosis, and severe degenerative joint disease. She has had jerking movements in her legs. This does not occur while lying down but mostly with sitting or standing. She has developed bladder incontinence. In January 2014 her MMSE was 22, clock drawing was 4, animal fluency was 8.   She has a hospital bed with rails up at night. He helps her to use a bed pan. She has to urinate 2-5 times per night. She can walk about 15 feet at a time with 2 wheeled walker. She denies VH or AH and has not had dyskinesias. She lives close to [place] and had PT there. She had epidural injection at [facility] in the past, and at [facility] with good relief.  Her Past Medical History Is Significant For: Past Medical History:  Diagnosis Date  . Abnormality of gait   . Chronic kidney disease   . Edema   . Fatigue   . Other chronic pulmonary heart diseases   . Paralysis agitans ([place])   . Parkinson disease ([place])   . Parkinson's disease ([place]) 10/04/2013  . Pyelonephritis 06/2016   pt states hospitalized for kidney infection/sepsis  . Sleep apnea    pt does use cpap  . Spinal stenosis   . Stiffness of joint, not elsewhere classified,  shoulder region     Her Past Surgical History Is Significant For: Past Surgical History:  Procedure Laterality Date  . ABDOMINAL HYSTERECTOMY    . COLONOSCOPY     x3  . CYSTOSCOPY WITH STENT PLACEMENT Left 07/03/2016   Procedure: CYSTOSCOPY WITH STENT PLACEMENT;  Surgeon: Raynelle Bring, MD;  Location: [facility];  Service: Urology;  Laterality: Left;    Her Family History Is Significant For: No family history on file.  Her Social History Is Significant For: Social History   Socioeconomic History  . Marital status: Married    Spouse name: Pilar Plate  . Number of children: 3  . Years of education:  39  . Highest education level: Not on file  Occupational History  . Occupation: RETIRED  Social Needs  . Financial resource strain: Not on file  . Food insecurity:    Worry: Not on file    Inability: Not on file  . Transportation needs:    Medical: Not on file    Non-medical: Not on file  Tobacco Use  . Smoking status: Never Smoker  . Smokeless tobacco: Never Used  Substance and Sexual Activity  . Alcohol use: No  . Drug use: No  . Sexual activity: Not on file  Lifestyle  . Physical activity:    Days per week: Not on file    Minutes per session: Not  on file  . Stress: Not on file  Relationships  . Social connections:    Talks on phone: Not on file    Gets together: Not on file    Attends religious service: Not on file    Active member of club or organization: Not on file    Attends meetings of clubs or organizations: Not on file    Relationship status: Not on file  Other Topics Concern  . Not on file  Social History Narrative   Patient is right handed.resides in a home with husband    Her Allergies Are:  Allergies  Allergen Reactions  . Azithromycin Diarrhea  . Lyrica [Pregabalin]     Drops blood pressure  . Tizanidine Other (See Comments)    hypotension  . Erythromycin Diarrhea  :   Her Current Medications Are:  Outpatient Encounter Medications as of 09/13/2018  Medication Sig  . carbidopa-levodopa (SINEMET CR) 50-200 MG tablet Take 1 tablet by mouth at bedtime.  . carbidopa-levodopa (SINEMET IR) 25-100 MG tablet 2 pills 7 times a day, at 5am, 8am, 11am, 2pm, 5pm, 8pm and 10:30 PM.  . carbidopa-levodopa (SINEMET IR) 25-100 MG tablet TAKE 2 TABS 7 TIMES DAILY AT 5AM, 8AM, 11AM, 2 PM, 5PM, 8PM AND 10:30PM.  . Cholecalciferol (VITAMIN D3) 5000 UNITS CAPS Take 1 capsule by mouth daily.  . furosemide (LASIX) 20 MG tablet Take 20 mg by mouth daily as needed.  . Glucosamine HCl (GLUCOSAMINE PO) Take 1,000 mg by mouth.  Marland Kitchen KRILL OIL PO Take 1 capsule by mouth daily.   . magnesium oxide (MAG-OX) 400 MG tablet Take 400 mg by mouth daily.  . Multiple Vitamin (MULTIVITAMIN) tablet Take 1 tablet by mouth daily.  . polyethylene glycol (MIRALAX / GLYCOLAX) packet Take 17 g by mouth daily.  . vitamin E 400 UNIT capsule Take 400 Units by mouth daily.  . VOLTAREN 1 % GEL Apply 1 application topically daily as needed (for pain).   . [DISCONTINUED] DULoxetine (CYMBALTA) 30 MG capsule Take 30 mg by mouth daily.    No facility-administered encounter medications on file as of 09/13/2018.   :  Review of Systems:  Out of a complete 14 point review of systems, all are reviewed and negative with the exception of these symptoms as listed below:  Review of Systems  Neurological:       Pt presents today for follow up on her PD, memory, and cpap. Pt reports that she doesn't put her cpap back on after her first bathroom break at night. Pt is asking if she can increase her magnesium to 855m and get a refill on lasix.    Objective:  Neurological Exam  Physical Exam Physical Examination:   Vitals:   09/13/18 1510  BP: (!) 155/76  Pulse: 77    General Examination: The patient is a very pleasant 77y.o. female in no acute distress. She appears well-developed and well-nourished and well groomed.   HEENT:Normocephalic, atraumatic, pupils are equal, round and reactive to light and accommodation. She wears corrective eyeglasses. She has difficulty tracking, no nystagmus is noted. She has a moderate decrease in eye blink rate. Hearing is impaired. Face is symmetric with moderate facial masking. Airway examination is unchanged. She has no significant sialorrhea today. Speech is moderately hypophonic. She has very minimal dysarthria. All stable.  Chest:Clear to auscultation without wheezing, rhonchi or crackles noted.  Heart:S1+S2+0, regular and normal without murmurs, rubs or gallops noted.   Abdomen:Soft, non-tender and non-distended with normal  bowel sounds  appreciated on auscultation.  Extremities:There is1-2+pitting edema in the distal lower extremities bilaterally. Right side more pronounced than left,wearing compression stockings up to knees, all stable.  Skin: Warm and dry without trophic changes noted.   Musculoskeletal: exam reveals no obvious joint deformities, tenderness or joint swelling or erythema, with the exception of unchanged severe decrease and active range of motion in her right shoulder.  Neurologically:  Mental status: The patient is awake, alert and oriented in all 4 spheres. Herimmediate and remote memory, attention, language skills and fund of knowledge are mildly impaired. There is no evidence of aphasia, agnosia, apraxia or anomia. Thought process is linear. Mood is normaland affect is normal.   On 08/26/2014: MMSE 25/30, CDT 2/4, AFT: 9.   On 02/25/2015: MMSE: 27/30, CDT: 3/4, AFT: 8/min, GDS: 2/15.  On 08/28/2015: MMSE: 26/30, CDT: 3/4, AFT: 5/min.   On 02/25/2016: MMSE: 29/30, CDT: 4/4, AFT: 10/min.  On 11/02/2016: MMSE: 29/30, CDT: 4/4, AFT: 13/min.  On7/30/2018: MMSE: 27/30, CDT: 4/4, AFT: 14/min.  On 03/13/2018: MMSE: 25/30, CDT: 4/4, AFT: 15/min.  On 09/13/2018: MMSE: 29/30, CDT: 3/4, AFT: 9/min.   Cranial nerves II - XII are as described above under HEENT exam. In addition: shoulder shrug is normal with unequal, right shoulder is lower, stable. Motor exam: Normal bulk, strength globally of 4 out of 5, significant limitation in range of motion of the right shoulder.She has no significant dyskinesias. Tone is increased throughout, right more than left. She has moderate bradykinesia. She hasa resting tremor in all 4 extremities today, more noticeable in the upper extremities. Fine motor skills are globally impaired in the moderate to severe range, more so on the right.Romberg is not testable safely,Reflexes are 1+ in the upper extremities and trace in both knees, absent in the ankles.I did  not feel it was safe for her to stand or walk for me, she has no walking aid with her today, situated in her wheelchair.Sensory exam: intact to light touch.   Assessment and plan:  In summary, Lily Velasquez a very pleasant 77 year old female with an underlyingcomplexmedical history of lumbar spinal stenosis, hip pain, status post hiatal hernia surgery, kidney stone surgery, bladder surgery, hysterectomy, breast biopsy, tonsillectomy and adenoidectomy, recurrent UTI, right shoulder injury, sleep apnea, who presents for follow-up consultation of her advanced right-sided predominant Parkinson's disease, complicated by mild memory loss, chronic pain, bladder dysfunction, deconditioning, lower extremity swelling,chronic constipation. She has hadtrouble keeping her CPAP on and with sleep maintenance. She does seem to try most nights. She is commended for trying. She is encouraged to use her CPAP as best as she can. She is encouraged to continue with Sinemet 2 pills 7 times a day and take the Sinemet CR consistently at 10:30 PM. She has been off of hydrocodone and also stopped her Cymbalta. We talked about trying to stay healthy with good nutrition, good hydration and proper rest. She is advised to try to mobilize as best she can.she goes to physical therapy twice a week. I will see her back in about 6 months, sooner if needed. I answered all their questions today and the patient and her husband were in agreement.  I spent 25 minutes in total face-to-face time with the patient, more than 50% of which was spent in counseling and coordination of care, reviewing test results, reviewing medication and discussing or reviewing the diagnosis of PD, its prognosis and treatment options. Pertinent laboratory and imaging test results that were available during this visit  with the patient were reviewed by me and considered in my medical decision making (see chart for details).

## 2018-09-13 NOTE — Patient Instructions (Addendum)
Please take your Sinemet long acting consistently at 10:30 PM each night.   Please do the best you can with the CPAP.   Please keep taking your sinemet 2 pills 7 times a day, 3 hourly. If you forget a dose, you can still take it, if it's within an hour of the missed dose time.

## 2019-01-21 ENCOUNTER — Other Ambulatory Visit: Payer: Self-pay | Admitting: Neurology

## 2019-01-21 DIAGNOSIS — G20 Parkinson's disease: Secondary | ICD-10-CM

## 2019-02-05 ENCOUNTER — Other Ambulatory Visit: Payer: Self-pay | Admitting: Neurology

## 2019-02-05 ENCOUNTER — Encounter: Payer: Self-pay | Admitting: Neurology

## 2019-02-05 DIAGNOSIS — G20 Parkinson's disease: Secondary | ICD-10-CM

## 2019-02-06 ENCOUNTER — Other Ambulatory Visit: Payer: Self-pay | Admitting: Neurology

## 2019-02-06 DIAGNOSIS — G20 Parkinson's disease: Secondary | ICD-10-CM

## 2019-02-12 ENCOUNTER — Other Ambulatory Visit: Payer: Self-pay | Admitting: Neurology

## 2019-02-12 DIAGNOSIS — G20 Parkinson's disease: Secondary | ICD-10-CM

## 2019-03-20 ENCOUNTER — Ambulatory Visit: Payer: Medicare Other | Admitting: Neurology

## 2019-03-21 ENCOUNTER — Ambulatory Visit: Payer: Medicare Other | Admitting: Neurology

## 2019-03-27 ENCOUNTER — Other Ambulatory Visit: Payer: Self-pay | Admitting: Neurology

## 2019-03-27 DIAGNOSIS — G4733 Obstructive sleep apnea (adult) (pediatric): Secondary | ICD-10-CM

## 2019-03-27 DIAGNOSIS — R413 Other amnesia: Secondary | ICD-10-CM

## 2019-03-27 DIAGNOSIS — M7989 Other specified soft tissue disorders: Secondary | ICD-10-CM

## 2019-03-27 DIAGNOSIS — G20 Parkinson's disease: Secondary | ICD-10-CM

## 2019-03-27 DIAGNOSIS — Z9989 Dependence on other enabling machines and devices: Secondary | ICD-10-CM

## 2019-03-27 DIAGNOSIS — R351 Nocturia: Secondary | ICD-10-CM

## 2019-04-05 ENCOUNTER — Telehealth: Payer: Self-pay | Admitting: Neurology

## 2019-04-05 NOTE — Telephone Encounter (Signed)
Due to current COVID 19 pandemic, our office is severely reducing in office visits until further notice, in order to minimize the risk to our patients and healthcare providers.   Called patient and spoke with husband regarding her 5/7 appointment. I offered a virtual visit and Mr. Cubberley informed me that they do not have internet access. I then offered a telephone visit which he agreed to on behalf of patient. I advised that they will receive 3 calls from RN, front office staff and then doctor. He verbalized understanding.  Pt understands that although there may be some limitations with this type of visit, we will take all precautions to reduce any security or privacy concerns.  Pt understands that this will be treated like an in office visit and we will file with pt's insurance, and there may be a patient responsible charge related to this service.

## 2019-04-10 NOTE — Telephone Encounter (Signed)
I called pt. Pt's meds, allergies, and PMH were updated.  Requip was added by pt's PCP to help with RLS.

## 2019-04-11 NOTE — Telephone Encounter (Signed)
Pt returned my call. MMSE: 21/22, limited via phone call. AFT: 10.  Pt reports that she stopped using her cpap because she feels better without it.

## 2019-04-11 NOTE — Telephone Encounter (Signed)
I called pt to complete an MMSE. No answer, left a message asking her to call me back.

## 2019-04-12 ENCOUNTER — Ambulatory Visit (INDEPENDENT_AMBULATORY_CARE_PROVIDER_SITE_OTHER): Payer: Medicare Other | Admitting: Neurology

## 2019-04-12 ENCOUNTER — Other Ambulatory Visit: Payer: Self-pay

## 2019-04-12 ENCOUNTER — Encounter: Payer: Self-pay | Admitting: Neurology

## 2019-04-12 DIAGNOSIS — G20 Parkinson's disease: Secondary | ICD-10-CM

## 2019-04-12 NOTE — Patient Instructions (Signed)
Given over the phone during today's phone call virtual visit.  

## 2019-04-12 NOTE — Progress Notes (Signed)
Interim history:   Carol Mckee is a very pleasant 78 year old ambidextrous female with an underlying medical history of lumbar spinal stenosis, hip pain, status post hiatal hernia surgery, kidney stone surgery, bladder surgery, hysterectomy, breast biopsy, tonsillectomy and adenoidectomy, who presents for a virtual, phone based followup consultation of her advanced, right-sided predominant Parkinson's disease, complicated by memory loss, LBP, bladder incontinence, sleep d/o including OSA, and overall deconditioning secondary to progression and advancing age. She is accompanied by her husband today, who is on the second line and contributes with hx and questions. I last saw her on 09/13/2018, at which time she was not consistent with her CPAP.  She had trouble maintaining sleep.  She had no recent fall thankfully.  She was on Sinemet at her stable dose.  She was able to visit her daughter in [place] and enjoyed the visit.  Her constipation was under control with MiraLAX.  Memory was stable.    Today, 04/12/2019: Please also see below for virtual visit documentation.      The patient's allergies, current medications, family history, past medical history, past social history, past surgical history and problem list were reviewed and updated as appropriate.    Previously (copied from previous notes for reference):      I saw her on 03/13/2018, at which time she reported that she was taking Sinemet 2 pills 7 times a day consistently but the CR not every night. She had more freezing spells. She was no longer on hydrocodone. She had also stopped taking her Cymbalta. I suggested she consistently take her Sinemet CR. She was encouraged to try to use her CPAP for sleep apnea.    I saw her on 11/07/2017, at which time she was having ongoing difficulty with CPAP at night. She had restarted taking her Sinemet CR as it was helpful. She was taking Sinemet 2 pills 6 times a day. Memory was stable. I suggested we  increase her Sinemet to 2 pills 7 times a day.      I reviewed her CPAP compliance data from 10/04/2017 through 11/02/2017, which is a total of 30 days, during which time she used her machine 22 days but percent used days greater than 4 hours was 0, indicating poor compliance, average usage of only 1 hour and 42 minutes, residual AHI 3.9 per hour, pressure at 8 cm, leak acceptable generally speaking. In the past 90 days her percent used days greater than 4 hours was again very low, about 2%.   I saw her on 07/04/2017, at which time she was not able to use her CPAP very much. She did not feel that the Sinemet CR was of any help. She had taken a fall and bruised herself, admitted to turning suddenly due to her phone ringing. She tried taking the Sinemet CR in the middle of the night which was also not helpful and we mutually agreed to stop it. She had in the interim increased her immediate release Sinemet to 2 pills 6 times a day and I suggested we continue with this.     I saw her on 03/02/2017, at which time she had not been using her CPAP for a month or 2 prior. She felt that the pressure was too high. She had a lot of air leakage as well. Her husband had called in March reporting that her Sinemet was wearing off quicker. I did ask her to increase Sinemet cautiously by alternating 2 pills with 1-1/2 pills for a total of 6  doses per day. Memory and mood were stable. She did have more swelling of the lower extremities. Thankfully, she had no recent falls. I suggested we continue with her medication regimen. With the exception that we add a Sinemet CR at bedtime. I also suggested we reduced her CPAP pressure to 12 cm for better tolerance and that she meet with our sleep lab manager Robin for her mask issues.   Her husband called in the interim in May 2018 wondering if we could increase her Sinemet further, as previously discussed. I suggested we increase her Sinemet to 2 pills 6 times a day.   I saw her on  11/02/2016, at which time she reported feeling stable. Unfortunately, she had complications with UTI and urosepsis which led to hospitalization in July 2017. She also had admission in September 2017 for kidney stones and lithotripsy. She had lost weight. She was still using her CPAP regularly but was more bothered by it during the night. She was taking hydrocodone 4 times a day. I suggested we maintain Sinemet at 1-1/2 pills 6 times a day. Her memory scores were good, stable.   I reviewed her CPAP compliance data from the past 90 days, during which time she used her CPAP 51 days and percent used days greater than 4 hours was 14% only.   I saw her on 06/28/2016, at which time she reported ongoing low back pain, was taking hydrocodone 1 pill in the morning and in the evening and half a pill 4 times a day in between, was followed by a new pain management doctor. She had a hospitalization in the interim secondary to altered mental status and was found to have a urinary tract infection. She was admitted to the hospital on 05/26/2016 and discharged on 06/02/2016, was found to have urosepsis, treated with anti-biotics IV. She had a head CT without contrast upon admission on 05/26/2016 and I reviewed the findings: IMPRESSION: Image quality degraded by motion  Left frontal hypodensities could represent acute or chronic ischemia. Correlate with symptoms and MRI. Progressive chronic ischemic changes in the left basal ganglia. Patient and her family declined an MRI brain as I understand. Finished HH therapy.   I saw her on 02/25/2016, at which time she was compliant with CPAP therapy. She reported pain while turning in bed, nocturia, no recent UTI. She sees a Dealer in [place]. Memory was stable. She had no recent fall. She needed new CPAP supplies. Her MMSE was 29/30, CDT: 4/4, AFT: 10/min at the time. I suggested we try to increase Sinemet to 1-1/2 pills 6 times a day, every 3 hours, starting at 5 AM.   I  reviewed her CPAP compliance data from 05/25/2016 through 06/23/2016 which is a total of 30 days during which time she used her machine 23 days with percent used days greater than 4 hours at 63%, indicating suboptimal compliance because of a gap of one week in June. Average usage of 5 hours and 8 minutes, residual AHI borderline at 6.7, leak at times high with the 95th percentile at 29.2 L/m at a pressure of 15 cm with EPR of 3.   I saw her on 08/28/2015, at which time she reported ongoing issues with low back pain. She also had right shoulder pain. She had severe limitation in her range of motion. She was using a 2 wheeled walker. She was sleeping and in hospital bed. In the early morning she would shift into a lift chair. She would have to get  up multiple times at night for bathroom breaks, up to 6 times per night, her husband was having more health issues himself of the past 1 year. She was trying to be compliant with CPAP therapy but AHI and leak were both high. I suggested she continue with Sinemet at the current dose, 1-1/2 pills 5 times a day.   I reviewed her CPAP compliance data from 01/25/2016 through 02/23/2016 which is a total of 30 days during which time she used her machine every night with 100% compliance, average usage of 9 hours and 43 minutes, residual AHI 26.7 per hour, leaked high at 49.8 L/m for the 95th percentile on a pressure of 14 cm with EPR of 3. Residual apneas appeared to be obstructive in nature.   I saw her on 02/25/2015, at which time she reported that she was continuing to struggle with the CPAP mask. She had trouble closing her mouth. She was using a fullface mask with a chinstrap. Unfortunately, she had fallen from her recliner in December. She had been in her lift chair and fell asleep and accidentally pushed the control button. She fell forward onto her face. She did not have any LOC or residual headaches. She was on 7 1/2 pills of Sinemet daily. She was having ongoing  issues with low back pain. In the past she had injection treatment with variable results, under Dr. Francesco Runner for this. She was on hydrocodone 3 times a day. She had nocturia multiple times a night which was very bothersome to her. She felt her memory and mood were stable.    I reviewed her CPAP compliance data from 07/27/2015 through 08/25/2015 which is a total of 30 days during which time she used her machine every night, with percent used days greater than 4 hours at 100%, indicating superb compliance with an average usage of 10 hours and 27 minutes, residual AHI high at 27.1 per hour, leak I with the 95th percentile at 54.5 L/m on a pressure of 14 cm with EPR of 3. Looking at her AHI breakdown, it looks like her residual sleep disordered breathing is primarily obstructive in nature. She was supposed to switch to AutoPap therapy but appears to be still on the same setting of 14 cm.   I saw her on 08/26/2014, at which time she had some residual snoring according to her husband. She was using a fullface mask. Parkinson's-wise, she felt stable. I changed her from AutoPap to a set pressure of 14 cm.    I reviewed her CPAP compliance data from 01/25/2015 through 02/23/2015 which is a total of 30 days during which time she used her machine every night with percent used days greater than 4 hours at 100%, indicating superb compliance on her behalf with an average usage of 9 hours and 34 minutes and pressure has now been changed to 14 cm with EPR of 3 but AHI was suboptimal at 14.4, probably due to very high leak with the 95th percentile of leak at 78.6 L/m.   I saw her on 05/02/2014, at which time I talked her about her recent sleep study results and her CPAP treatment. She had done well with that. I changed her from AutoPap to CPAP of 14 cm. She changed from a nasal mask to a full face mask. I asked her to not drive anymore. I asked her to continue with her Parkinson's medications. I changed the timing to 6, 10,  2, 6 PM and 10 PM and I encouraged her  to continue using CPAP regularly.   I reviewed her compliance data from 07/24/2014 through 08/22/2014 which is a total of 30 days during which time she was 100% compliant. Her residual AHI was unfortunately high at 23.5. 95th percentile pressure was 12.8 but leak was very high at times. She is supposed to be on CPAP but is still on AutoPap from 8-13 with EPR of 3.   I saw her on 02/21/2014, at which time we talked about her signs and symptoms concerning for sleep apnea and I suggested a sleep study. I also advised her to try to reduce sedating medications including her Valium as well as her hydrocodone. She was advised to use her walker at all times. She felt that physical therapy was helpful. She was asking if she could drive. She had been to the [facility] office, and was discouraged from driving. She sees Dr. Francesco Runner in [place], [place] for pain. She had been taking norco 1 pill tid and 1/2 pill bid, alternating. She is taking C/L 1 1/2 pills 5 times a day.   She has sleep study on 02/27/2014 and went over her test results with her in detail today. Her baseline sleep efficiency was 100% with 100% of stage II sleep only. She had no significant EKG changes or periodic leg movements of sleep. Her total AHI was high at 54.5 per hour. Baseline oxygen saturation was 94%, nadir was 86%. She was titrated on CPAP from 5-8 centimeters of water pressure. She was noted to have central apneas. She was noted to have mouth opening. On a pressure of 8 cm a residual AHI was 6.5 per hour. She had no REM sleep. I placed the patient on CPAP of 9 cm. 03/20/2014 to 04/08/2014, which is a total of 21 days, during which time the patient used CPAP every day. The average usage for all days was 10 hours and 10 minutes. The percent used days greater than 4 hours was 100 %, indicating superb compliance. The residual AHI was unfortunately high at 22.4 per hour, indicating an inadequate treatment pressure of 9 cwp  with EPR of 3. Her residual AHI is primarily obstructive in nature per AHI breakdown. Air leak from the mask was also elevated with 38.1 L per minute at the 95th percentile. Earlier this month based on these results I placed her on AutoPap as opposed to CPAP.    I reviewed the patient's PAP compliance data from 04/16/2014 to 05/01/2014, which is a total of 16 days, during which time the patient used CPAP every day. The average usage for all days was 10 hours and 12 minutes. The percent used days greater than 4 hours was 100 %, indicating superb compliance. The residual AHI was unfortunately elevated at 14.9 per hour. The pressure setting is from 8-13 cm. 95th percentile pressure was 12.9. Her leak was at times high.   I saw her on 10/04/2013, at which time I did not make changes to her medications but made a referral to physical therapy and she wanted to stay locally in [place], [place], for this. We talked about potentially starting her on Exelon for memory loss.   I first met her on 05/01/2013, which time I suggested that she increase her Sinemet to 1-1/2 pills 5 times a day. I also reinforced the importance of using her walker at all times as she was at fall risk. She called back in August 2014 and reported an improvement in her symptoms.   She previously followed with Dr.  Morene Antu and was last seen by him on 01/01/2013, at which time he felt that she would benefit from physical therapy due to her lumbar spine stenosis with chronic lower back pain. He also increased her carbidopa-levodopa to 1-1/2 pills at 5 AM, one pill at 9 AM, one pill at 1 PM, 1 1/2 pills at 5 PM and one pill at 9 PM. Her falls assessment tool score at the time was 18.   She has an approximately 12-year history of Parkinson's disease treated with carbidopa-levodopa 25-100 mg strength 5 times daily. She developed lower back pain and was treated with hydrocodone, oxycodone and fentanyl patch. She also received epidural steroid injections. She  had an EEG in May 2011, which did not show any epileptiform discharges. MRI L-spine in March 2013 showed scoliosis, and severe degenerative joint disease. She has had jerking movements in her legs. This does not occur while lying down but mostly with sitting or standing. She has developed bladder incontinence. In January 2014 her MMSE was 22, clock drawing was 4, animal fluency was 8.   She has a hospital bed with rails up at night. He helps her to use a bed pan. She has to urinate 2-5 times per night. She can walk about 15 feet at a time with 2 wheeled walker. She denies VH or AH and has not had dyskinesias. She lives close to [place] and had PT there. She had epidural injection at [facility] in the past, and at [facility] with good relief. Her Past Medical History Is Significant For: Past Medical History:  Diagnosis Date   Abnormality of gait    Chronic kidney disease    Edema    Fatigue    Other chronic pulmonary heart diseases    Paralysis agitans ([place])    Parkinson disease ([place])    Parkinson's disease ([place]) 10/04/2013   Pyelonephritis 06/2016   pt states hospitalized for kidney infection/sepsis   Sleep apnea    pt does use cpap   Spinal stenosis    Stiffness of joint, not elsewhere classified,  shoulder region     Her Past Surgical History Is Significant For: Past Surgical History:  Procedure Laterality Date   ABDOMINAL HYSTERECTOMY     COLONOSCOPY     x3   CYSTOSCOPY WITH STENT PLACEMENT Left 07/03/2016   Procedure: CYSTOSCOPY WITH STENT PLACEMENT;  Surgeon: Raynelle Bring, MD;  Location: [facility];  Service: Urology;  Laterality: Left;    Her Family History Is Significant For: No family history on file.  Her Social History Is Significant For: Social History   Socioeconomic History   Marital status: Married    Spouse name: Pilar Plate   Number of children: 3   Years of education: 14   Highest education level: Not on file  Occupational History   Occupation: RETIRED    Scientist, product/process development strain: Not on file   Food insecurity:    Worry: Not on file    Inability: Not on file   Transportation needs:    Medical: Not on file    Non-medical: Not on file  Tobacco Use   Smoking status: Never Smoker   Smokeless tobacco: Never Used  Substance and Sexual Activity   Alcohol use: No   Drug use: No   Sexual activity: Not on file  Lifestyle   Physical activity:    Days per week: Not on file    Minutes per session: Not on file   Stress: Not on file  Relationships   Social connections:    Talks on phone: Not on file    Gets together: Not on file    Attends religious service: Not on file    Active member of club or organization: Not on file    Attends meetings of clubs or organizations: Not on file    Relationship status: Not on file  Other Topics Concern   Not on file  Social History Narrative   Patient is right handed.resides in a home with husband    Her Allergies Are:  Allergies  Allergen Reactions   Azithromycin Diarrhea   Lyrica [Pregabalin]     Drops blood pressure   Tizanidine Other (See Comments)    hypotension   Erythromycin Diarrhea  :   Her Current Medications Are:  Outpatient Encounter Medications as of 04/12/2019  Medication Sig   carbidopa-levodopa (SINEMET CR) 50-200 MG tablet Take 1 tablet by mouth at bedtime.   carbidopa-levodopa (SINEMET IR) 25-100 MG tablet TAKE 2 TABS 7 TIMES DAILY AT 5AM, 8AM, 11AM, 2 PM, 5PM, 8PM AND 10:30PM.   carbidopa-levodopa (SINEMET IR) 25-100 MG tablet TAKE 2 TABLETS 7 TIMES A DAY: AT 5AM, 8AM, 11AM, 2PM, 5PM, 8PM, & 10:30PM   Cholecalciferol (VITAMIN D3) 5000 UNITS CAPS Take 1 capsule by mouth daily.   furosemide (LASIX) 20 MG tablet Take 20 mg by mouth daily as needed.   Glucosamine HCl (GLUCOSAMINE PO) Take 1,000 mg by mouth.   KRILL OIL PO Take 1 capsule by mouth daily.   magnesium oxide (MAG-OX) 400 MG tablet Take 400 mg by mouth daily.   Multiple  Vitamin (MULTIVITAMIN) tablet Take 1 tablet by mouth daily.   polyethylene glycol (MIRALAX / GLYCOLAX) packet Take 17 g by mouth daily.   rOPINIRole (REQUIP) 0.5 MG tablet Take 0.5 mg by mouth at bedtime.   vitamin E 400 UNIT capsule Take 400 Units by mouth daily.   VOLTAREN 1 % GEL Apply 1 application topically daily as needed (for pain).    No facility-administered encounter medications on file as of 04/12/2019.   :  Review of Systems:  Out of a complete 14 point review of systems, all are reviewed and negative with the exception of these symptoms as listed below:  Virtual Visit via Telephone Note on 04/12/19:   I connected with Carol Mckee on 04/12/19 at  9:30 AM EDT by telephone and verified that I am speaking with the correct person using two identifiers.   I discussed the limitations, risks, security and privacy concerns of performing an evaluation and management service by telephone and the availability of in person appointments. I also discussed with the patient that there may be a patient responsible charge related to this service. The patient expressed understanding and agreed to proceed.   History of Present Illness: She reports feeling stable.  She has had no recent falls, memory is stable.  She scored 21 out of 22 on the MMSE, modified for phone use.  Her animal fluency was 10/min.  Constipation is under control, she feels it her stamina is not as good, her husband adds that she gets exhausted very easily even after minimal walking.  She has not been using her CPAP, she had no significant usage in the past 3 months or even 6 months.  I reviewed her compliance data.    Observations/Objective: There are no recent vital signs available for my review in her chart. On examination, she appears to be in no acute distress, she is pleasant  and conversant, comprehension is good, some slowness in thinking, also moderate hypophonia noted, slight dysarthria at times.  Assessment and  Plan: In summary, Carol Mckee a very pleasant 78 year old female with an underlyingcomplexmedical history of lumbar spinal stenosis, hip pain, status post hiatal hernia surgery, kidney stone surgery, bladder surgery, hysterectomy, breast biopsy, tonsillectomy and adenoidectomy, recurrent UTI, right shoulder injury, sleep apnea, who presents for a phone based follow-up consultation of her advanced right-sided predominant Parkinson's disease, complicated by mild memory loss, chronic pain, bladder dysfunction, deconditioning, lower extremity swelling,chronic constipation. She has been unable to use her CPAP. She feels stable.  She is encouraged to continue with Sinemet 2 pills for 7 times a day and CR at night.  She is up-to-date with her prescription. I will see her back in about 6 months, sooner if needed. I answered all their questions today and the patient and her husband were in agreement.  Follow Up Instructions:    I discussed the assessment and treatment plan with the patient. The patient was provided an opportunity to ask questions and all were answered. The patient agreed with the plan and demonstrated an understanding of the instructions.   The patient was advised to call back or seek an in-person evaluation if the symptoms worsen or if the condition fails to improve as anticipated.  I provided 12 minutes of non-face-to-face time during this encounter.   Star Age, MD

## 2019-11-06 DEATH — deceased
